# Patient Record
Sex: Male | Born: 1965 | Race: White | Hispanic: No | Marital: Single | Smoking: Former smoker
Health system: Southern US, Community
[De-identification: ages and names within clinical notes are randomized; demographics above are authoritative.]

## PROBLEM LIST (undated history)

## (undated) DIAGNOSIS — B019 Varicella without complication: Secondary | ICD-10-CM

## (undated) DIAGNOSIS — J45909 Unspecified asthma, uncomplicated: Secondary | ICD-10-CM

## (undated) HISTORY — DX: Varicella without complication: B01.9

---

## 2014-02-07 ENCOUNTER — Encounter (HOSPITAL_COMMUNITY): Payer: Self-pay | Admitting: Emergency Medicine

## 2014-02-07 ENCOUNTER — Emergency Department (HOSPITAL_COMMUNITY)
Admission: EM | Admit: 2014-02-07 | Discharge: 2014-02-07 | Disposition: A | Discharge status: Home / Self Care | Payer: BC Managed Care – PPO | Attending: Emergency Medicine | Admitting: Emergency Medicine

## 2014-02-07 DIAGNOSIS — M7989 Other specified soft tissue disorders: Secondary | ICD-10-CM

## 2014-02-07 DIAGNOSIS — L03119 Cellulitis of unspecified part of limb: Principal | ICD-10-CM

## 2014-02-07 DIAGNOSIS — L0390 Cellulitis, unspecified: Secondary | ICD-10-CM

## 2014-02-07 DIAGNOSIS — J45909 Unspecified asthma, uncomplicated: Secondary | ICD-10-CM | POA: Insufficient documentation

## 2014-02-07 DIAGNOSIS — Z87891 Personal history of nicotine dependence: Secondary | ICD-10-CM | POA: Insufficient documentation

## 2014-02-07 DIAGNOSIS — L02419 Cutaneous abscess of limb, unspecified: Secondary | ICD-10-CM | POA: Insufficient documentation

## 2014-02-07 HISTORY — DX: Unspecified asthma, uncomplicated: J45.909

## 2014-02-07 LAB — CBC WITH DIFFERENTIAL/PLATELET
BASOS ABS: 0 10*3/uL (ref 0.0–0.1)
BASOS PCT: 1 % (ref 0–1)
Eosinophils Absolute: 0.1 10*3/uL (ref 0.0–0.7)
Eosinophils Relative: 1 % (ref 0–5)
HEMATOCRIT: 32 % — AB (ref 39.0–52.0)
Hemoglobin: 10.3 g/dL — ABNORMAL LOW (ref 13.0–17.0)
LYMPHS PCT: 21 % (ref 12–46)
Lymphs Abs: 1.7 10*3/uL (ref 0.7–4.0)
MCH: 28.1 pg (ref 26.0–34.0)
MCHC: 32.2 g/dL (ref 30.0–36.0)
MCV: 87.2 fL (ref 78.0–100.0)
MONO ABS: 1 10*3/uL (ref 0.1–1.0)
Monocytes Relative: 12 % (ref 3–12)
Neutro Abs: 5.4 10*3/uL (ref 1.7–7.7)
Neutrophils Relative %: 65 % (ref 43–77)
Platelets: 229 10*3/uL (ref 150–400)
RBC: 3.67 MIL/uL — ABNORMAL LOW (ref 4.22–5.81)
RDW: 13.8 % (ref 11.5–15.5)
WBC: 8.3 10*3/uL (ref 4.0–10.5)

## 2014-02-07 LAB — BASIC METABOLIC PANEL
BUN: 25 mg/dL — ABNORMAL HIGH (ref 6–23)
CALCIUM: 9.2 mg/dL (ref 8.4–10.5)
CO2: 25 meq/L (ref 19–32)
CREATININE: 1.13 mg/dL (ref 0.50–1.35)
Chloride: 100 mEq/L (ref 96–112)
GFR calc Af Amer: 86 mL/min — ABNORMAL LOW (ref >90)
GFR calc non Af Amer: 74 mL/min — ABNORMAL LOW (ref >90)
Glucose, Bld: 97 mg/dL (ref 70–99)
Potassium: 3 mEq/L — ABNORMAL LOW (ref 3.7–5.3)
Sodium: 139 mEq/L (ref 137–147)

## 2014-02-07 LAB — I-STAT CG4 LACTIC ACID, ED: LACTIC ACID, VENOUS: 0.98 mmol/L (ref 0.5–2.2)

## 2014-02-07 MED ORDER — HYDROCODONE-ACETAMINOPHEN 5-325 MG PO TABS: 1.0000 | ORAL_TABLET | ORAL | Status: DC | PRN

## 2014-02-07 MED ORDER — SULFAMETHOXAZOLE-TMP DS 800-160 MG PO TABS: 1.0000 | ORAL_TABLET | Freq: Two times a day (BID) | ORAL | Status: DC

## 2014-02-07 MED ORDER — POTASSIUM CHLORIDE CRYS ER 20 MEQ PO TBCR
40.0000 meq | EXTENDED_RELEASE_TABLET | Freq: Once | ORAL | Status: AC
  Administered 2014-02-07: 40 meq via ORAL
  Filled 2014-02-07: qty 2

## 2014-02-07 MED ORDER — VANCOMYCIN HCL IN DEXTROSE 1-5 GM/200ML-% IV SOLN
1000.0000 mg | Freq: Once | INTRAVENOUS | Status: AC
  Administered 2014-02-07: 1000 mg via INTRAVENOUS
  Filled 2014-02-07: qty 200

## 2014-02-07 MED ORDER — SODIUM CHLORIDE 0.9 % IV BOLUS (SEPSIS)
1000.0000 mL | Freq: Once | INTRAVENOUS | Status: AC
  Administered 2014-02-07: 1000 mL via INTRAVENOUS

## 2014-02-07 MED ORDER — VANCOMYCIN HCL IN DEXTROSE 1-5 GM/200ML-% IV SOLN: 1000.0000 mg | Freq: Two times a day (BID) | INTRAVENOUS | Status: DC

## 2014-02-07 NOTE — ED Provider Notes (Signed)
TIME SEEN: 1:01 PM  CHIEF COMPLAINT: Left lower extremity cellulitis  HPI: Patient is a 48 year old male with history of asthma who presents emergency department with left lower extremity cellulitis. He is is redness and swelling to left leg for the past 2 days. He states that several days ago he had nausea, diarrhea and fever but this resolved. Denies a history of injury to the leg. Denies history of diabetes. No numbness, tingling or weakness. No fevers, chills, nausea, vomiting or diarrhea currently. He does not have a PCP.  ROS: See HPI Constitutional: no fever  Eyes: no drainage  ENT: no runny nose   Cardiovascular:  no chest pain  Resp: no SOB  GI: no vomiting GU: no dysuria Integumentary: no rash  Allergy: no hives  Musculoskeletal: no leg swelling  Neurological: no slurred speech ROS otherwise negative  PAST MEDICAL HISTORY/PAST SURGICAL HISTORY:  Past Medical History  Diagnosis Date  . Asthma     MEDICATIONS:  Prior to Admission medications   Not on File    ALLERGIES:  Allergies  Allergen Reactions  . Cortisone   . Novocain [Procaine]     SOCIAL HISTORY:  History  Substance Use Topics  . Smoking status: Former Smoker    Quit date: 02/08/2007  . Smokeless tobacco: Not on file  . Alcohol Use: No    FAMILY HISTORY: No family history on file.  EXAM: BP 112/90  Pulse 76  Temp(Src) 98 F (36.7 C) (Oral)  Resp 15  Ht 5' 1.5" (1.562 m)  Wt 204 lb (92.534 kg)  BMI 37.93 kg/m2  SpO2 100% CONSTITUTIONAL: Alert and oriented and responds appropriately to questions. Well-appearing; well-nourished HEAD: Normocephalic EYES: Conjunctivae clear, PERRL ENT: normal nose; no rhinorrhea; moist mucous membranes; pharynx without lesions noted NECK: Supple, no meningismus, no LAD  CARD: RRR; S1 and S2 appreciated; no murmurs, no clicks, no rubs, no gallops RESP: Normal chest excursion without splinting or tachypnea; breath sounds clear and equal bilaterally; no  wheezes, no rhonchi, no rales,  ABD/GI: Normal bowel sounds; non-distended; soft, non-tender, no rebound, no guarding BACK:  The back appears normal and is non-tender to palpation, there is no CVA tenderness EXT: Swelling of the left lower extremity with associated cellulitis from the left knee and left ankle with no joint effusion, 2+ DP pulses bilaterally, Normal ROM in all joints; otherwise extremities are non-tender to palpation; no edema; normal capillary refill; no cyanosis    SKIN: Normal color for age and race; warm, circumferential erythema and warmth to the left lower extremity from the knee to the ankle with no joint effusion NEURO: Moves all extremities equally PSYCH: The patient's mood and manner are appropriate. Grooming and personal hygiene are appropriate.  MEDICAL DECISION MAKING: Patient here with left lower extremity cellulitis. He is medically stable, nontoxic appearing. We'll obtain labs, lactate, blood cultures. We'll also obtain venous ultrasound to rule out DVT. Will give IV antibiotics. Patient reports that likely to be discharged home with oral antibiotics rather than be admitted if possible.  ED PROGRESS: Doppler shows no sign of DVT. Patient's labs are unremarkable. No leukocytosis. Lactate normal. Patient is still hemodynamically stable. We'll discharge home on Bactrim with return precautions and pain medication. Have given PCP followup information. Patient verbalizes understanding is comfortable with this plan. Have offered admission given the extent of the cellulitis but patient declines.     Layla MawKristen N Mikolaj Woolstenhulme, DO 02/07/14 1500

## 2014-02-07 NOTE — Progress Notes (Signed)
ANTIBIOTIC CONSULT NOTE - INITIAL  Pharmacy Consult for vancomycin Indication: cellulitis  Allergies  Allergen Reactions  . Novocain [Procaine] Other (See Comments)    Panic attack  . Cortisone Rash    Patient Measurements: Height: 5' 1.5" (156.2 cm) Weight: 204 lb (92.534 kg) IBW/kg (Calculated) : 53.45   Vital Signs: Temp: 98 F (36.7 C) (04/21 1220) Temp src: Oral (04/21 1220) BP: 118/68 mmHg (04/21 1418) Pulse Rate: 83 (04/21 1419) Intake/Output from previous day:   Intake/Output from this shift:    Labs:  Recent Labs  02/07/14 1256  WBC 8.3  HGB 10.3*  PLT 229  CREATININE 1.13   Estimated Creatinine Clearance: 76.4 ml/min (by C-G formula based on Cr of 1.13). No results found for this basename: VANCOTROUGH, VANCOPEAK, VANCORANDOM, GENTTROUGH, GENTPEAK, GENTRANDOM, TOBRATROUGH, TOBRAPEAK, TOBRARND, AMIKACINPEAK, AMIKACINTROU, AMIKACIN,  in the last 72 hours   Microbiology: No results found for this or any previous visit (from the past 720 hour(s)).  Medical History: Past Medical History  Diagnosis Date  . Asthma     Assessment: 48 yo M to start vancomycin per pharmacy for cellulitis of L leg.  Pt reports N/V x 2 days, increased redness/swelling to L leg.  WBC 8.3, creat 1.13, afebrile. Creat cl 76 ml/min.   Goal of Therapy:  Vancomycin trough level 10-15 mcg/ml  Plan:  Vancomycin 1 gm IV q12h Measure antibiotic drug levels at steady state Follow up culture results  Herby AbrahamMichelle T. Gurbani Figge, Pharm.D. [phone] 02/07/2014 2:44 PM

## 2014-02-07 NOTE — ED Notes (Signed)
Pt states he had been sick with N/V for 2 days.  Pt states today he had increased redness/swelling to the L leg.  Pt reports some soreness to the L leg.

## 2014-02-07 NOTE — Discharge Instructions (Signed)
Cellulitis °Cellulitis is an infection of the skin and the tissue beneath it. The infected area is usually red and tender. Cellulitis occurs most often in the arms and lower legs.  °CAUSES  °Cellulitis is caused by bacteria that enter the skin through cracks or cuts in the skin. The most common types of bacteria that cause cellulitis are Staphylococcus and Streptococcus. °SYMPTOMS  °· Redness and warmth. °· Swelling. °· Tenderness or pain. °· Fever. °DIAGNOSIS  °Your caregiver can usually determine what is wrong based on a physical exam. Blood tests may also be done. °TREATMENT  °Treatment usually involves taking an antibiotic medicine. °HOME CARE INSTRUCTIONS  °· Take your antibiotics as directed. Finish them even if you start to feel better. °· Keep the infected arm or leg elevated to reduce swelling. °· Apply a warm cloth to the affected area up to 4 times per day to relieve pain. °· Only take over-the-counter or prescription medicines for pain, discomfort, or fever as directed by your caregiver. °· Keep all follow-up appointments as directed by your caregiver. °SEEK MEDICAL CARE IF:  °· You notice red streaks coming from the infected area. °· Your red area gets larger or turns dark in color. °· Your bone or joint underneath the infected area becomes painful after the skin has healed. °· Your infection returns in the same area or another area. °· You notice a swollen bump in the infected area. °· You develop new symptoms. °SEEK IMMEDIATE MEDICAL CARE IF:  °· You have a fever. °· You feel very sleepy. °· You develop vomiting or diarrhea. °· You have a general ill feeling (malaise) with muscle aches and pains. °MAKE SURE YOU:  °· Understand these instructions. °· Will watch your condition. °· Will get help right away if you are not doing well or get worse. °Document Released: 07/16/2005 Document Revised: 04/06/2012 Document Reviewed: 12/22/2011 °ExitCare® Patient Information ©2014 Exit[employer] LLC. ° °Emergency  Department Resource Guide °1) Find a Doctor and Pay Out of Pocket °Although you won't have to find out who is covered by your insurance plan, it is a good idea to ask around and get recommendations. You will then need to call the office and see if the doctor you have chosen will accept you as a new patient and what types of options they offer for patients who are self-pay. Some doctors offer discounts or will set up payment plans for their patients who do not have insurance, but you will need to ask so you aren't surprised when you get to your appointment. ° °2) Contact Your Local Health Department °Not all health departments have doctors that can see patients for sick visits, but many do, so it is worth a call to see if yours does. If you don't know where your local health department is, you can check in your phone book. The CDC also has a tool to help you locate your state's health department, and many state websites also have listings of all of their local health departments. ° °3) Find a Walk-in Clinic °If your illness is not likely to be very severe or complicated, you may want to try a walk in clinic. These are popping up all over the country in pharmacies, drugstores, and shopping centers. They're usually staffed by nurse practitioners or physician assistants that have been trained to treat common illnesses and complaints. They're usually fairly quick and inexpensive. However, if you have serious medical issues or chronic medical problems, these are probably not your best option. ° °  best option.  No Primary Care Doctor: - Call Health Connect at  [phone] - they can help you locate a primary care doctor that  accepts your insurance, provides certain services, etc. - Physician Referral Service- [phone]  Chronic Pain Problems: Organization         Address  Phone   Notes  [facility]  [phone] Patients need to be referred by their primary care doctor.   Medication  Assistance: Organization         Address  Phone   Notes  [facility] Medication [facility] [address] [place], [place] [address] [phone] --Must be a resident of [facility] -- Must have NO insurance coverage whatsoever (no Medicaid/ Medicare, etc.) -- The pt. MUST have a primary care doctor that directs their care regularly and follows them in the community   MedAssist  [phone]   [employer]  [phone]    Agencies that provide inexpensive medical care: Organization         Address  Phone   Notes  [facility]  [phone]   [facility]    [phone]   [facility] [address] [place], [place] [address] [phone]   [facility] [place] [address], [place] [phone]   [facility]anned Parenthood    [phone]) [phone]   [facility]    [phone]   [facility]  [address], [place] Phone:  [phone], Fax:  [phone]) [phone] Hours of Operation:  9 am - 6 pm, M-F.  Also accepts Medicaid/Medicare and self-pay.  [facility]  [address], [place] Phone: [phone], Fax: [phone]. Hours of Operation:  8:30 am - 5:30 pm, M-F.  Also accepts Medicaid and self-pay.  [facility] [place] [address], [place] Phone: [phone]   [facility] [address], [place], [place] [phone], Ext. [phone] Mondays & Thursdays: 7-9 AM.  First 15 patients are seen on a first come, first serve basis.    Medicaid-accepting [facility] [place] Providers:  Organization         Address  Phone   Notes  [facility] [address], [place] [phone] Also accepts self-pay patients.  [facility] [address], [place]  [phone]   [facility] [address], [place]  [phone]) [phone]   [facility] [address] [place] [address], [place] [phone]   Renaye RakersVeita Bland [address], [place]   [phone] Only accepts [place] Access [place] patients after they have their name applied to their card.   Self-Pay (no insurance) in [facility] [place]:  Organization         Address  Phone   Notes  Sickle Cell Patients, [facility] [address], [place] [phone]) [phone]   [facility] [address], [place] [phone]) [phone]   [facility] [place] [place]  [address], [place] [phone]   [facility]/Dr. Osei-Bonsu  [address] [place] [address], [place] or [id number], [place] [phone] Phone number for both [place] and [place] locations is the same.  [facility] [address],  [place] [phone]) [phone]   [facility] [place] [address] [place] [address], [place] or [address] [phone]   [facility] [address], [place] [phone], phone; [phone]) [phone], fax Sees patients 1st and 3rd Saturday of every month.  Must not qualify for public or private insurance (i.e. Medicaid, Medicare, [place] Health Choice, Veterans' Benefits)  Household income should be no more than 200% of the poverty level The clinic cannot treat you if you are pregnant or think you are pregnant  Sexually transmitted diseases are not treated at the clinic.    Dental Care: Organization         Address  Phone  Notes  [facility] Department of [facility] [address], [place] [phone] Accepts children up to age 621 who are enrolled in [place]icaid or [place] Health Choice; pregnant women with a Medicaid card; and children who have applied for Medicaid or [place] Health Choice, but were declined, whose parents can pay a reduced fee at time of service.  [facility]  Department of [facility] [place]  [address], [place] [phone]) [phone] Accepts children up to age 50 who are enrolled in [place]icaid or [place] Health Choice; pregnant women with a Medicaid card; and children who have applied for Medicaid or [place] Health Choice, but were declined, whose parents can pay a reduced fee at time of service.  [facility]  [address], [place] [phone] Patients are seen by appointment only. Walk-ins are not accepted. [facility] will see patients 50 years of age and older. Monday - Tuesday (8am-5pm) Most Wednesdays (8:30-5pm) $30 per visit, cash only  [facility]  [address], [facility] [place] [phone] Patients are seen by appointment only. Walk-ins are not accepted. [facility] will see patients 50 years of age and older. One Wednesday Evening (Monthly: Volunteer Based).  $30 per visit, cash only  [employer]  [phone] for adults; Children under age 774, call Graduate Pediatric Dentistry at [phone]. Children aged 64-14, please call [phone] to request a pediatric application.  Dental services are provided in all areas of dental care including fillings, crowns and bridges, complete and partial dentures, implants, gum treatment, root canals, and extractions. Preventive care is also provided. Treatment is provided to both adults and children. Patients are selected via a lottery and there is often a waiting list.   [facility] [address], [place]  [phone] [email or web address]   [facility] [address], [place], [place] [phone], Ext. [phone] Second and Fourth Thursday of each month, opens at 6:30 AM; Clinic ends at 9 AM.  Patients are seen on a first-come first-served basis, and a limited number are seen during each clinic.   [facility]  [address], [place], [place] [phone]) [phone]    Eligibility Requirements You must have lived in [place], [place], or [place] counties for at least the last three months.   You cannot be eligible for state or federal sponsored National Cityhealthcare insurance, including CIGNAVeterans Administration, [place]icaid, or [employer] EntertainmentMedicare.   You generally cannot be eligible for healthcare insurance through your employer.    How to apply: Eligibility screenings are held every Tuesday and Wednesday afternoon from 1:00 pm until 4:00 pm. You do not need an appointment for the interview!  [facility] [address],  [place], [place] [phone]   [facility]  [phone]   [facility]  [phone]   [facility]  [phone]    Behavioral Health Resources in the Community: Intensive Outpatient Programs Organization         Address  Phone  Notes  [facility] [address], [place], [place] [phone]   [facility] [address], [place], [place] [phone]   [facility] [address], [place], [place]  [phone]   [facility] [address],  [place], [place] [phone] or [phone]   Substance Abuse Resources Organization         Address  Phone  Notes  [facility] Drug Services  [phone]   [facility]  [phone]   [facility]  [phone]   [facility]  [phone]   Residential & Outpatient Substance Abuse Program  [phone]   Psychological Services Organization         Address  Phone  Notes  [facility]  [phone]   [facility]  [phone]   [facility] [address], [place] [phone] or [phone]    Mobile Crisis Teams Organization         Address  Phone  Notes  [facility], Mobile Crisis Care Unit  [phone]   [facility]  [address]  [place], [place] [phone]   Doristine LocksSharon DeEsch [address] [place] [phone]    Self-Help/Support Groups Organization         Address  Phone             Notes  Mental Health Assoc. of  - variety of support groups  336- [phone] Call for more information  Narcotics Anonymous (NA), Caring [facility] [address], [place]  2 meetings at this location   Statisticianesidential Treatment Programs Organization         Address  Phone  Notes  [facility] [address],    [place]  [phone]   [facility]  [address], [place] [phone], [place], [place] [phone]   [facility] [address], [place] [phone] Admissions: 8am-3pm M-F  [facility] [address]    [place], [place] [phone]   [facility] [address], [place], [place] [phone]   [facility] [address]  [place], [place] [phone]   [facility] - Intensive Outpatient [address], [place], [place] [phone]   [facility] ([facility] [address]  [place], [place] [phone] or [phone]   [facility] (RTS) [address] [place], [place] [phone] Accepts Medicaid  [facility] [address]  [place] [phone] Substance Abuse/Addiction Treatment   [facility] Behavioral Health Resources Organization         Address  Phone  Notes  [facility]  [phone]   Angie FavaJulie Brannon, PhD [address], [address] [place], [place]   [phone](336) [phone] or [phone]   [facility]   [address] [place], [place] [phone](336) [phone]   [facility] [address], [place], [place] [phone] Insurance/Medicaid/sponsorship through [facility]  Faith and Families [address]  [address] [place], [place] [phone] [place], [place] [phone]    Dr. Adele Schilder  [phone]   [facility] Dept. 1) [address] [place] 2) [place] 3)  [place] [address], [place] [phone]  [phone]   [place] [phone] or [phone] (After Hours)

## 2014-02-07 NOTE — Discharge Planning (Signed)
P4CC Felicia E, KeyCorpCommunity Liaison  Spoke to patient about primary care resources and establishing care with a provider. Patient declined resource guide and help with establishing care with a PCP.

## 2014-02-07 NOTE — ED Notes (Signed)
Patient to be discharged after IV antibiotic is finished infusing.

## 2014-02-07 NOTE — Progress Notes (Signed)
*  PRELIMINARY RESULTS* Vascular Ultrasound Left lower extremity venous duplex has been completed.  Preliminary findings: No obvious evidence of DVT or baker's cyst.   Farrel DemarkJill Eunice, RDMS, RVT  02/07/2014, 1:46 PM

## 2014-02-13 LAB — CULTURE, BLOOD (ROUTINE X 2)
Culture: NO GROWTH
Culture: NO GROWTH

## 2015-11-21 ENCOUNTER — Ambulatory Visit (INDEPENDENT_AMBULATORY_CARE_PROVIDER_SITE_OTHER): Payer: 59 | Admitting: Podiatry

## 2015-11-21 ENCOUNTER — Encounter: Payer: Self-pay | Admitting: Podiatry

## 2015-11-21 ENCOUNTER — Ambulatory Visit (INDEPENDENT_AMBULATORY_CARE_PROVIDER_SITE_OTHER): Payer: 59

## 2015-11-21 VITALS — BP 165/90 | HR 74 | Resp 16

## 2015-11-21 DIAGNOSIS — M79673 Pain in unspecified foot: Secondary | ICD-10-CM

## 2015-11-21 DIAGNOSIS — M19071 Primary osteoarthritis, right ankle and foot: Secondary | ICD-10-CM | POA: Diagnosis not present

## 2015-11-21 DIAGNOSIS — M7661 Achilles tendinitis, right leg: Secondary | ICD-10-CM | POA: Diagnosis not present

## 2015-11-21 NOTE — Progress Notes (Signed)
   Subjective:    Patient ID: Johnathan Scott., male    DOB: 11-28-1965, 50 y.o.   MRN: [id number]  HPI: He presents today walking with 2 canes with a chief complaint of a painful right foot. He states this has been painful for the past 15 years and just seems to be getting worse. He denies any trauma that he can recall. He states that it feels like an ache and a burning that radiates up his legs occasionally sharp shooting pain if he moves the wrong way from the lateral aspect of his right foot. He takes 1000 mg of ibuprofen a day he says. He denies fever chills nausea vomiting muscle aches and pains. He states that he has been walking with an abnormal gait for so long. His left hip now hurts.    Review of Systems  Musculoskeletal: Positive for myalgias.  All other systems reviewed and are negative.      Objective:   Physical Exam: 50 year old white male vital signs stable alert and oriented 3 in no apparent distress presents Ambulating with a cane to the right hand. Pulses are strongly palpable neurologic sensorium is intact per Semmes-Weinstein monofilament. Deep tendon reflexes are intact bilateral and muscle strength +5 over 5 dorsiflexion plantar flexors and inverters everters all into the musculature is intact. Orthopedic evaluation was resolved as distal to the ankle range of motion without crepitation. Cutaneous evaluation demonstrates supple well-hydrated cutis no open lesions or wounds. He has good range of motion at the ankle joint. 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 He has pain on inversion against resistance and the subtalar joint is very stiff and compared to the contralateral foot. An os trigonum is also present.        Assessment & Plan:  Assessment: Subtalar joint capsulitis right.  Plan: Discussed etiology pathology conservative versus  surgical therapies. I discussed with him in great detail today subtalar joint fusion however this is not something that he is actively looking forward to particularly stating that he cannot take time off work to allow this to heal. At this point I have requested a CT scan of his right foot to better evaluate the subtalar joint for surgical consideration.

## 2015-11-22 ENCOUNTER — Telehealth: Payer: Self-pay | Admitting: *Deleted

## 2015-11-22 DIAGNOSIS — M19071 Primary osteoarthritis, right ankle and foot: Secondary | ICD-10-CM

## 2015-11-22 NOTE — Telephone Encounter (Addendum)
Dr. Al [place] [address] of right foot without contrast.  [employer] [phone] [facility] STATES PT INSURANCE REQUIRES PRIOR AUTHORIZATION BE PERFORMED BY [facility] [phone].  [facility] STATES NEEDS CLINICALS FAXED TO [phone] FOR CASE #[id number].  FAXED ORDERS, CLINICALS AND PT DEMOGRAPHICS TO [phone].  11/23/2015-[facility]/HEALTHCARE APPROVED [phone], FOR RIGHT FOOT REFERENCE #[id number], VALID 11/23/2015 TO 02/21/2016.  FAXED TO [facility].  11/23/2015-Mary Darcella Gasman states pt would like to change the location of his CT Scan to [facility] [phone], fax [phone].  I called [employer] [phone] and confirmed that Case #[id number] was still valid 11/23/2015 to 02/21/2016, and it is still valid.  Faxed orders for CT right foot with out contrast and letter of confirmation to [facility] [phone].  11/26/2015-[facility], asked if we called and pt states he was to have CT and MRI.  I told Maureen Ralphs that pt was only scheduled for CT with [facility] as requested by Chales Abrahams - [employer].  I gave [facility] contact line to get appt.  11/28/2015-FAXED REQUEST FOR COPY OF MRI DISC TO [facility] [phone].  12/04/2015-COPY OF CT DISC mailed to [facility].

## 2015-11-26 ENCOUNTER — Telehealth: Payer: Self-pay | Admitting: Podiatry

## 2015-11-28 ENCOUNTER — Encounter: Payer: Self-pay | Admitting: Podiatry

## 2015-11-28 DIAGNOSIS — M19071 Primary osteoarthritis, right ankle and foot: Secondary | ICD-10-CM

## 2015-11-28 DIAGNOSIS — M7661 Achilles tendinitis, right leg: Secondary | ICD-10-CM

## 2015-12-05 MED ORDER — TRAMADOL HCL 50 MG PO TABS: 50.0000 mg | ORAL_TABLET | Freq: Four times a day (QID) | ORAL | Status: DC | PRN

## 2015-12-05 NOTE — Addendum Note (Signed)
Addended by: Hadley Pen R on: 12/05/2015 04:43 PM   Modules accepted: Orders

## 2015-12-07 NOTE — Telephone Encounter (Signed)
Patient brought by copy of CT and report-Dr [facility] requested and [facility] [place] at [facility]. Sent disc out today. Will contact patient once we receive report.

## 2015-12-19 ENCOUNTER — Encounter: Payer: Self-pay | Admitting: Podiatry

## 2015-12-19 ENCOUNTER — Ambulatory Visit (INDEPENDENT_AMBULATORY_CARE_PROVIDER_SITE_OTHER): Payer: 59 | Admitting: Podiatry

## 2015-12-19 VITALS — BP 144/83 | HR 75 | Resp 16

## 2015-12-19 DIAGNOSIS — M19071 Primary osteoarthritis, right ankle and foot: Secondary | ICD-10-CM

## 2015-12-19 DIAGNOSIS — M7661 Achilles tendinitis, right leg: Secondary | ICD-10-CM | POA: Diagnosis not present

## 2015-12-19 MED ORDER — TRAMADOL HCL 50 MG PO TABS: 50.0000 mg | ORAL_TABLET | Freq: Four times a day (QID) | ORAL | Status: DC | PRN

## 2015-12-19 NOTE — Patient Instructions (Signed)
Pre-Operative Instructions  Congratulations, you have decided to take an important step to improving your quality of life.  You can be assured that the doctors of [facility] will be with you every step of the way.  1. Plan to be at the surgery center/hospital at least 1 (one) hour prior to your scheduled time unless otherwise directed by the surgical center/hospital staff.  You must have a responsible adult accompany you, remain during the surgery and drive you home.  Make sure you have directions to the surgical center/hospital and know how to get there on time. 2. For hospital based surgery you will need to obtain a history and physical form from your family physician within 1 month prior to the date of surgery- we will give you a form for you primary physician.  3. We make every effort to accommodate the date you request for surgery.  There are however, times where surgery dates or times have to be moved.  We will contact you as soon as possible if a change in schedule is required.   4. No Aspirin/Ibuprofen for one week before surgery.  If you are on aspirin, any non-steroidal anti-inflammatory medications (Mobic, Aleve, Ibuprofen) you should stop taking it 7 days prior to your surgery.  You make take Tylenol  For pain prior to surgery.  5. Medications- If you are taking daily heart and blood pressure medications, seizure, reflux, allergy, asthma, anxiety, pain or diabetes medications, make sure the surgery center/hospital is aware before the day of surgery so they may notify you which medications to take or avoid the day of surgery. 6. No food or drink after midnight the night before surgery unless directed otherwise by surgical center/hospital staff. 7. No alcoholic beverages 24 hours prior to surgery.  No smoking 24 hours prior to or 24 hours after surgery. 8. Wear loose pants or shorts- loose enough to fit over bandages, boots, and casts. 9. No slip on shoes, sneakers are best. 10. Bring  your boot with you to the surgery center/hospital.  Also bring crutches or a walker if your physician has prescribed it for you.  If you do not have this equipment, it will be provided for you after surgery. 11. If you have not been contracted by the surgery center/hospital by the day before your surgery, call to confirm the date and time of your surgery. 12. Leave-time from work may vary depending on the type of surgery you have.  Appropriate arrangements should be made prior to surgery with your employer. 13. Prescriptions will be provided immediately following surgery by your doctor.  Have these filled as soon as possible after surgery and take the medication as directed. 14. Remove nail polish on the operative foot. 15. Wash the night before surgery.  The night before surgery wash the foot and leg well with the antibacterial soap provided and water paying special attention to beneath the toenails and in between the toes.  Rinse thoroughly with water and dry well with a towel.  Perform this wash unless told not to do so by your physician.  Enclosed: 1 Ice pack (please put in freezer the night before surgery)   1 Hibiclens skin cleaner   Pre-op Instructions  If you have any questions regarding the instructions, do not hesitate to call our office.  [place]: [address] [place], [place] [address] [phone]  [place]: [address] [place], [place] [address] [phone]  [place]: [address]  [place], [place] [address] [phone]  Dr. Richard   Tuchman DPM, Dr. Norman Regal DPM Dr. Richard Sikora DPM, Dr. M. Todd Hyatt DPM, Dr. Kathryn Egerton DPM 

## 2015-12-19 NOTE — Progress Notes (Signed)
He presents today for follow-up of his MRI report. He states that my right foot is still hurting and going have to consider some type of procedure. Is starting to make my hips or sabbatical hardly walk.  Objective: Vital signs are stable he is alert and oriented 3. He presents today walking with 2 canes one in each hand. Pulses are palpable and capillary fill time is normal. He has pain on end range of motion of the subtalar joint. MRI does state subtalar joint capsulitis. I had this over read the radiologist and also states, and allergy of subtalar joint capsulitis with osteoarthritic change.  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  Plan: Discussed etiology pathology conservative versus surgical therapies. At this point I recommended highly that he follow-up with orthopedics for his hips. I also recommended that he follow-up with her primary care provider for general health physical. At this point we consented him today for a subtalar joint arthrodesis of his right foot after a lengthy discussion. He understands that he will be in a cast afterwards he will be nonweightbearing status.  We went over the consent form line by line number by number giving him ample time to ask C softer regarding these procedures I answered them to the best of my ability in layman's terms he understands that it was amenable to inside O the consent form. I will follow-up with him in the near future for surgery however I do feel that he needs to follow-up with orthopedics for his hips and primary care for a physical prior to surgery.

## 2015-12-25 ENCOUNTER — Telehealth: Payer: Self-pay | Admitting: *Deleted

## 2015-12-25 NOTE — Telephone Encounter (Signed)
I'm calling on behalf of Dr. Al CorpusHyatt.  He wanted me to let you know he wants you to have a physical prior to having surgery.  He said he also wants you to have your hips checked as well because he wants you to be able to walk.  He doesn't want you to have 2 separate issues going on.  Would you like to schedule a date now or wait until you have this done?  "It's already scheduled.  He said he was going to do it sometime in May."  Okay he didn't give me a date.  When would you like to do it in May?  "Let's do it at the end of May.  I'll have everything done by then."  He can do it May 26th.  "That date will be fine."

## 2016-01-17 ENCOUNTER — Ambulatory Visit (INDEPENDENT_AMBULATORY_CARE_PROVIDER_SITE_OTHER): Payer: Managed Care, Other (non HMO) | Admitting: Family Medicine

## 2016-01-17 ENCOUNTER — Encounter: Payer: Self-pay | Admitting: Family Medicine

## 2016-01-17 VITALS — BP 130/72 | HR 74 | Temp 97.6°F | Ht 62.0 in | Wt 218.4 lb

## 2016-01-17 DIAGNOSIS — R002 Palpitations: Secondary | ICD-10-CM | POA: Diagnosis not present

## 2016-01-17 DIAGNOSIS — R600 Localized edema: Secondary | ICD-10-CM | POA: Diagnosis not present

## 2016-01-17 DIAGNOSIS — F410 Panic disorder [episodic paroxysmal anxiety] without agoraphobia: Secondary | ICD-10-CM | POA: Diagnosis not present

## 2016-01-17 LAB — COMPREHENSIVE METABOLIC PANEL
ALK PHOS: 77 U/L (ref 39–117)
ALT: 13 U/L (ref 0–53)
AST: 19 U/L (ref 0–37)
Albumin: 4.2 g/dL (ref 3.5–5.2)
BUN: 28 mg/dL — ABNORMAL HIGH (ref 6–23)
CALCIUM: 9.5 mg/dL (ref 8.4–10.5)
CO2: 30 meq/L (ref 19–32)
Chloride: 104 mEq/L (ref 96–112)
Creatinine, Ser: 0.93 mg/dL (ref 0.40–1.50)
GFR: 90.68 mL/min (ref >60.00)
GLUCOSE: 94 mg/dL (ref 70–99)
POTASSIUM: 4.2 meq/L (ref 3.5–5.1)
Sodium: 138 mEq/L (ref 135–145)
TOTAL PROTEIN: 7.8 g/dL (ref 6.0–8.3)
Total Bilirubin: 0.2 mg/dL (ref 0.2–1.2)

## 2016-01-17 LAB — CBC
HCT: 33.7 % — ABNORMAL LOW (ref 39.0–52.0)
Hemoglobin: 10.9 g/dL — ABNORMAL LOW (ref 13.0–17.0)
MCHC: 32.4 g/dL (ref 30.0–36.0)
MCV: 83.8 fl (ref 78.0–100.0)
PLATELETS: 255 10*3/uL (ref 150.0–400.0)
RBC: 4.01 Mil/uL — ABNORMAL LOW (ref 4.22–5.81)
RDW: 14.6 % (ref 11.5–15.5)
WBC: 6.8 10*3/uL (ref 4.0–10.5)

## 2016-01-17 LAB — TSH: TSH: 1.5 u[IU]/mL (ref 0.35–4.50)

## 2016-01-17 LAB — BRAIN NATRIURETIC PEPTIDE: PRO B NATRI PEPTIDE: 45 pg/mL (ref 0.0–100.0)

## 2016-01-17 NOTE — Progress Notes (Signed)
Patient ID: Johnathan Garner., male   DOB: February 06, 1966, 50 y.o.   MRN: [id number]  Johnathan Rumps, MD Phone: [phone]  Johnathan Oxley. is a 50 y.o. male who presents today for new patient visit.  Patient reports his podiatrist is considering doing surgery on his feet and the patient needs a physical prior to surgery. The patient is not very verbose when discussing his medical issues. He noted repeatedly during the encounter that he was good and I could just clear him for surgery. He notes he does have heartburn that goes away when he burps. No chest pressure or tightness. No shortness of breath. Occasionally he notes his heart will skip a beat. He notes both feet swell and do go down at night. Denies orthopnea and PND. Has no history of cardiac disease. No history of hyperlipidemia, hypertension, or diabetes. Does note a history of panic attacks with which he would have a sensation of needing to breathe quicker though this would resolve quickly and has not occurred recently. Is unable to do much physical activity given the pain in his feet. Unable to tell me if he could walk a block. Unable to tell me if he could walk up a flight of stairs.  Active Ambulatory Problems    Diagnosis Date Noted  . Bilateral lower extremity edema 01/20/2016  . Palpitations 01/20/2016  . Panic attacks 01/20/2016   Resolved Ambulatory Problems    Diagnosis Date Noted  . No Resolved Ambulatory Problems   Past Medical History  Diagnosis Date  . Asthma   . Chickenpox     Family History  Problem Relation Age of Onset  . Breast cancer Mother     Social History   Social History  . Marital Status: Single    Spouse Name: N/A  . Number of Children: N/A  . Years of Education: N/A   Occupational History  . Not on file.   Social History Main Topics  . Smoking status: Former Smoker    Quit date: 02/08/2007  . Smokeless tobacco: Not on file  . Alcohol Use: No  . Drug Use: No  . Sexual Activity: Not  on file   Other Topics Concern  . Not on file   Social History Narrative    ROS   General:  Negative for nexplained weight loss, fever Skin: Negative for new or changing mole, sore that won't heal HEENT: Negative for trouble hearing, trouble seeing, ringing in ears, mouth sores, hoarseness, change in voice, dysphagia. CV:  Positive for edema, Negative for chest pain, dyspnea, palpitations Resp: Negative for cough, dyspnea, hemoptysis GI: Negative for nausea, vomiting, diarrhea, constipation, abdominal pain, melena, hematochezia. GU: Negative for dysuria, incontinence, urinary hesitance, hematuria, vaginal or penile discharge, polyuria, sexual difficulty, lumps in testicle or breasts MSK: Positive for muscle cramps or aches, joint pain or swelling Neuro: Negative for headaches, weakness, numbness, dizziness, passing out/fainting Psych: Negative for depression, anxiety, memory problems  Objective  Physical Exam Filed Vitals:   01/17/16 1431  BP: 130/72  Pulse: 74  Temp: 97.6 F (36.4 C)    BP Readings from Last 3 Encounters:  01/17/16 130/72  12/19/15 144/83  11/21/15 165/90   Wt Readings from Last 3 Encounters:  01/17/16 218 lb 6.4 oz (99.066 kg)  02/07/14 204 lb (92.534 kg)    Physical Exam  Constitutional: No distress.  HENT:  Head: Normocephalic and atraumatic.  Right Ear: External ear normal.  Left Ear: External ear normal.  Mouth/Throat: Oropharynx is clear and  moist. No oropharyngeal exudate.  Eyes: Conjunctivae are normal. Pupils are equal, round, and reactive to light.  Neck: Neck supple.  Cardiovascular: Normal rate, regular rhythm and normal heart sounds.  Exam reveals no gallop and no friction rub.   No murmur heard. Pulmonary/Chest: Effort normal and breath sounds normal. No respiratory distress. He has no wheezes. He has no rales.  Abdominal: Soft. Bowel sounds are normal. He exhibits no distension. There is no tenderness. There is no rebound and no  guarding.  Musculoskeletal:  1+ pitting edema bilateral lower extremities  Lymphadenopathy:    He has no cervical adenopathy.  Neurological: He is alert.  Walks with a limp and assisted with a cane in each hand  Skin: Skin is warm and dry. He is not diaphoretic.  Psychiatric: Mood and affect normal.   EKG: Normal sinus rhythm, rate 71, no ischemic changes  Assessment/Plan:   Bilateral lower extremity edema Patient 1+ pitting edema on exam. No symptoms of CHF. Could be related to venous stasis given it improves at night. EKG is reassuring. We will order lab work to evaluate for further cause. May need workup for diastolic heart failure prior to having surgery given the swelling.  Palpitations Infrequent. Suspect probable PVCs given brief duration. EKG is reassuring. We'll continue to monitor. May need to consider cardiac evaluation prior to surgery.  Panic attacks None recently. Not a significant issue for the patient at this time. We'll continue to monitor for recurrence.    Orders Placed This Encounter  Procedures  . CBC  . Comp Met (CMET)  . TSH  . B Nat Peptide  . EKG 12-Lead    No orders of the defined types were placed in this encounter.     Johnathan Rumps, MD [place]

## 2016-01-17 NOTE — Progress Notes (Signed)
Pre visit review using our clinic review tool, if applicable. No additional management support is needed unless otherwise documented below in the visit note. 

## 2016-01-17 NOTE — Patient Instructions (Signed)
Nice to meet you. We will need to obtain some lab work to evaluate for chronic medical issues prior to discussing surgery.  If you develop chest pain, shortness of breath, palpitations, or any new or change in symptoms please seek medical attention.

## 2016-01-20 ENCOUNTER — Encounter: Payer: Self-pay | Admitting: Family Medicine

## 2016-01-20 DIAGNOSIS — F410 Panic disorder [episodic paroxysmal anxiety] without agoraphobia: Secondary | ICD-10-CM | POA: Insufficient documentation

## 2016-01-20 DIAGNOSIS — R002 Palpitations: Secondary | ICD-10-CM | POA: Insufficient documentation

## 2016-01-20 DIAGNOSIS — R600 Localized edema: Secondary | ICD-10-CM | POA: Insufficient documentation

## 2016-01-20 NOTE — Assessment & Plan Note (Signed)
Infrequent. Suspect probable PVCs given brief duration. EKG is reassuring. We'll continue to monitor. May need to consider cardiac evaluation prior to surgery.

## 2016-01-20 NOTE — Assessment & Plan Note (Signed)
Patient 1+ pitting edema on exam. No symptoms of CHF. Could be related to venous stasis given it improves at night. EKG is reassuring. We will order lab work to evaluate for further cause. May need workup for diastolic heart failure prior to having surgery given the swelling.

## 2016-01-20 NOTE — Assessment & Plan Note (Signed)
None recently. Not a significant issue for the patient at this time. We'll continue to monitor for recurrence.

## 2016-01-22 ENCOUNTER — Telehealth: Payer: Self-pay | Admitting: *Deleted

## 2016-01-22 MED ORDER — TRAMADOL HCL 50 MG PO TABS: 50.0000 mg | ORAL_TABLET | Freq: Four times a day (QID) | ORAL | Status: DC | PRN

## 2016-01-22 NOTE — Telephone Encounter (Signed)
Fax refill request for Tramadol.  Dr. Al CorpusHyatt states refill as previously.  Done.

## 2016-01-24 ENCOUNTER — Other Ambulatory Visit: Payer: Self-pay | Admitting: Family Medicine

## 2016-01-24 DIAGNOSIS — D649 Anemia, unspecified: Secondary | ICD-10-CM

## 2016-01-25 ENCOUNTER — Other Ambulatory Visit (INDEPENDENT_AMBULATORY_CARE_PROVIDER_SITE_OTHER): Payer: Managed Care, Other (non HMO)

## 2016-01-25 DIAGNOSIS — D649 Anemia, unspecified: Secondary | ICD-10-CM

## 2016-01-25 LAB — CBC WITH DIFFERENTIAL/PLATELET
BASOS ABS: 65 {cells}/uL (ref 0–200)
Basophils Relative: 1 %
EOS PCT: 5 %
Eosinophils Absolute: 325 cells/uL (ref 15–500)
HEMATOCRIT: 33.8 % — AB (ref 38.5–50.0)
HEMOGLOBIN: 10.7 g/dL — AB (ref 13.2–17.1)
LYMPHS ABS: 2210 {cells}/uL (ref 850–3900)
Lymphocytes Relative: 34 %
MCH: 26.6 pg — AB (ref 27.0–33.0)
MCHC: 31.7 g/dL — AB (ref 32.0–36.0)
MCV: 84.1 fL (ref 80.0–100.0)
MPV: 9.7 fL (ref 7.5–12.5)
Monocytes Absolute: 390 cells/uL (ref 200–950)
Monocytes Relative: 6 %
NEUTROS PCT: 54 %
Neutro Abs: 3510 cells/uL (ref 1500–7800)
Platelets: 265 10*3/uL (ref 140–400)
RBC: 4.02 MIL/uL — ABNORMAL LOW (ref 4.20–5.80)
RDW: 14.7 % (ref 11.0–15.0)
WBC: 6.5 10*3/uL (ref 3.8–10.8)

## 2016-01-25 LAB — IRON AND TIBC
%SAT: 10 % — AB (ref 15–60)
IRON: 34 ug/dL — AB (ref 50–180)
TIBC: 329 ug/dL (ref 250–425)
UIBC: 295 ug/dL (ref 125–400)

## 2016-01-25 LAB — VITAMIN B12: VITAMIN B 12: 307 pg/mL (ref 200–1100)

## 2016-01-25 LAB — FOLATE: FOLATE: 15.1 ng/mL (ref >5.4)

## 2016-01-25 LAB — FERRITIN: Ferritin: 30 ng/mL (ref 20–380)

## 2016-01-29 ENCOUNTER — Other Ambulatory Visit: Payer: Self-pay | Admitting: Family Medicine

## 2016-01-29 MED ORDER — FERROUS SULFATE 325 (65 FE) MG PO TABS: 325.0000 mg | ORAL_TABLET | Freq: Three times a day (TID) | ORAL | Status: DC

## 2016-01-31 ENCOUNTER — Telehealth: Payer: Self-pay | Admitting: *Deleted

## 2016-01-31 DIAGNOSIS — D649 Anemia, unspecified: Secondary | ICD-10-CM

## 2016-01-31 NOTE — Telephone Encounter (Signed)
Pt came by office yesterday & dropped off ifob (no name & dob was on the outside). I confirmed that it was him. Please place "future order" for ifob.

## 2016-01-31 NOTE — Telephone Encounter (Signed)
Order placed

## 2016-02-06 ENCOUNTER — Other Ambulatory Visit (INDEPENDENT_AMBULATORY_CARE_PROVIDER_SITE_OTHER): Payer: Managed Care, Other (non HMO)

## 2016-02-06 DIAGNOSIS — D649 Anemia, unspecified: Secondary | ICD-10-CM

## 2016-02-06 LAB — FECAL OCCULT BLOOD, IMMUNOCHEMICAL: FECAL OCCULT BLD: NEGATIVE

## 2016-02-20 ENCOUNTER — Encounter: Payer: Self-pay | Admitting: Family Medicine

## 2016-02-20 ENCOUNTER — Ambulatory Visit (INDEPENDENT_AMBULATORY_CARE_PROVIDER_SITE_OTHER): Payer: Managed Care, Other (non HMO) | Admitting: Family Medicine

## 2016-02-20 VITALS — BP 136/84 | HR 70 | Temp 98.2°F | Ht 62.0 in | Wt 219.8 lb

## 2016-02-20 DIAGNOSIS — R002 Palpitations: Secondary | ICD-10-CM | POA: Diagnosis not present

## 2016-02-20 DIAGNOSIS — R600 Localized edema: Secondary | ICD-10-CM | POA: Diagnosis not present

## 2016-02-20 DIAGNOSIS — D649 Anemia, unspecified: Secondary | ICD-10-CM

## 2016-02-20 NOTE — Progress Notes (Signed)
Pre visit review using our clinic review tool, if applicable. No additional management support is needed unless otherwise documented below in the visit note. 

## 2016-02-20 NOTE — Assessment & Plan Note (Signed)
No recurrence. Had negative lab workup. EKG reassuring previously. Obtaining an echo given lower extremity edema and history of palpitations given upcoming surgery. We'll continue to monitor.

## 2016-02-20 NOTE — Progress Notes (Signed)
Patient ID: Johnathan Glazier., male   DOB: [birth year], 50 y.o.   MRN: [id number]  Johnathan Alar, MD Phone: [phone]  Gaetano Romberger. is a 50 y.o. male who presents today for follow-up.  Anemia: Found on prior lab work. No prior history of anemia. Denies bleeding from anywhere. Negative FOBT. Started taking the iron supplement. Stating 3 times daily. Minimal constipation. No palpitations. No chest pain. No shortness breath. No fatigue.  Palpitations: patient has had these in the past. Last briefly. No recurrence since her last visit.  Lower external swelling: Patient notes swelling is still there. Worse at the end of the day. No orthopnea or PND. He notes some discomfort in his legs and they swell. It is bilateral in nature. He does have anemia. Other lab work for causes unremarkable.  Patient reports he is due to have right ankle surgery and have a pin inserted by podiatry. States he has arthritis in his right ankle. He is taking tramadol as needed for this. Review of CT scan reveals an os trigonum and mild degenerative changes.  PMH: Former smoker   ROS see history of present illness  Objective  Physical Exam Filed Vitals:   02/20/16 1525  BP: 136/84  Pulse: 70  Temp: 98.2 F (36.8 C)    BP Readings from Last 3 Encounters:  02/20/16 136/84  01/17/16 130/72  12/19/15 144/83   Wt Readings from Last 3 Encounters:  02/20/16 219 lb 12.8 oz (99.701 kg)  01/17/16 218 lb 6.4 oz (99.066 kg)  02/07/14 204 lb (92.534 kg)    Physical Exam  Constitutional: He is well-developed, well-nourished, and in no distress.  HENT:  Head: Normocephalic and atraumatic.  Cardiovascular: Normal rate, regular rhythm and normal heart sounds.   1+ pitting edema  Pulmonary/Chest: Effort normal and breath sounds normal.  Neurological: He is alert.  Skin: Skin is warm and dry. He is not diaphoretic.     Assessment/Plan: Please see individual problem list.  Palpitations No  recurrence. Had negative lab workup. EKG reassuring previously. Obtaining an echo given lower extremity edema and history of palpitations given upcoming surgery. We'll continue to monitor.  Bilateral lower extremity edema Remains present. No symptoms of CHF other than swelling. EKG was reassuring. BNP was in the normal range. It is anemic and this could be contributing. Other lab work unremarkable. Could be venous insufficiency. Given that he is going to end up having surgery will obtain an echo to rule out heart failure as a cause of his swelling.  Anemia Asymptomatic iron deficiency anemia. Negative stool studies. Tolerating iron supplements. We will recheck CBC.  Right ankle osteoarthritis: I discussed CT findings with the patient. Patient would like to move forward with surgery. I discussed needing to obtain an echo to ensure that there are no cardiac abnormalities given his lower extremity edema. I also discussed that it may be reasonable to obtain a second opinion given his CT findings with orthopedics though he declined this. He'll continue to follow with podiatry.  Orders Placed This Encounter  Procedures  . CBC  . ECHOCARDIOGRAM COMPLETE    Standing Status: Future     Number of Occurrences:      Standing Expiration Date: 05/22/2017    Order Specific Question:  Where should this test be performed    Answer:  [facility]    Order Specific Question:  Complete or Limited study?    Answer:  Complete    Order Specific Question:  With Image Enhancing Agent  or without Image Enhancing Agent?    Answer:  With Image Enhancing Agent    Order Specific Question:  Expected Date:    Answer:  1 week    Order Specific Question:  Reason for exam-Echo    Answer:  Preoperative evaluation    Johnathan AlarEric Sherri Mcarthy, MD [facility]

## 2016-02-20 NOTE — Patient Instructions (Addendum)
Nice to see you. We are going to recheck your blood counts to see if your anemia has improved. We are going to obtain an echo to evaluate your heart given your swelling. If you develop chest pain, shortness of breath, palpitations, worsening swelling, or any new or changing symptoms please seek medical attention.

## 2016-02-20 NOTE — Assessment & Plan Note (Signed)
Asymptomatic iron deficiency anemia. Negative stool studies. Tolerating iron supplements. We will recheck CBC.

## 2016-02-20 NOTE — Assessment & Plan Note (Signed)
Remains present. No symptoms of CHF other than swelling. EKG was reassuring. BNP was in the normal range. It is anemic and this could be contributing. Other lab work unremarkable. Could be venous insufficiency. Given that he is going to end up having surgery will obtain an echo to rule out heart failure as a cause of his swelling.

## 2016-02-21 LAB — CBC
HEMATOCRIT: 34 % — AB (ref 39.0–52.0)
HEMOGLOBIN: 11.1 g/dL — AB (ref 13.0–17.0)
MCHC: 32.7 g/dL (ref 30.0–36.0)
MCV: 84.3 fl (ref 78.0–100.0)
Platelets: 249 10*3/uL (ref 150.0–400.0)
RBC: 4.04 Mil/uL — ABNORMAL LOW (ref 4.22–5.81)
RDW: 14.5 % (ref 11.5–15.5)
WBC: 7.4 10*3/uL (ref 4.0–10.5)

## 2016-02-26 ENCOUNTER — Other Ambulatory Visit: Payer: Self-pay | Admitting: Family Medicine

## 2016-02-26 DIAGNOSIS — Z87898 Personal history of other specified conditions: Secondary | ICD-10-CM

## 2016-02-26 DIAGNOSIS — R600 Localized edema: Secondary | ICD-10-CM

## 2016-02-28 ENCOUNTER — Telehealth: Payer: Self-pay | Admitting: *Deleted

## 2016-02-28 NOTE — Telephone Encounter (Signed)
I'm calling to see if you got your physical and had your hips checked.  "I had a physical.  I have to have a test to check my heart.  It's scheduled for the 18th.  I am anemic too so he's monitoring that."  You're scheduled for surgery on May 26.  Would you like to reschedule?  No, just leave it where it is and we'll see how my test goes."  Did you have your hips checked?  "No, my problem is my feet not my hips.  What's that got to do with my surgery?"  Well Dr. Al CorpusHyatt wants to make sure you will be able to walk and get around.   "I don't think it's necessary but I'll mention it to my doctor when I see him."

## 2016-03-06 ENCOUNTER — Telehealth: Payer: Self-pay | Admitting: Family Medicine

## 2016-03-06 ENCOUNTER — Other Ambulatory Visit: Payer: Self-pay

## 2016-03-06 ENCOUNTER — Ambulatory Visit (INDEPENDENT_AMBULATORY_CARE_PROVIDER_SITE_OTHER): Payer: Managed Care, Other (non HMO)

## 2016-03-06 ENCOUNTER — Other Ambulatory Visit: Payer: Managed Care, Other (non HMO)

## 2016-03-06 DIAGNOSIS — Z8679 Personal history of other diseases of the circulatory system: Secondary | ICD-10-CM | POA: Diagnosis not present

## 2016-03-06 DIAGNOSIS — R600 Localized edema: Secondary | ICD-10-CM | POA: Diagnosis not present

## 2016-03-06 DIAGNOSIS — Z87898 Personal history of other specified conditions: Secondary | ICD-10-CM

## 2016-03-06 NOTE — Telephone Encounter (Signed)
Pt called about his lab results. Please call his cell phone.

## 2016-03-06 NOTE — Telephone Encounter (Signed)
Spoke with patient., see result note. Thanks! 

## 2016-03-10 ENCOUNTER — Telehealth: Payer: Self-pay | Admitting: *Deleted

## 2016-03-10 NOTE — Telephone Encounter (Signed)
I'm calling to check on the status of medical clearance.  "I had a test done on my heart on Thursday.  I'm waiting on the results of that."  Okay, when you get your results get them to let us know if you are cleared.  If we don't receive it by Thursday, we'll have to reschedule your surgery.  "I understand."  I'll let Dr. Al CorpusHyatt know.  "Okay, thank you."

## 2016-03-11 ENCOUNTER — Telehealth: Payer: Self-pay | Admitting: *Deleted

## 2016-03-11 ENCOUNTER — Encounter: Payer: Self-pay | Admitting: Family Medicine

## 2016-03-12 ENCOUNTER — Other Ambulatory Visit: Payer: Self-pay | Admitting: Podiatry

## 2016-03-12 MED ORDER — HYDROMORPHONE HCL 4 MG PO TABS: 4.0000 mg | ORAL_TABLET | Freq: Four times a day (QID) | ORAL | Status: DC | PRN

## 2016-03-12 MED ORDER — TRAMADOL HCL 50 MG PO TABS: 50.0000 mg | ORAL_TABLET | Freq: Four times a day (QID) | ORAL | Status: DC | PRN

## 2016-03-12 MED ORDER — ONDANSETRON HCL 4 MG PO TABS: 4.0000 mg | ORAL_TABLET | Freq: Three times a day (TID) | ORAL | Status: DC | PRN

## 2016-03-12 MED ORDER — CEPHALEXIN 500 MG PO CAPS: 500.0000 mg | ORAL_CAPSULE | Freq: Three times a day (TID) | ORAL | Status: DC

## 2016-03-12 NOTE — Telephone Encounter (Signed)
Received fax request for Tramadol 50mg .  Dr. Al CorpusHyatt states refill as previously.  Refill form completed and faxed.

## 2016-03-13 ENCOUNTER — Telehealth: Payer: Self-pay | Admitting: Family Medicine

## 2016-03-13 ENCOUNTER — Telehealth: Payer: Self-pay | Admitting: Surgical

## 2016-03-13 NOTE — Telephone Encounter (Signed)
I called and informed patient that he has been cleared for surgery on tomorrow.  "That's what I been calling to try and find out.  Thank you."

## 2016-03-13 NOTE — Telephone Encounter (Signed)
Spoke with Delydia from [facility]. They are needing a note sent to Dr Al CorpusHyatt stating that patient is cleared for surgery. They need to know if any medication needs to be stopped? He is going to be under local IV anesthesia and procedure will take 2.5 hours. Please send the message to Dr. Al CorpusHyatt high priority.

## 2016-03-13 NOTE — Telephone Encounter (Signed)
I was supposed to call Dr. Geryl RankinsHyatt's office to  send a fax [phone] for [facility] clearance.    I'm returning your call.  Did you speak to Dr. Birdie SonsSonnenberg?  "I called but I don't know if they understood what I was wanting.  Can you call them?"  Yes sir, I will call him.  "I thought someone from Dr. Geryl RankinsHyatt's office called this morning and said everything was cleared."  No one from Dr. Geryl RankinsHyatt's office called.  It may have been someone from the surgical center that called.  They were probably calling to give you the arrival time.    [facility] called and [facility] at Dr. Purvis SheffieldSonnenberg's office.  I informed her that Dr. Al CorpusHyatt wanted medical clearance to perform an outpatient procedure on patient.  It will be under a local / IV sedation.  It's a 2.5 hours procedure.  There's not a form that needs to be filled out.  We just need a letter stating that he is cleared.  "I'll let Dr. Birdie SonsSonnenberg know and I'll let him know surgery is scheduled for tomorrow.  I'll get him to send Dr. Al CorpusHyatt a message via EPIC and I'll call and let you know."  That will be fine.

## 2016-03-13 NOTE — Telephone Encounter (Signed)
Pt can be contacted on [phone]. Regarding surgery clearance. Thank you!

## 2016-03-13 NOTE — Telephone Encounter (Signed)
Message has been sent to Dr. Al CorpusHyatt.

## 2016-03-13 NOTE — Telephone Encounter (Signed)
I'm calling you for Dr. Al CorpusHyatt.  He wants to know if you got clearance for your surgery scheduled for tomorrow.  "He called me this morning and said I was cleared."  No, Dr. Al CorpusHyatt did not call you stating that you were cleared.  He said he will have to cancel your surgery if he doesn't receive a written statement that you are cleared.  "Let me call Dr. De NurseSonneberg and I'll call you back."

## 2016-03-14 ENCOUNTER — Encounter: Payer: Self-pay | Admitting: Podiatry

## 2016-03-14 DIAGNOSIS — M7660 Achilles tendinitis, unspecified leg: Secondary | ICD-10-CM | POA: Diagnosis not present

## 2016-03-14 DIAGNOSIS — M19079 Primary osteoarthritis, unspecified ankle and foot: Secondary | ICD-10-CM | POA: Diagnosis not present

## 2016-03-19 ENCOUNTER — Encounter: Payer: Self-pay | Admitting: Podiatry

## 2016-03-19 ENCOUNTER — Ambulatory Visit (INDEPENDENT_AMBULATORY_CARE_PROVIDER_SITE_OTHER): Payer: 59 | Admitting: Podiatry

## 2016-03-19 ENCOUNTER — Ambulatory Visit (INDEPENDENT_AMBULATORY_CARE_PROVIDER_SITE_OTHER): Payer: 59

## 2016-03-19 VITALS — BP 154/86 | HR 78 | Resp 18

## 2016-03-19 DIAGNOSIS — Z9889 Other specified postprocedural states: Secondary | ICD-10-CM | POA: Diagnosis not present

## 2016-03-19 DIAGNOSIS — M19071 Primary osteoarthritis, right ankle and foot: Secondary | ICD-10-CM | POA: Diagnosis not present

## 2016-03-19 NOTE — Progress Notes (Signed)
He presents today 6 days status post subtalar joint fusion right foot. He denies fever chills nausea vomiting muscle aches and pains. He denies shortness of breath. He is able to ambulate with a knee scooter and crutches and walker.  Objective: He presents today vital signs stable alert and oriented 3 cast to the right lower extremity the bottom appears to be essentially clean and no signs of breakdown. His toes are flexible warm to the touch abnormal color with immediate capillary fill time. Radiographs taken today demonstrate good approximation of the subtalar joint with 2 large 7.5 screws from the posterior inferior calcaneus into the talus.  Assessment: Subtalar joint fusion status post 5-6 days.  Plan: Continue nonweightbearing status follow up with him in 1 week for a cast removal and a cast. He is to notify us with any questions or concerns.

## 2016-03-20 ENCOUNTER — Telehealth: Payer: Self-pay | Admitting: *Deleted

## 2016-03-20 NOTE — Telephone Encounter (Signed)
-----   Message from Elinor ParkinsonMax T Hyatt, [place] sent at 03/19/2016  4:01 PM EDT ----- Bonita QuinYou may call the patient and inquire as to whether or not he needs home health aid.  Document that he declines and states that he can take care of himself if this is the case.   Ask specifically if the brother or any family members have power of attorney over him.  If not, and they are not on the HIPPA form you cannot give them any information. ----- Message -----    From: Marissa NestleValery D O'Connell, RN    Sent: 03/19/2016   3:14 PM      To: Kristian CoveyAshley E Prevette, PMAC, Max Maud Deed Hyatt, DPM  Dr. Al CorpusHyatt, and Morrie SheldonAshley, this is what Dr. Ardelle AntonWagoner sent me.  Please advise and if you remember the lady from there that was morbidly obese and broke something in both extremities that may help me find a place for him if necessary.  Joya SanValery  ----- Message -----    From: Vivi BarrackMatthew R Wagoner, DPM    Sent: 03/18/2016   8:18 AM      To: Marissa NestleValery D O'Connell, RN  Val-  This patients brother Gala Romney(Doug) called me over the weekend with multiple concerns after his brother had surgery with Dr. Al CorpusHyatt on Friday. He states he cannot care for himself, lives alone, and that the brother cannot care for him long term. He would like for him to be in a rehab for safety. I discussed that vs. Having a nurse come to the house (maybe that company that came in to do lunch last week??). Anyway, could you call them and see what is going on and they wanted to get in to see Dr. Al CorpusHyatt to discuss possible rehab. The family wants rehab but apparently the patient is against it. Sorry. There was not much I could do on Saturday for this.

## 2016-03-20 NOTE — Telephone Encounter (Signed)
Dr. Al CorpusHyatt request a post op check call to see if pt needs in home assistance.  I spoke with pt, he states he is doing fine, and does not require assistance in the home.  I told pt that if at anytime he changed his mind to call our office and I would order for him with Dr. Geryl RankinsHyatt's instructions.  I told pt to call with concerns.  Pt states understanding.

## 2016-03-26 ENCOUNTER — Encounter: Payer: Self-pay | Admitting: Podiatry

## 2016-03-26 ENCOUNTER — Ambulatory Visit (INDEPENDENT_AMBULATORY_CARE_PROVIDER_SITE_OTHER): Payer: 59 | Admitting: Podiatry

## 2016-03-26 DIAGNOSIS — Z9889 Other specified postprocedural states: Secondary | ICD-10-CM

## 2016-03-26 DIAGNOSIS — M19071 Primary osteoarthritis, right ankle and foot: Secondary | ICD-10-CM | POA: Diagnosis not present

## 2016-03-26 NOTE — Progress Notes (Signed)
He presents today 2 weeks status post subtalar joint fusion right. He denies fever chills nausea vomiting muscle aches and pains states that he's been doing pretty well.  Objective: Vital signs are stable he is alert and oriented 3. Pulses are palpable. Cast is intact once removed demonstrates dry sterile dressing intact was removed demonstrates minimal edema no erythema saline strange odor sutures are intact to the posterior inferior heel which are removed today margins remaining well coapted staples are well coapted and are intact though there is some maceration of the skin similarly the staples intact.  Assessment: Well healing subtalar joint fusion. Right.  Plan: Redressed today dry sterile compressive dressing was sutures were removed and placed another below-knee cast he will follow up with me in 2 weeks for staple removal cast removal and new application of cast.

## 2016-03-31 DIAGNOSIS — Z9889 Other specified postprocedural states: Secondary | ICD-10-CM

## 2016-04-09 ENCOUNTER — Encounter: Payer: Self-pay | Admitting: Podiatry

## 2016-04-09 ENCOUNTER — Ambulatory Visit: Payer: Self-pay

## 2016-04-09 ENCOUNTER — Ambulatory Visit (INDEPENDENT_AMBULATORY_CARE_PROVIDER_SITE_OTHER): Payer: 59 | Admitting: Podiatry

## 2016-04-09 VITALS — BP 132/77 | HR 71 | Temp 98.8°F | Resp 18

## 2016-04-09 DIAGNOSIS — Z9889 Other specified postprocedural states: Secondary | ICD-10-CM

## 2016-04-09 DIAGNOSIS — M19071 Primary osteoarthritis, right ankle and foot: Secondary | ICD-10-CM

## 2016-04-09 NOTE — Addendum Note (Signed)
Addended by: Kristian CoveyPREVETTE, Danelia Snodgrass E on: 04/09/2016 04:34 PM   Modules accepted: Orders

## 2016-04-09 NOTE — Progress Notes (Signed)
He presents today for follow-up of his subtalar joint arthrodesis. Date of surgery 03/13/2016. He states that he seems to be doing well denies fever chills nausea vomiting muscle aches and pains shortness of breath or chest pain. He continues to take his aspirin. He states that he has to walk on the little little bit but it really doesn't hurt.  Objective: Vital signs are stable he is alert and oriented 3 presents with a cast intact today there is some dirt to the plantar aspect of the cast but not a lot. Once the cast was removed margins remain well coapted at the surgical sites no signs of infection. Minimal edema looks much better than it did prior to surgery.  Assessment: Well-healing subtalar joint fusion right.  Plan: I placed him in another cast today with typical fashion. At this point I will follow-up with him in 2 weeks for another set of x-rays and a possible cast change.

## 2016-04-23 ENCOUNTER — Ambulatory Visit (INDEPENDENT_AMBULATORY_CARE_PROVIDER_SITE_OTHER): Payer: 59

## 2016-04-23 ENCOUNTER — Ambulatory Visit (INDEPENDENT_AMBULATORY_CARE_PROVIDER_SITE_OTHER): Payer: 59 | Admitting: Podiatry

## 2016-04-23 ENCOUNTER — Encounter: Payer: Self-pay | Admitting: Podiatry

## 2016-04-23 DIAGNOSIS — Z9889 Other specified postprocedural states: Secondary | ICD-10-CM

## 2016-04-23 DIAGNOSIS — M19071 Primary osteoarthritis, right ankle and foot: Secondary | ICD-10-CM | POA: Diagnosis not present

## 2016-04-23 NOTE — Progress Notes (Signed)
He presents today for postop visit subtalar joint fusion right foot dated surgery 03/14/2016. He states that I haven't even taken any pain medicine lately.  Objective: Vital signs are stable he is alert and oriented 3 he is been partial weightbearing with the cast on. He states that he seems to be doing okay. Cast was removed demonstrate pulses are strongly palpable no lesions or irritations from the cast. Wound sizes on to heal uneventfully.  Assessment: Well-healing surgical foot right.  Plan: I'm requesting that he utilize another cast. We applied another below-knee cast to the right side today and I will follow-up with him in 2 weeks for removal and application of a below knee walking boot.

## 2016-05-07 ENCOUNTER — Encounter: Payer: Self-pay | Admitting: Podiatry

## 2016-05-07 ENCOUNTER — Ambulatory Visit (INDEPENDENT_AMBULATORY_CARE_PROVIDER_SITE_OTHER): Payer: 59

## 2016-05-07 ENCOUNTER — Ambulatory Visit (INDEPENDENT_AMBULATORY_CARE_PROVIDER_SITE_OTHER): Payer: 59 | Admitting: Podiatry

## 2016-05-07 DIAGNOSIS — M19071 Primary osteoarthritis, right ankle and foot: Secondary | ICD-10-CM

## 2016-05-07 DIAGNOSIS — Z9889 Other specified postprocedural states: Secondary | ICD-10-CM

## 2016-05-07 NOTE — Progress Notes (Signed)
He presents today nearly 7 weeks status post subtalar joint arthrodesis right foot. He presents ambulating partial weightbearing in a cast to the right lower extremity. He denies calf pain shortness of breath or chest pain.  Objective: Cast was intact. Once removed demonstrates vital signs are stable he is alert and oriented 3. Pulses are intact right lower extremity. Dry xerotic skin but no pain on palpation of the surgical site or on attempted range of motion of the subtalar joint. Radiographs taken today demonstrates 2 large screws through the subtalar joint which appears to be healing normally as an arthrodesis site. No other osseous abnormalities are visualized on the radiographs.  Assessment: Well-healing arthrodesis subtalar joint right foot.  Plan: I placed him in a Cam Walker today and I will allow partial weightbearing utilizing his walker. Should he continue to progress normally and we will remove the boot next week and place him in a tennis shoe. He will continue the use of the walker at this point. I would like to try to get him back to work as soon as possible.

## 2016-05-22 ENCOUNTER — Ambulatory Visit: Payer: Managed Care, Other (non HMO) | Admitting: Family Medicine

## 2016-05-22 DIAGNOSIS — Z0289 Encounter for other administrative examinations: Secondary | ICD-10-CM

## 2016-05-26 ENCOUNTER — Ambulatory Visit: Payer: 59

## 2016-05-26 ENCOUNTER — Encounter: Payer: Self-pay | Admitting: Podiatry

## 2016-05-26 ENCOUNTER — Ambulatory Visit (INDEPENDENT_AMBULATORY_CARE_PROVIDER_SITE_OTHER): Payer: 59 | Admitting: Podiatry

## 2016-05-26 DIAGNOSIS — M19071 Primary osteoarthritis, right ankle and foot: Secondary | ICD-10-CM | POA: Diagnosis not present

## 2016-05-26 DIAGNOSIS — Z9889 Other specified postprocedural states: Secondary | ICD-10-CM

## 2016-05-26 NOTE — Progress Notes (Signed)
He presents today for follow-up of his subtalar joint fusion right foot. Surgery 05/14/2016. He states that he is doing very well he states that he walked around his house and [place] yesterday most of the day without his cam walker on.  Objective: Vital signs are stable he is alert and oriented 3. Pulses are palpable. Right ankle slightly sore subtalar joint is rigid. There is no pain on attempted motion subtalar joint. Radius taken today demonstrate 2 screws across the osteotomy site posterior facet. There is really healing normally.  Assessment: Well-healing surgical arthrodesis subtalar joint right.  Plan: I encouraged him to continue to wear his Cam Walker for the next 2 weeks no set of x-rays revealed performed at that time to see if we can get back to work.

## 2016-06-09 ENCOUNTER — Ambulatory Visit (INDEPENDENT_AMBULATORY_CARE_PROVIDER_SITE_OTHER): Payer: 59

## 2016-06-09 ENCOUNTER — Encounter: Payer: Self-pay | Admitting: Podiatry

## 2016-06-09 ENCOUNTER — Ambulatory Visit: Payer: 59 | Admitting: Podiatry

## 2016-06-09 ENCOUNTER — Ambulatory Visit (INDEPENDENT_AMBULATORY_CARE_PROVIDER_SITE_OTHER): Payer: 59 | Admitting: Podiatry

## 2016-06-09 DIAGNOSIS — Z9889 Other specified postprocedural states: Secondary | ICD-10-CM

## 2016-06-09 DIAGNOSIS — M7661 Achilles tendinitis, right leg: Secondary | ICD-10-CM | POA: Diagnosis not present

## 2016-06-09 DIAGNOSIS — M19071 Primary osteoarthritis, right ankle and foot: Secondary | ICD-10-CM

## 2016-06-09 NOTE — Progress Notes (Signed)
He presents today status post subtalar joint fusion right foot date of surgery 03/14/2016. He states that he is ready to go back to work in the foot feels much better than it did fine after surgery. He denies fever chills nausea and vomiting muscle aches and pains. He also relates that he walks without his boot at home at all times. He states that he only wears the boot when he comes out.  Objective: Vital signs are stable alert and oriented 3. Pulses are palpable. He has no pain on palpation of the incision site or of the subtalar joint. His mild tenderness on attempted inversion and eversion of the subtalar joint but much more minimal so than prior to surgery. Radiographs taken today of the right foot demonstrate 2 large 75 screws to the calcaneus and talus for fusion of the subtalar joint which appears to be nearly completely. No open lesions or wounds on physical exam. Much decrease in edema to the right foot.  Assessment: Well-healing surgical subtalar joint fusion right.  Plan: I would allow him to go back to work and I will follow-up with him in 6 weeks. I recommended that he continue to use his Cam Dan HumphreysWalker anytime that he is at home. I also recommended good shoes and/or boots for his right foot when he goes back to work.

## 2016-06-19 NOTE — Progress Notes (Signed)
DOS 03/14/2016 Subtalar joint fusion right foot, cast application

## 2016-07-23 ENCOUNTER — Ambulatory Visit (INDEPENDENT_AMBULATORY_CARE_PROVIDER_SITE_OTHER): Payer: 59 | Admitting: Podiatry

## 2016-07-23 ENCOUNTER — Ambulatory Visit (INDEPENDENT_AMBULATORY_CARE_PROVIDER_SITE_OTHER): Payer: 59

## 2016-07-23 DIAGNOSIS — Z9889 Other specified postprocedural states: Secondary | ICD-10-CM

## 2016-07-23 DIAGNOSIS — M19071 Primary osteoarthritis, right ankle and foot: Secondary | ICD-10-CM

## 2016-07-23 DIAGNOSIS — M7661 Achilles tendinitis, right leg: Secondary | ICD-10-CM

## 2016-07-23 NOTE — Progress Notes (Signed)
He presents today for his last postop visit regarding his subtalar joint fusion of his right foot. He states that he is loving his foot. He states he has no pain whatsoever in his foot however his hips and knees are still painful. He has been back to work for the past 6 weeks with no pain.  Objective: Vital signs are stable he is alert and oriented 3 pulses are palpable. Neurologic sensorium is intact incision site has gone on to heal normally with a fine thin scar. No pain on palpation or pain on range of motion. Radiographic evaluation demonstrates too large 7.5 mm screws through the subtalar joint are intact with fusion of the posterior facet of the subtalar joint. This appears to be healing normally.  Assessment: Well-healing surgical foot. He will follow-up with me on an as-needed basis. I encouraged him to have his hips evaluated by orthopedics.

## 2018-07-23 ENCOUNTER — Emergency Department
Admission: EM | Admit: 2018-07-23 | Discharge: 2018-07-23 | Disposition: A | Discharge status: Home / Self Care | Payer: 59 | Attending: Emergency Medicine | Admitting: Emergency Medicine

## 2018-07-23 ENCOUNTER — Encounter: Payer: Self-pay | Admitting: Emergency Medicine

## 2018-07-23 ENCOUNTER — Other Ambulatory Visit: Payer: Self-pay

## 2018-07-23 DIAGNOSIS — J45909 Unspecified asthma, uncomplicated: Secondary | ICD-10-CM | POA: Insufficient documentation

## 2018-07-23 DIAGNOSIS — I10 Essential (primary) hypertension: Secondary | ICD-10-CM

## 2018-07-23 DIAGNOSIS — T148XXA Other injury of unspecified body region, initial encounter: Secondary | ICD-10-CM

## 2018-07-23 DIAGNOSIS — Z87891 Personal history of nicotine dependence: Secondary | ICD-10-CM | POA: Diagnosis not present

## 2018-07-23 DIAGNOSIS — L97829 Non-pressure chronic ulcer of other part of left lower leg with unspecified severity: Secondary | ICD-10-CM | POA: Diagnosis not present

## 2018-07-23 DIAGNOSIS — T8130XA Disruption of wound, unspecified, initial encounter: Secondary | ICD-10-CM | POA: Diagnosis not present

## 2018-07-23 LAB — CBC WITH DIFFERENTIAL/PLATELET
Basophils Absolute: 0.3 10*3/uL — ABNORMAL HIGH (ref 0–0.1)
Basophils Relative: 4 %
EOS ABS: 0.2 10*3/uL (ref 0–0.7)
Eosinophils Relative: 3 %
HCT: 34.6 % — ABNORMAL LOW (ref 40.0–52.0)
HEMOGLOBIN: 11.1 g/dL — AB (ref 13.0–18.0)
LYMPHS ABS: 1.8 10*3/uL (ref 1.0–3.6)
Lymphocytes Relative: 24 %
MCH: 28.6 pg (ref 26.0–34.0)
MCHC: 32.2 g/dL (ref 32.0–36.0)
MCV: 88.8 fL (ref 80.0–100.0)
MONOS PCT: 9 %
Monocytes Absolute: 0.7 10*3/uL (ref 0.2–1.0)
Neutro Abs: 4.5 10*3/uL (ref 1.4–6.5)
Neutrophils Relative %: 60 %
Platelets: 294 10*3/uL (ref 150–440)
RBC: 3.89 MIL/uL — ABNORMAL LOW (ref 4.40–5.90)
RDW: 13.5 % (ref 11.5–14.5)
WBC: 7.4 10*3/uL (ref 3.8–10.6)

## 2018-07-23 LAB — COMPREHENSIVE METABOLIC PANEL
ALK PHOS: 68 U/L (ref 38–126)
ALT: 20 U/L (ref 0–44)
AST: 22 U/L (ref 15–41)
Albumin: 4.2 g/dL (ref 3.5–5.0)
Anion gap: 9 (ref 5–15)
BUN: 24 mg/dL — ABNORMAL HIGH (ref 6–20)
CHLORIDE: 102 mmol/L (ref 98–111)
CO2: 26 mmol/L (ref 22–32)
Calcium: 9 mg/dL (ref 8.9–10.3)
Creatinine, Ser: 1.14 mg/dL (ref 0.61–1.24)
GFR calc Af Amer: >60 mL/min (ref >60)
Glucose, Bld: 95 mg/dL (ref 70–99)
Potassium: 3.9 mmol/L (ref 3.5–5.1)
Sodium: 137 mmol/L (ref 135–145)
Total Bilirubin: 0.6 mg/dL (ref 0.3–1.2)
Total Protein: 8 g/dL (ref 6.5–8.1)

## 2018-07-23 LAB — LACTIC ACID, PLASMA: LACTIC ACID, VENOUS: 0.6 mmol/L (ref 0.5–1.9)

## 2018-07-23 LAB — GLUCOSE, CAPILLARY: Glucose-Capillary: 77 mg/dL (ref 70–99)

## 2018-07-23 MED ORDER — SULFAMETHOXAZOLE-TRIMETHOPRIM 800-160 MG PO TABS: 1.0000 | ORAL_TABLET | Freq: Two times a day (BID) | ORAL | 0 refills | Status: DC

## 2018-07-23 NOTE — ED Triage Notes (Signed)
Pt arrived via wheelchair with reports of unhealed wound to left lower leg.  Pt has + purulent drainage and swelling present. States started from a possible bug bite several years ago.  Pt here with family. Drainage stained on jeans. Unknown if diabetic or not.  Pt states he stands on his feet for multiple hours at a time so he normally has swelling present.

## 2018-07-23 NOTE — ED Notes (Signed)
Pt discharged home after verbalizing understanding of discharge instructions; nad noted. 

## 2018-07-23 NOTE — ED Notes (Signed)
First Nurse Note: Pt brought over from [facility] for possible infected bug bite on left leg.

## 2018-07-23 NOTE — ED Provider Notes (Signed)
[facility] Emergency Department Provider Note   ____________________________________________    I have reviewed the triage vital signs and the nursing notes.   HISTORY  Chief Complaint Cellulitis     HPI Johnathan Scott. is a 52 y.o. male who presents with complaints of left leg wound that is possibly infected.  Patient reports that he is on his feet all day which causes his legs to swell, family became concerned because there was drainage from his wound which has been there for several months.  He states this wound is originally related to an insect bite.  No fevers or chills.    Past Medical History:  Diagnosis Date  . Asthma   . Chickenpox     Patient Active Problem List   Diagnosis Date Noted  . Anemia 02/20/2016  . Bilateral lower extremity edema 01/20/2016  . Palpitations 01/20/2016  . Panic attacks 01/20/2016    No past surgical history on file.  Prior to Admission medications   Medication Sig Start Date End Date Taking? Authorizing Provider  ferrous sulfate 325 (65 FE) MG tablet Take 1 tablet (325 mg total) by mouth 3 (three) times daily with meals. 01/29/16   Glori Luis, MD  HYDROmorphone (DILAUDID) 4 MG tablet Take 1 tablet (4 mg total) by mouth every 6 (six) hours as needed for severe pain. 03/12/16   Hyatt, Max T, DPM  ibuprofen (ADVIL,MOTRIN) 200 MG tablet Take 400 mg by mouth every 6 (six) hours as needed for mild pain.    [provider]  ondansetron (ZOFRAN) 4 MG tablet Take 1 tablet (4 mg total) by mouth every 8 (eight) hours as needed for nausea or vomiting. 03/12/16   Hyatt, Max T, DPM  sulfamethoxazole-trimethoprim (BACTRIM DS,SEPTRA DS) 800-160 MG tablet Take 1 tablet by mouth 2 (two) times daily. 07/23/18   Jene Every, MD  traMADol (ULTRAM) 50 MG tablet Take 1 tablet (50 mg total) by mouth every 6 (six) hours as needed. Take one or two tablets every 6-8 hour prn 03/12/16   Hyatt, Max T, DPM      Allergies Novocain [procaine] and Cortisone  Family History  Problem Relation Age of Onset  . Breast cancer Mother     Social History Social History   Tobacco Use  . Smoking status: Former Smoker    Last attempt to quit: 02/08/2007    Years since quitting: 11.4  Substance Use Topics  . Alcohol use: No    Alcohol/week: 0.0 standard drinks  . Drug use: No    Review of Systems  Constitutional: No fever Eyes: No visual changes.  ENT: No throat swelling Cardiovascular: Denies chest pain. Respiratory: Denies shortness of breath. Gastrointestinal: No abdominal pain.     Genitourinary: Negative for dysuria. Musculoskeletal: Negative for back pain. Skin: Wound to the left shin, chronic Neurological: Negative for headaches    ____________________________________________   PHYSICAL EXAM:  VITAL SIGNS: ED Triage Vitals [07/23/18 1752]  Enc Vitals Group     BP (!) 182/82     Pulse Rate 66     Resp 18     Temp 98 F (36.7 C)     Temp Source Oral     SpO2 100 %     Weight 97.5 kg (215 lb)     Height 1.562 m (5' 1.5")     Head Circumference      Peak Flow      Pain Score 0     Pain  Loc      Pain Edu?      Excl. in GC?     Constitutional: Alert and oriented. Eyes: Conjunctivae are normal.   Nose: No congestion/rhinnorhea. Mouth/Throat: Mucous membranes are moist.    Cardiovascular: Normal rate, regular rhythm. Grossly normal heart sounds.  Good peripheral circulation. Respiratory: Normal respiratory effort.  No retractions. Lungs CTAB. Gastrointestinal: Soft and nontender. No distention.  No CVA tenderness.  Musculoskeletal: Left lower shin: Approximately 3 x 3 cm ulceration with granulation tissue, mild surrounding erythema unlikely to represent cellulitis.  Some clearish discharge.  Warm and well perfused distally Neurologic:  Normal speech and language. No gross focal neurologic deficits are appreciated.  Skin:  Skin is warm, dry, as above Psychiatric:  Mood and affect are normal. Speech and behavior are normal.  ____________________________________________   LABS (all labs ordered are listed, but only abnormal results are displayed)  Labs Reviewed  COMPREHENSIVE METABOLIC PANEL - Abnormal; Notable for the following components:      Result Value   BUN 24 (*)    All other components within normal limits  CBC WITH DIFFERENTIAL/PLATELET - Abnormal; Notable for the following components:   RBC 3.89 (*)    Hemoglobin 11.1 (*)    HCT 34.6 (*)    Basophils Absolute 0.3 (*)    All other components within normal limits  LACTIC ACID, PLASMA  GLUCOSE, CAPILLARY  LACTIC ACID, PLASMA  CBG MONITORING, ED   ____________________________________________  EKG  None ____________________________________________  RADIOLOGY   ____________________________________________   PROCEDURES  Procedure(s) performed: No  Procedures   Critical Care performed: No ____________________________________________   INITIAL IMPRESSION / ASSESSMENT AND PLAN / ED COURSE  Pertinent labs & imaging results that were available during my care of the patient were reviewed by me and considered in my medical decision making (see chart for details).  Patient presents for evaluation of lower leg wound, lab work is quite reassuring, no white blood cell count, normal lactic, afebrile, no streaking erythema.  We will start the patient on Bactrim, outpatient follow-up with wound clinic.    ____________________________________________   FINAL CLINICAL IMPRESSION(S) / ED DIAGNOSES  Final diagnoses:  Essential hypertension  Wound discharge        Note:  This document was prepared using Dragon voice recognition software and may include unintentional dictation errors.    Jene Every, MD 07/23/18 [phone]

## 2018-07-23 NOTE — ED Notes (Signed)
See triage note; pt presents with wound to left lower extremity with purulent drainage. Pt states it has been getting worse after bug bite some time ago. Pt alert & oriented with NAD noted.

## 2018-07-23 NOTE — ED Notes (Signed)
ED Provider at bedside. 

## 2018-07-23 NOTE — ED Notes (Signed)
Signature obtained on paper copy due to computer malfunction

## 2018-08-09 ENCOUNTER — Ambulatory Visit (INDEPENDENT_AMBULATORY_CARE_PROVIDER_SITE_OTHER): Payer: 59 | Admitting: Family Medicine

## 2018-08-09 ENCOUNTER — Encounter: Payer: Self-pay | Admitting: Family Medicine

## 2018-08-09 ENCOUNTER — Encounter: Payer: 59 | Attending: Physician Assistant | Admitting: Physician Assistant

## 2018-08-09 DIAGNOSIS — I10 Essential (primary) hypertension: Secondary | ICD-10-CM

## 2018-08-09 DIAGNOSIS — I872 Venous insufficiency (chronic) (peripheral): Secondary | ICD-10-CM | POA: Insufficient documentation

## 2018-08-09 DIAGNOSIS — J45909 Unspecified asthma, uncomplicated: Secondary | ICD-10-CM | POA: Diagnosis not present

## 2018-08-09 DIAGNOSIS — G40909 Epilepsy, unspecified, not intractable, without status epilepticus: Secondary | ICD-10-CM | POA: Insufficient documentation

## 2018-08-09 DIAGNOSIS — Z8249 Family history of ischemic heart disease and other diseases of the circulatory system: Secondary | ICD-10-CM | POA: Diagnosis not present

## 2018-08-09 DIAGNOSIS — M1990 Unspecified osteoarthritis, unspecified site: Secondary | ICD-10-CM | POA: Insufficient documentation

## 2018-08-09 DIAGNOSIS — D509 Iron deficiency anemia, unspecified: Secondary | ICD-10-CM | POA: Diagnosis not present

## 2018-08-09 DIAGNOSIS — L97822 Non-pressure chronic ulcer of other part of left lower leg with fat layer exposed: Secondary | ICD-10-CM | POA: Insufficient documentation

## 2018-08-09 DIAGNOSIS — D649 Anemia, unspecified: Secondary | ICD-10-CM

## 2018-08-09 DIAGNOSIS — S81802A Unspecified open wound, left lower leg, initial encounter: Secondary | ICD-10-CM | POA: Insufficient documentation

## 2018-08-09 DIAGNOSIS — Z809 Family history of malignant neoplasm, unspecified: Secondary | ICD-10-CM | POA: Diagnosis not present

## 2018-08-09 MED ORDER — HYDROCHLOROTHIAZIDE 12.5 MG PO TABS: 12.5000 mg | ORAL_TABLET | Freq: Every day | ORAL | 1 refills | Status: DC

## 2018-08-09 NOTE — Patient Instructions (Signed)
Nice to see you. We will start you on hydrochlorothiazide for your blood pressure.  If you develop lightheadedness or any new symptoms while taking this please contact us.  You will return in 1 month for blood pressure check and lab work. Please see wound care as scheduled. If you change your mind regarding colon cancer screening please let us know.

## 2018-08-09 NOTE — Assessment & Plan Note (Signed)
Blood pressure has been elevated recently.  Will trial hydrochlorothiazide.  He will return in 1 month for BP check and labs.  Discussed if he develops any side effects letting us know.

## 2018-08-09 NOTE — Progress Notes (Signed)
  Marikay Alar, MD Phone: [phone]  Johnathan Scott. is a 52 y.o. male who presents today for f/u.  CC: htn, leg wound, anemia  HYPERTENSION  Disease Monitoring  Home BP Monitoring not checking though has been up to 190 systolically multiple times recently Chest pain- no    Dyspnea- no Medications  Compliance-  No medications. Lightheadedness-  Once with elevated BP  Edema- chronic, unchanged, no orthopnea  Left leg wound: This has been present since May.  Started with a bug bite.  He was recently evaluated in the emergency department and placed on Bactrim which he notes did help it improved some.  No fevers.  He knocked part of the eschar off last week on accident.  Anemia: Patient has had chronic anemia.  He reports he has had it since he was young.  States they advised him previously that he had iron poor blood.  Prior iron studies appear to be consistent with anemia of chronic disease.  He declines seeing a hematologist.  He declined seeing GI for colonoscopy and endoscopy.  He declines colon cancer screening with Cologuard.    Social History   Tobacco Use  Smoking Status Former Smoker  . Last attempt to quit: 02/08/2007  . Years since quitting: 11.5  Smokeless Tobacco Never Used     ROS see history of present illness  Objective  Physical Exam Vitals:   08/09/18 1139  BP: (!) 142/84  Pulse: 68  Temp: 97.8 F (36.6 C)  SpO2: 98%    BP Readings from Last 3 Encounters:  08/09/18 (!) 142/84  07/23/18 (!) 184/84  04/09/16 132/77   Wt Readings from Last 3 Encounters:  08/09/18 213 lb 3.2 oz (96.7 kg)  07/23/18 215 lb (97.5 kg)  02/20/16 219 lb 12.8 oz (99.7 kg)    Physical Exam  Constitutional: No distress.  Cardiovascular: Normal rate, regular rhythm and normal heart sounds.  Pulmonary/Chest: Effort normal and breath sounds normal.  Neurological: He is alert.  Skin: Skin is warm and dry. He is not diaphoretic.    Left leg wound, no warmth, no  tenderness   Assessment/Plan: Please see individual problem list.  HTN (hypertension) Blood pressure has been elevated recently.  Will trial hydrochlorothiazide.  He will return in 1 month for BP check and labs.  Discussed if he develops any side effects letting us know.  Anemia Appears to been anemia of chronic disease based on review of prior iron studies.  I discussed referral to hematology for further evaluation though he declined.  I also discussed referral to GI though he declined that as well.  We will periodically monitor this.  It does appear to have been stable for some time.  Leg wound, left, initial encounter See picture above.  Patient has an appointment with wound care later today.  He will keep that appointment.   Health Maintenance: Patient declined colon cancer screening.  Orders Placed This Encounter  Procedures  . Basic Metabolic Panel (BMET)    Standing Status:   Future    Standing Expiration Date:   08/10/2019    Meds ordered this encounter  Medications  . hydrochlorothiazide (HYDRODIURIL) 12.5 MG tablet    Sig: Take 1 tablet (12.5 mg total) by mouth daily.    Dispense:  30 tablet    Refill:  1     Marikay Alar, MD [facility]

## 2018-08-09 NOTE — Assessment & Plan Note (Signed)
Appears to been anemia of chronic disease based on review of prior iron studies.  I discussed referral to hematology for further evaluation though he declined.  I also discussed referral to GI though he declined that as well.  We will periodically monitor this.  It does appear to have been stable for some time.

## 2018-08-09 NOTE — Assessment & Plan Note (Signed)
See picture above.  Patient has an appointment with wound care later today.  He will keep that appointment.

## 2018-08-10 NOTE — Progress Notes (Signed)
ALEXIZ, SUSTAITA ([id number]) Visit Report for 08/09/2018 Abuse/Suicide Risk Screen Details Patient Name: Johnathan Scott, Johnathan Scott Date of Service: 08/09/2018 1:00 PM Medical Record Number: [id number] Patient Account Number: [id number] Date of Birth/Sex: 08-16-1966 (52 y.o. M) Treating RN: Curtis Sites Primary Care Ferlando Lia: Birdie Sons, ERIC Other Clinician: Referring Levelle Edelen: Jene Every Treating Hendy Brindle/Extender: STONE III, HOYT Weeks in Treatment: 0 Abuse/Suicide Risk Screen Items Answer ABUSE/SUICIDE RISK SCREEN: Has anyone close to you tried to hurt or harm you recentlyo No Do you feel uncomfortable with anyone in your familyo No Has anyone forced you do things that you didnot want to doo No Do you have any thoughts of harming yourselfo No Patient displays signs or symptoms of abuse and/or neglect. No Electronic Signature(s) Signed: 08/09/2018 5:36:32 PM By: Curtis Sites Entered By: Curtis Sites on 08/09/2018 13:06:00 Johnathan Scott ([id number]) -------------------------------------------------------------------------------- Activities of Daily Living Details Patient Name: Johnathan Scott Date of Service: 08/09/2018 1:00 PM Medical Record Number: [id number] Patient Account Number: [id number] Date of Birth/Sex: 04/27/1966 (52 y.o. M) Treating RN: Curtis Sites Primary Care Kallyn Demarcus: Birdie Sons, ERIC Other Clinician: Referring Katlyn Muldrew: Jene Every Treating Asriel Westrup/Extender: STONE III, HOYT Weeks in Treatment: 0 Activities of Daily Living Items Answer Activities of Daily Living (Please select one for each item) Drive Automobile Completely Able Take Medications Completely Able Use Telephone Completely Able Care for Appearance Completely Able Use Toilet Completely Able Bath / Shower Completely Able Dress Self Completely Able Feed Self Completely Able Walk Completely Able Get In / Out Bed Completely Able Housework Completely Able Prepare Meals Completely  Able Handle Money Completely Able Shop for Self Completely Able Electronic Signature(s) Signed: 08/09/2018 5:36:32 PM By: Curtis Sites Entered By: Curtis Sites on 08/09/2018 13:06:20 Johnathan Scott ([id number]) -------------------------------------------------------------------------------- Education Assessment Details Patient Name: Johnathan Scott Date of Service: 08/09/2018 1:00 PM Medical Record Number: [id number] Patient Account Number: [id number] Date of Birth/Sex: 07/28/1966 (52 y.o. M) Treating RN: Curtis Sites Primary Care Chamar Broughton: Birdie Sons, ERIC Other Clinician: Referring Charday Capetillo: Jene Every Treating Codee Tutson/Extender: Linwood Dibbles, HOYT Weeks in Treatment: 0 Primary Learner Assessed: Patient Learning Preferences/Education Level/Primary Language Learning Preference: Explanation, Demonstration Highest Education Level: High School Preferred Language: English Cognitive Barrier Assessment/Beliefs Language Barrier: No Translator Needed: No Memory Deficit: No Emotional Barrier: No Cultural/Religious Beliefs Affecting Medical Care: No Physical Barrier Assessment Impaired Vision: No Impaired Hearing: No Decreased Hand dexterity: No Knowledge/Comprehension Assessment Knowledge Level: Medium Comprehension Level: Medium Ability to understand written Medium instructions: Ability to understand verbal Medium instructions: Motivation Assessment Anxiety Level: Calm Cooperation: Cooperative Education Importance: Acknowledges Need Interest in Health Problems: Asks Questions Perception: Coherent Willingness to Engage in Self- Medium Management Activities: Readiness to Engage in Self- Medium Management Activities: Electronic Signature(s) Signed: 08/09/2018 5:36:32 PM By: Curtis Sites Entered By: Curtis Sites on 08/09/2018 13:06:40 Johnathan Scott ([id number]) -------------------------------------------------------------------------------- Fall Risk  Assessment Details Patient Name: Johnathan Scott Date of Service: 08/09/2018 1:00 PM Medical Record Number: [id number] Patient Account Number: [id number] Date of Birth/Sex: 02/02/1966 (52 y.o. M) Treating RN: Curtis Sites Primary Care Jaira Canady: Birdie Sons, ERIC Other Clinician: Referring Addam Goeller: Jene Every Treating Clementina Mareno/Extender: Linwood Dibbles, HOYT Weeks in Treatment: 0 Fall Risk Assessment Items Have you had 2 or more falls in the last 12 monthso 0 No Have you had any fall that resulted in injury in the last 12 monthso 0 No FALL RISK ASSESSMENT: History of falling - immediate or within 3 months 0 No Secondary diagnosis 0 No Ambulatory aid None/bed rest/wheelchair/nurse 0 No Crutches/cane/walker 15 Yes Furniture 0 No IV Access/Saline Lock 0 No Gait/Training Normal/bed  rest/immobile 0 No Weak 10 Yes Impaired 20 Yes Mental Status Oriented to own ability 0 Yes Electronic Signature(s) Signed: 08/09/2018 5:36:32 PM By: Curtis Sites Entered By: Curtis Sites on 08/09/2018 13:06:52 Johnathan Scott ([id number]) -------------------------------------------------------------------------------- Foot Assessment Details Patient Name: Johnathan Scott Date of Service: 08/09/2018 1:00 PM Medical Record Number: [id number] Patient Account Number: [id number] Date of Birth/Sex: [birth year] (52 y.o. M) Treating RN: Curtis Sites Primary Care Kember Boch: Birdie Sons, ERIC Other Clinician: Referring Avory Rahimi: Jene Every Treating Melbert Botelho/Extender: STONE III, HOYT Weeks in Treatment: 0 Foot Assessment Items Site Locations + = Sensation present, - = Sensation absent, C = Callus, U = Ulcer R = Redness, W = Warmth, M = Maceration, PU = Pre-ulcerative lesion F = Fissure, S = Swelling, D = Dryness Assessment Right: Left: Other Deformity: No No Prior Foot Ulcer: No No Prior Amputation: No No Charcot Joint: No No Ambulatory Status: Ambulatory With Help Assistance Device: Cane Gait:  Academic librarian Signature(s) Signed: 08/09/2018 5:36:32 PM By: Curtis Sites Entered By: Curtis Sites on 08/09/2018 13:07:20 Johnathan Scott ([id number]) -------------------------------------------------------------------------------- Nutrition Risk Assessment Details Patient Name: Johnathan Scott Date of Service: 08/09/2018 1:00 PM Medical Record Number: [id number] Patient Account Number: [id number] Date of Birth/Sex: Jan 02, 1966 (52 y.o. M) Treating RN: Curtis Sites Primary Care Dollie Mayse: Birdie Sons, ERIC Other Clinician: Referring Argentina Kosch: Jene Every Treating Fleming Prill/Extender: STONE III, HOYT Weeks in Treatment: 0 Height (in): Weight (lbs): Body Mass Index (BMI): Nutrition Risk Assessment Items NUTRITION RISK SCREEN: I have an illness or condition that made me change the kind and/or amount of 0 No food I eat I eat fewer than two meals per day 0 No I eat few fruits and vegetables, or milk products 0 No I have three or more drinks of beer, liquor or wine almost every day 0 No I have tooth or mouth problems that make it hard for me to eat 0 No I don't always have enough money to buy the food I need 0 No I eat alone most of the time 0 No I take three or more different prescribed or over-the-counter drugs a day 1 Yes Without wanting to, I have lost or gained 10 pounds in the last six months 0 No I am not always physically able to shop, cook and/or feed myself 0 No Nutrition Protocols Good Risk Protocol 0 No interventions needed Moderate Risk Protocol Electronic Signature(s) Signed: 08/09/2018 5:36:32 PM By: Curtis Sites Entered By: Curtis Sites on 08/09/2018 13:06:59

## 2018-08-14 NOTE — Progress Notes (Signed)
WILBURT, MESSINA ([id number]) Visit Report for 08/09/2018 Allergy List Details Patient Name: Johnathan Scott Date of Service: 08/09/2018 1:00 PM Medical Record Number: [id number] Patient Account Number: [id number] Date of Birth/Sex: 28-Aug-1966 (52 y.o. M) Treating RN: Curtis Sites Primary Care Jamell Laymon: Birdie Sons, ERIC Other Clinician: Referring Ethie Curless: Jene Every Treating Daelen Belvedere/Extender: STONE III, HOYT Weeks in Treatment: 0 Allergies Active Allergies Novocain Allergy Notes Electronic Signature(s) Signed: 08/09/2018 5:36:32 PM By: Curtis Sites Entered By: Curtis Sites on 08/09/2018 13:05:46 Johnathan Scott ([id number]) -------------------------------------------------------------------------------- Arrival Information Details Patient Name: Johnathan Scott Date of Service: 08/09/2018 1:00 PM Medical Record Number: [id number] Patient Account Number: [id number] Date of Birth/Sex: [birth year] (52 y.o. M) Treating RN: Curtis Sites Primary Care Geraldina Parrott: Birdie Sons, ERIC Other Clinician: Referring Thaddeaus Monica: Jene Every Treating Arlyn Rhude/Extender: Linwood Dibbles, HOYT Weeks in Treatment: 0 Visit Information Patient Arrived: Cane Arrival Time: 13:04 Accompanied By: self Transfer Assistance: None Patient Identification Verified: Yes Secondary Verification Process Completed: Yes Electronic Signature(s) Signed: 08/09/2018 5:36:32 PM By: Curtis Sites Entered By: Curtis Sites on 08/09/2018 13:05:30 Johnathan Scott ([id number]) -------------------------------------------------------------------------------- Clinic Level of Care Assessment Details Patient Name: Johnathan Scott Date of Service: 08/09/2018 1:00 PM Medical Record Number: [id number] Patient Account Number: [id number] Date of Birth/Sex: 07/11/1966 (52 y.o. M) Treating RN: Huel Coventry Primary Care Reginald Mangels: Birdie Sons, ERIC Other Clinician: Referring Jakeisha Stricker: Jene Every Treating Orchid Glassberg/Extender:  Linwood Dibbles, HOYT Weeks in Treatment: 0 Clinic Level of Care Assessment Items TOOL 1 Quantity Score []  - Use when EandM and Procedure is performed on INITIAL visit 0 ASSESSMENTS - Nursing Assessment / Reassessment X - General Physical Exam (combine w/ comprehensive assessment (listed just below) when 1 20 performed on new pt. evals) X- 1 25 Comprehensive Assessment (HX, ROS, Risk Assessments, Wounds Hx, etc.) ASSESSMENTS - Wound and Skin Assessment / Reassessment []  - Dermatologic / Skin Assessment (not related to wound area) 0 ASSESSMENTS - Ostomy and/or Continence Assessment and Care []  - Incontinence Assessment and Management 0 []  - 0 Ostomy Care Assessment and Management (repouching, etc.) PROCESS - Coordination of Care X - Simple Patient / Family Education for ongoing care 1 15 []  - 0 Complex (extensive) Patient / Family Education for ongoing care X- 1 10 Staff obtains Chiropractor, Records, Test Results / Process Orders []  - 0 Staff telephones HHA, Nursing Homes / Clarify orders / etc []  - 0 Routine Transfer to another Facility (non-emergent condition) []  - 0 Routine Hospital Admission (non-emergent condition) X- 1 15 New Admissions / Manufacturing engineer / Ordering NPWT, Apligraf, etc. []  - 0 Emergency Hospital Admission (emergent condition) PROCESS - Special Needs []  - Pediatric / Minor Patient Management 0 []  - 0 Isolation Patient Management []  - 0 Hearing / Language / Visual special needs []  - 0 Assessment of Community assistance (transportation, D/C planning, etc.) []  - 0 Additional assistance / Altered mentation []  - 0 Support Surface(s) Assessment (bed, cushion, seat, etc.) Meske, Cam ([id number]) INTERVENTIONS - Miscellaneous []  - External ear exam 0 []  - 0 Patient Transfer (multiple staff / Nurse, adult / Similar devices) []  - 0 Simple Staple / Suture removal (25 or less) []  - 0 Complex Staple / Suture removal (26 or more) []  -  0 Hypo/Hyperglycemic Management (do not check if billed separately) X- 1 15 Ankle / Brachial Index (ABI) - do not check if billed separately Has the patient been seen at the hospital within the last three years: Yes Total Score: 100 Level Of Care: New/Established - Level 3 Electronic Signature(s) Signed: 08/09/2018 6:05:50 PM By: Elliot Gurney, BSN, RN,  CWS, Kim RN, BSN Entered By: Elliot Gurney, BSN, RN, CWS, Kim on 08/09/2018 13:45:43 Johnathan Scott ([id number]) -------------------------------------------------------------------------------- Encounter Discharge Information Details Patient Name: Johnathan Scott Date of Service: 08/09/2018 1:00 PM Medical Record Number: [id number] Patient Account Number: [id number] Date of Birth/Sex: May 26, 1966 (52 y.o. M) Treating RN: Curtis Sites Primary Care Preslynn Bier: Birdie Sons, ERIC Other Clinician: Referring Tria Noguera: Jene Every Treating Kyrillos Adams/Extender: Linwood Dibbles, HOYT Weeks in Treatment: 0 Encounter Discharge Information Items Discharge Condition: Stable Ambulatory Status: Cane Discharge Destination: Home Transportation: Private Auto Accompanied By: brother Schedule Follow-up Appointment: Yes Clinical Summary of Care: Post Procedure Vitals: Temperature (F): 98.0 Pulse (bpm): 64 Respiratory Rate (breaths/min): 18 Blood Pressure (mmHg): 165/82 Electronic Signature(s) Signed: 08/09/2018 5:36:32 PM By: Curtis Sites Entered By: Curtis Sites on 08/09/2018 13:59:17 Johnathan Scott ([id number]) -------------------------------------------------------------------------------- Lower Extremity Assessment Details Patient Name: Johnathan Scott Date of Service: 08/09/2018 1:00 PM Medical Record Number: [id number] Patient Account Number: [id number] Date of Birth/Sex: 1965/11/07 (52 y.o. M) Treating RN: Curtis Sites Primary Care Curtis Uriarte: Birdie Sons, ERIC Other Clinician: Referring Shamere Dilworth: Jene Every Treating Rook Maue/Extender: STONE III,  HOYT Weeks in Treatment: 0 Edema Assessment Assessed: [Left: No] [Right: No] [Left: Edema] [Right: :] Calf Left: Right: Point of Measurement: 34 cm From Medial Instep 53 cm 53 cm Ankle Left: Right: Point of Measurement: 10 cm From Medial Instep 27 cm 24.5 cm Vascular Assessment Pulses: Dorsalis Pedis Palpable: [Left:Yes] [Right:Yes] Doppler Audible: [Left:Yes] [Right:Yes] Posterior Tibial Palpable: [Left:Yes] [Right:Yes] Doppler Audible: [Left:Yes] [Right:Yes] Extremity colors, hair growth, and conditions: Extremity Color: [Left:Hyperpigmented] [Right:Hyperpigmented] Hair Growth on Extremity: [Left:No] [Right:No] Temperature of Extremity: [Left:Warm] [Right:Warm] Capillary Refill: [Left:< 3 seconds] [Right:< 3 seconds] Blood Pressure: Brachial: [Right:170] Dorsalis Pedis: 190 [Left:Dorsalis Pedis: 170] Ankle: Posterior Tibial: 200 [Left:Posterior Tibial: 180 1.18] [Right:1.06] Toe Nail Assessment Left: Right: Thick: Yes Yes Discolored: Yes Yes Deformed: Yes Yes Improper Length and Hygiene: Yes Yes Electronic Signature(s) Signed: 08/09/2018 5:36:32 PM By: Curtis Sites Entered By: Curtis Sites on 08/09/2018 13:22:16 Johnathan Scott ([id number]) Marion, Maisie Fus ([id number]) -------------------------------------------------------------------------------- Multi Wound Chart Details Patient Name: Johnathan Scott Date of Service: 08/09/2018 1:00 PM Medical Record Number: [id number] Patient Account Number: [id number] Date of Birth/Sex: [birth year] (52 y.o. M) Treating RN: Huel Coventry Primary Care Soloman Mckeithan: Birdie Sons, ERIC Other Clinician: Referring Krystol Rocco: Jene Every Treating Jamia Hoban/Extender: STONE III, HOYT Weeks in Treatment: 0 Vital Signs Height(in): 61 Pulse(bpm): 64 Weight(lbs): 232 Blood Pressure(mmHg): 165/80 Body Mass Index(BMI): 44 Temperature(F): 98.0 Respiratory Rate 16 (breaths/min): Photos: [N/A:N/A] Wound Location: Left Lower Leg -  Anterior N/A N/A Wounding Event: Gradually Appeared N/A N/A Primary Etiology: Venous Leg Ulcer N/A N/A Comorbid History: Asthma, Osteoarthritis, N/A N/A Seizure Disorder Date Acquired: 03/08/2018 N/A N/A Weeks of Treatment: 0 N/A N/A Wound Status: Open N/A N/A Measurements L x W x D 4.5x2.8x0.2 N/A N/A (cm) Area (cm) : 9.896 N/A N/A Volume (cm) : 1.979 N/A N/A Classification: Full Thickness Without N/A N/A Exposed Support Structures Exudate Amount: Large N/A N/A Exudate Type: Serous N/A N/A Exudate Color: amber N/A N/A Wound Margin: Flat and Intact N/A N/A Granulation Amount: Medium (34-66%) N/A N/A Granulation Quality: Red N/A N/A Necrotic Amount: Medium (34-66%) N/A N/A Exposed Structures: Fat Layer (Subcutaneous N/A N/A Tissue) Exposed: Yes Fascia: No Tendon: No Muscle: No Joint: No Bone: No Epithelialization: None N/A N/A Armendarez, Avedis ([id number]) Periwound Skin Texture: Excoriation: No N/A N/A Induration: No Callus: No Crepitus: No Rash: No Scarring: No Periwound Skin Moisture: Maceration: No N/A N/A Dry/Scaly: No Periwound Skin Color: Erythema: Yes N/A N/A Hemosiderin Staining: Yes Atrophie Blanche:  No Cyanosis: No Ecchymosis: No Mottled: No Pallor: No Rubor: No Erythema Location: Circumferential N/A N/A Temperature: No Abnormality N/A N/A Tenderness on Palpation: Yes N/A N/A Wound Preparation: Ulcer Cleansing: N/A N/A Rinsed/Irrigated with Saline Topical Anesthetic Applied: Other: lidocaine 4% Treatment Notes Electronic Signature(s) Signed: 08/09/2018 6:05:50 PM By: Elliot Gurney, BSN, RN, CWS, Kim RN, BSN Entered By: Elliot Gurney, BSN, RN, CWS, Kim on 08/09/2018 13:36:16 Johnathan Scott ([id number]) -------------------------------------------------------------------------------- Multi-Disciplinary Care Plan Details Patient Name: Johnathan Scott Date of Service: 08/09/2018 1:00 PM Medical Record Number: [id number] Patient Account Number:  [id number] Date of Birth/Sex: Jun 09, 1966 (52 y.o. M) Treating RN: Huel Coventry Primary Care Sole Lengacher: Birdie Sons, ERIC Other Clinician: Referring Javon Snee: Jene Every Treating Zael Shuman/Extender: Linwood Dibbles, HOYT Weeks in Treatment: 0 Active Inactive ` Necrotic Tissue Nursing Diagnoses: Knowledge deficit related to management of necrotic/devitalized tissue Goals: Necrotic/devitalized tissue will be minimized in the wound bed Date Initiated: 08/09/2018 Target Resolution Date: 09/09/2018 Goal Status: Active Interventions: Assess patient pain level pre-, during and post procedure and prior to discharge Treatment Activities: Apply topical anesthetic as ordered : 08/09/2018 Excisional debridement : 08/09/2018 Notes: ` Orientation to the Wound Care Program Nursing Diagnoses: Knowledge deficit related to the wound healing center program Goals: Patient/caregiver will verbalize understanding of the Wound Healing Center Program Date Initiated: 08/09/2018 Target Resolution Date: 09/09/2018 Goal Status: Active Interventions: Provide education on orientation to the wound center Notes: ` Wound/Skin Impairment Nursing Diagnoses: Impaired tissue integrity Knowledge deficit related to smoking impact on wound healing Goals: Johnathan Scott ([id number]) Ulcer/skin breakdown will have a volume reduction of 30% by week 4 Date Initiated: 08/09/2018 Target Resolution Date: 09/09/2018 Goal Status: Active Interventions: Assess patient/caregiver ability to obtain necessary supplies Assess ulceration(s) every visit Treatment Activities: Topical wound management initiated : 08/09/2018 Notes: Electronic Signature(s) Signed: 08/09/2018 6:05:50 PM By: Elliot Gurney, BSN, RN, CWS, Kim RN, BSN Entered By: Elliot Gurney, BSN, RN, CWS, Kim on 08/09/2018 13:35:58 Johnathan Scott ([id number]) -------------------------------------------------------------------------------- Pain Assessment Details Patient Name:  Johnathan Scott Date of Service: 08/09/2018 1:00 PM Medical Record Number: [id number] Patient Account Number: [id number] Date of Birth/Sex: 05-01-1966 (52 y.o. M) Treating RN: Curtis Sites Primary Care Ethen Bannan: Birdie Sons, ERIC Other Clinician: Referring Manraj Yeo: Jene Every Treating Naavya Postma/Extender: STONE III, HOYT Weeks in Treatment: 0 Active Problems Location of Pain Severity and Description of Pain Patient Has Paino Yes Site Locations Pain Location: Pain in Ulcers With Dressing Change: Yes Duration of the Pain. Constant / Intermittento Constant Pain Management and Medication Current Pain Management: Electronic Signature(s) Signed: 08/09/2018 5:36:32 PM By: Curtis Sites Entered By: Curtis Sites on 08/09/2018 13:14:28 Johnathan Scott ([id number]) -------------------------------------------------------------------------------- Patient/Caregiver Education Details Patient Name: Johnathan Scott Date of Service: 08/09/2018 1:00 PM Medical Record Number: [id number] Patient Account Number: [id number] Date of Birth/Gender: 02-11-1966 (52 y.o. M) Treating RN: Huel Coventry Primary Care Physician: Birdie Sons, ERIC Other Clinician: Referring Physician: Jene Every Treating Physician/Extender: Linwood Dibbles, HOYT Weeks in Treatment: 0 Education Assessment Education Provided To: Patient Education Topics Provided Welcome To The Wound Care Center: Handouts: Welcome To The Wound Care Center Methods: Demonstration, Explain/Verbal Responses: State content correctly Wound/Skin Impairment: Handouts: Caring for Your Ulcer Methods: Demonstration, Explain/Verbal Responses: State content correctly Electronic Signature(s) Signed: 08/09/2018 6:05:50 PM By: Elliot Gurney, BSN, RN, CWS, Kim RN, BSN Entered By: Elliot Gurney, BSN, RN, CWS, Kim on 08/09/2018 13:46:17 Johnathan Scott ([id number]) -------------------------------------------------------------------------------- Wound Assessment  Details Patient Name: Johnathan Scott Date of Service: 08/09/2018 1:00 PM Medical Record Number: [id number] Patient Account Number: [id number] Date of Birth/Sex: April 29, 1966 (52 y.o. M) Treating RN: Curtis Sites Primary  Care Cloe Sockwell: Birdie Sons, ERIC Other Clinician: Referring Charee Tumblin: Jene Every Treating Eltha Tingley/Extender: STONE III, HOYT Weeks in Treatment: 0 Wound Status Wound Number: 1 Primary Etiology: Venous Leg Ulcer Wound Location: Left Lower Leg - Anterior Wound Status: Open Wounding Event: Gradually Appeared Comorbid History: Asthma, Osteoarthritis, Seizure Disorder Date Acquired: 03/08/2018 Weeks Of Treatment: 0 Clustered Wound: No Photos Photo Uploaded By: Rema Jasmine on 08/09/2018 13:25:59 Wound Measurements Length: (cm) 4.5 Width: (cm) 2.8 Depth: (cm) 0.2 Area: (cm) 9.896 Volume: (cm) 1.979 % Reduction in Area: % Reduction in Volume: Epithelialization: None Tunneling: No Undermining: No Wound Description Full Thickness Without Exposed Support Foul Odo Classification: Structures Slough/F Wound Margin: Flat and Intact Exudate Large Amount: Exudate Type: Serous Exudate Color: amber r After Cleansing: No ibrino Yes Wound Bed Granulation Amount: Medium (34-66%) Exposed Structure Granulation Quality: Red Fascia Exposed: No Necrotic Amount: Medium (34-66%) Fat Layer (Subcutaneous Tissue) Exposed: Yes Necrotic Quality: Adherent Slough Tendon Exposed: No Muscle Exposed: No Joint Exposed: No Bone Exposed: No Fetsch, Wynter ([id number]) Periwound Skin Texture Texture Color No Abnormalities Noted: No No Abnormalities Noted: No Callus: No Atrophie Blanche: No Crepitus: No Cyanosis: No Excoriation: No Ecchymosis: No Induration: No Erythema: Yes Rash: No Erythema Location: Circumferential Scarring: No Hemosiderin Staining: Yes Mottled: No Moisture Pallor: No No Abnormalities Noted: No Rubor: No Dry / Scaly: No Maceration: No  Temperature / Pain Temperature: No Abnormality Tenderness on Palpation: Yes Wound Preparation Ulcer Cleansing: Rinsed/Irrigated with Saline Topical Anesthetic Applied: Other: lidocaine 4%, Treatment Notes Wound #1 (Left, Anterior Lower Leg) 1. Cleansed with: Clean wound with Normal Saline 2. Anesthetic Topical Lidocaine 4% cream to wound bed prior to debridement 4. Dressing Applied: Iodoflex 5. Secondary Dressing Applied ABD Pad Kerlix/Conform 7. Secured with Secretary/administrator) Signed: 08/09/2018 5:36:32 PM By: Curtis Sites Entered By: Curtis Sites on 08/09/2018 13:16:03 Johnathan Scott ([id number]) -------------------------------------------------------------------------------- Vitals Details Patient Name: Johnathan Scott Date of Service: 08/09/2018 1:00 PM Medical Record Number: [id number] Patient Account Number: [id number] Date of Birth/Sex: [birth year] (52 y.o. M) Treating RN: Curtis Sites Primary Care Junior Kenedy: Birdie Sons, ERIC Other Clinician: Referring Dodger Sinning: Jene Every Treating Donneisha Beane/Extender: STONE III, HOYT Weeks in Treatment: 0 Vital Signs Time Taken: 13:13 Temperature (F): 98.0 Height (in): 61 Pulse (bpm): 64 Source: Measured Respiratory Rate (breaths/min): 16 Weight (lbs): 232 Blood Pressure (mmHg): 165/80 Source: Measured Reference Range: 80 - 120 mg / dl Body Mass Index (BMI): 43.8 Electronic Signature(s) Signed: 08/09/2018 5:36:32 PM By: Curtis Sites Entered By: Curtis Sites on 08/09/2018 13:14:01

## 2018-08-14 NOTE — Progress Notes (Signed)
Johnathan Scott, Johnathan Scott ([id number]) Visit Report for 08/09/2018 Chief Complaint Document Details Patient Name: Johnathan Scott Date of Service: 08/09/2018 1:00 PM Medical Record Number: [id number] Patient Account Number: [id number] Date of Birth/Sex: [birth year] (52 y.o. M) Treating RN: Huel Coventry Primary Care Provider: Birdie Sons, ERIC Other Clinician: Referring Provider: Jene Every Treating Provider/Extender: Linwood Dibbles, HOYT Weeks in Treatment: 0 Information Obtained from: Patient Chief Complaint Left Anterior LE Ulcer Electronic Signature(s) Signed: 08/13/2018 8:35:57 AM By: Lenda Kelp PA-C Entered By: Lenda Kelp on 08/09/2018 13:52:17 Johnathan Scott ([id number]) -------------------------------------------------------------------------------- Debridement Details Patient Name: Johnathan Scott Date of Service: 08/09/2018 1:00 PM Medical Record Number: [id number] Patient Account Number: [id number] Date of Birth/Sex: 07/15/1966 (52 y.o. M) Treating RN: Huel Coventry Primary Care Provider: Birdie Sons, ERIC Other Clinician: Referring Provider: Jene Every Treating Provider/Extender: Linwood Dibbles, HOYT Weeks in Treatment: 0 Debridement Performed for Wound #1 Left,Anterior Lower Leg Assessment: Performed By: Physician STONE III, HOYT E., PA-C Debridement Type: Debridement Severity of Tissue Pre Fat layer exposed Debridement: Level of Consciousness (Pre- Awake and Alert procedure): Pre-procedure Verification/Time Yes - 13:39 Out Taken: Start Time: 13:39 Pain Control: Lidocaine Total Area Debrided (L x W): 4.5 (cm) x 2.8 (cm) = 12.6 (cm) Tissue and other material Viable, Non-Viable, Slough, Subcutaneous, Skin: Epidermis, Slough debrided: Level: Skin/Subcutaneous Tissue Debridement Description: Excisional Instrument: Curette Bleeding: Minimum Hemostasis Achieved: Pressure End Time: 13:40 Procedural Pain: 2 Post Procedural Pain: 0 Response to Treatment: Procedure  was tolerated well Level of Consciousness Awake and Alert (Post-procedure): Post Debridement Measurements of Total Wound Length: (cm) 4.5 Width: (cm) 2.8 Depth: (cm) 0.4 Volume: (cm) 3.958 Character of Wound/Ulcer Post Debridement: Stable Severity of Tissue Post Debridement: Fat layer exposed Post Procedure Diagnosis Same as Pre-procedure Electronic Signature(s) Signed: 08/09/2018 6:05:50 PM By: Elliot Gurney, BSN, RN, CWS, Kim RN, BSN Signed: 08/13/2018 8:35:57 AM By: Lenda Kelp PA-C Entered By: Elliot Gurney, BSN, RN, CWS, Kim on 08/09/2018 13:40:53 Johnathan Scott ([id number]) -------------------------------------------------------------------------------- HPI Details Patient Name: Johnathan Scott Date of Service: 08/09/2018 1:00 PM Medical Record Number: [id number] Patient Account Number: [id number] Date of Birth/Sex: 01/03/1966 (52 y.o. M) Treating RN: Huel Coventry Primary Care Provider: Birdie Sons, ERIC Other Clinician: Referring Provider: Jene Every Treating Provider/Extender: Linwood Dibbles, HOYT Weeks in Treatment: 0 History of Present Illness HPI Description: 08/09/18 on evaluation today patient presents for initial evaluation and clinic as referral concerning an ulcer that he has on the left anterior lower [place] which has been present for five months. He states that he actually noticed this after having an "insect bite" while driving down the road. He states that he did not see what kind of insect it was but he felt it wrong up his leg and then have the bite. Since that time is been having issues with the fact that this area does not want to heal. He does have a history of asthma as well as anemia is most recent CBC revealed a hemoglobin of 11.1 with a hematocrit of 34.6. He does have a history of panic attacks as well intermittently. No fevers, chills, nausea, or vomiting noted at this time. Specifically the patient does not appear to have any infection in regard to the left  lower extremity. He's not on the current antibiotics. Electronic Signature(s) Signed: 08/13/2018 8:35:57 AM By: Lenda Kelp PA-C Entered By: Lenda Kelp on 08/09/2018 13:54:21 Johnathan Scott ([id number]) -------------------------------------------------------------------------------- Physical Exam Details Patient Name: Johnathan Scott Date of Service: 08/09/2018 1:00 PM Medical Record Number: [id number] Patient Account Number: [id number] Date of Birth/Sex: Feb 21, 1966 (  52 y.o. M) Treating RN: Huel Coventry Primary Care Provider: Birdie Sons, ERIC Other Clinician: Referring Provider: Jene Every Treating Provider/Extender: STONE III, HOYT Weeks in Treatment: 0 Constitutional patient is hypertensive.. pulse regular and within target range for patient.Marland Kitchen respirations regular, non-labored and within target range for patient.Marland Kitchen temperature within target range for patient.. Well-nourished and well-hydrated in no acute distress. Eyes conjunctiva clear no eyelid edema noted. pupils equal round and reactive to light and accommodation. Ears, Nose, Mouth, and Throat no gross abnormality of ear auricles or external auditory canals. normal hearing noted during conversation. mucus membranes moist. Respiratory normal breathing without difficulty. clear to auscultation bilaterally. Cardiovascular regular rate and rhythm with normal S1, S2. 2+ dorsalis pedis/posterior tibialis pulses. no clubbing, cyanosis, significant edema, <3 sec cap refill. Gastrointestinal (GI) soft, non-tender, non-distended, +BS. no ventral hernia noted. Musculoskeletal Patient unable to walk without assistance using a cane. no significant deformity or arthritic changes, no loss or range of motion, no clubbing. Psychiatric this patient is able to make decisions and demonstrates good insight into disease process. Alert and Oriented x 3. pleasant and cooperative. Notes My suggestion currently is going to be in regard  to the patient's wound that we go ahead and see about debridement in order to clear away the necrotic tissue. He is in agreement with the plan. With that being said this was performed today after discussion with the patient. Unfortunately he did have discomfort he was able to proceed with the debridement without complication. He seems to have excellent blood flow which is also good news. With that being said he did have discomfort and really did not seem to appreciate the debridement. I had alerted him that this was something I'll be see that we do need to do in order to clean up the wound to allow it to heal. Also suggested that we really need to see about compression therapy he states he cannot where compression stockings because he cannot bend over to put them on. Electronic Signature(s) Signed: 08/13/2018 8:35:57 AM By: Lenda Kelp PA-C Entered By: Lenda Kelp on 08/09/2018 13:55:46 Johnathan Scott ([id number]) -------------------------------------------------------------------------------- Physician Orders Details Patient Name: Johnathan Scott Date of Service: 08/09/2018 1:00 PM Medical Record Number: [id number] Patient Account Number: [id number] Date of Birth/Sex: 1966/07/10 (52 y.o. M) Treating RN: Huel Coventry Primary Care Provider: Birdie Sons, ERIC Other Clinician: Referring Provider: Jene Every Treating Provider/Extender: Linwood Dibbles, HOYT Weeks in Treatment: 0 Verbal / Phone Orders: No Diagnosis Coding ICD-10 Coding Code Description I87.2 Venous insufficiency (chronic) (peripheral) L97.822 Non-pressure chronic ulcer of other part of left lower leg with fat layer exposed D50.9 Iron deficiency anemia, unspecified J45.998 Other asthma Wound Cleansing Wound #1 Left,Anterior Lower Leg o Clean wound with Normal Saline. Anesthetic (add to Medication List) Wound #1 Left,Anterior Lower Leg o Topical Lidocaine 4% cream applied to wound bed prior to debridement (In  Clinic Only). Primary Wound Dressing Wound #1 Left,Anterior Lower Leg o Iodoflex Secondary Dressing o ABD and Kerlix/Conform Dressing Change Frequency Wound #1 Left,Anterior Lower Leg o Change Dressing Monday, Wednesday, Friday Follow-up Appointments Wound #1 Left,Anterior Lower Leg o Return Appointment in 1 week. Edema Control Wound #1 Left,Anterior Lower Leg o Elevate legs to the level of the heart and pump ankles as often as possible Electronic Signature(s) Signed: 08/09/2018 6:05:50 PM By: Elliot Gurney, BSN, RN, CWS, Kim RN, BSN Signed: 08/13/2018 8:35:57 AM By: Lenda Kelp PA-C Entered By: Elliot Gurney, BSN, RN, CWS, Kim on 08/09/2018 13:44:59 Johnathan Scott ([id number]) -------------------------------------------------------------------------------- Problem List Details Patient  Name: DORSEY, AUTHEMENT Date of Service: 08/09/2018 1:00 PM Medical Record Number: [id number] Patient Account Number: [id number] Date of Birth/Sex: [birth year] (52 y.o. M) Treating RN: Huel Coventry Primary Care Provider: Birdie Sons, ERIC Other Clinician: Referring Provider: Jene Every Treating Provider/Extender: Linwood Dibbles, HOYT Weeks in Treatment: 0 Active Problems ICD-10 Evaluated Encounter Code Description Active Date Today Diagnosis I87.2 Venous insufficiency (chronic) (peripheral) 08/09/2018 No Yes L97.822 Non-pressure chronic ulcer of other part of left lower leg with 08/09/2018 No Yes fat layer exposed D50.9 Iron deficiency anemia, unspecified 08/09/2018 No Yes J45.998 Other asthma 08/09/2018 No Yes Inactive Problems Resolved Problems Electronic Signature(s) Signed: 08/13/2018 8:35:57 AM By: Lenda Kelp PA-C Entered By: Lenda Kelp on 08/09/2018 13:32:44 Johnathan Scott ([id number]) -------------------------------------------------------------------------------- Progress Note Details Patient Name: Johnathan Scott Date of Service: 08/09/2018 1:00 PM Medical Record Number:  [id number] Patient Account Number: [id number] Date of Birth/Sex: 1966/08/18 (52 y.o. M) Treating RN: Huel Coventry Primary Care Provider: Birdie Sons, ERIC Other Clinician: Referring Provider: Jene Every Treating Provider/Extender: Linwood Dibbles, HOYT Weeks in Treatment: 0 Subjective Chief Complaint Information obtained from Patient Left Anterior LE Ulcer History of Present Illness (HPI) 08/09/18 on evaluation today patient presents for initial evaluation and clinic as referral concerning an ulcer that he has on the left anterior lower [place] which has been present for five months. He states that he actually noticed this after having an "insect bite" while driving down the road. He states that he did not see what kind of insect it was but he felt it wrong up his leg and then have the bite. Since that time is been having issues with the fact that this area does not want to heal. He does have a history of asthma as well as anemia is most recent CBC revealed a hemoglobin of 11.1 with a hematocrit of 34.6. He does have a history of panic attacks as well intermittently. No fevers, chills, nausea, or vomiting noted at this time. Specifically the patient does not appear to have any infection in regard to the left lower extremity. He's not on the current antibiotics. Wound History Patient presents with 1 open wound that has been present for approximately 5 months. Patient has been treating wound in the following manner: open to air. Laboratory tests have not been performed in the last month. Patient reportedly has not tested positive for an antibiotic resistant organism. Patient reportedly has not tested positive for osteomyelitis. Patient reportedly has not had testing performed to evaluate circulation in the legs. Patient experiences the following problems associated with their wounds: swelling. Patient History Information obtained from Patient. Allergies Novocain Family History Cancer -  Mother,Father,Siblings, Diabetes - Siblings, Heart Disease - Siblings, Hypertension - Siblings, No family history of Hereditary Spherocytosis, Kidney Disease, Lung Disease, Seizures, Stroke, Thyroid Problems, Tuberculosis. Social History Former smoker - 7 years, Marital Status - Single, Alcohol Use - Rarely, Drug Use - No History, Caffeine Use - Never. Medical History Eyes Denies history of Cataracts, Glaucoma, Optic Neuritis Ear/Nose/Mouth/Throat Denies history of Chronic sinus problems/congestion, Middle ear problems Hematologic/Lymphatic Denies history of Anemia, Hemophilia, Human Immunodeficiency Virus, Lymphedema, Sickle Cell Disease Respiratory Patient has history of Asthma - history as a child Denies history of Aspiration, Chronic Obstructive Pulmonary Disease (COPD), Pneumothorax, Sleep Apnea, Tuberculosis Wence, Maisie Fus ([id number]) Cardiovascular Denies history of Angina, Arrhythmia, Congestive Heart Failure, Coronary Artery Disease, Deep Vein Thrombosis, Hypertension, Hypotension, Myocardial Infarction, Peripheral Arterial Disease, Peripheral Venous Disease, Phlebitis, Vasculitis Gastrointestinal Denies history of Cirrhosis , Colitis, Crohn s, Hepatitis A, Hepatitis B,  Hepatitis C Endocrine Denies history of Type I Diabetes, Type II Diabetes Genitourinary Denies history of End Stage Renal Disease Immunological Denies history of Lupus Erythematosus, Raynaud s, Scleroderma Integumentary (Skin) Denies history of History of Burn, History of pressure wounds Musculoskeletal Patient has history of Osteoarthritis - both hips Denies history of Gout, Rheumatoid Arthritis, Osteomyelitis Neurologic Patient has history of Seizure Disorder - as a child Denies history of Dementia, Neuropathy, Quadriplegia, Paraplegia Oncologic Denies history of Received Chemotherapy, Received Radiation Psychiatric Denies history of Anorexia/bulimia, Confinement Anxiety Review of Systems  (ROS) Constitutional Symptoms (General Health) Denies complaints or symptoms of Fatigue, Fever, Chills, Marked Weight Change. Eyes Denies complaints or symptoms of Dry Eyes, Vision Changes, Glasses / Contacts. Ear/Nose/Mouth/Throat Denies complaints or symptoms of Difficult clearing ears, Sinusitis. Hematologic/Lymphatic Denies complaints or symptoms of Bleeding / Clotting Disorders, Human Immunodeficiency Virus. Respiratory Denies complaints or symptoms of Chronic or frequent coughs, Shortness of Breath. Cardiovascular Complains or has symptoms of LE edema. Denies complaints or symptoms of Chest pain. Gastrointestinal Denies complaints or symptoms of Frequent diarrhea, Nausea, Vomiting. Endocrine Denies complaints or symptoms of Hepatitis, Thyroid disease, Polydypsia (Excessive Thirst). Genitourinary Denies complaints or symptoms of Kidney failure/ Dialysis, Incontinence/dribbling. Immunological Denies complaints or symptoms of Hives, Itching. Integumentary (Skin) Complains or has symptoms of Wounds, Swelling. Denies complaints or symptoms of Bleeding or bruising tendency, Breakdown. Musculoskeletal Denies complaints or symptoms of Muscle Pain, Muscle Weakness. Neurologic Denies complaints or symptoms of Numbness/parasthesias, Focal/Weakness. Psychiatric Denies complaints or symptoms of Anxiety, Claustrophobia. TERREN, JANDREAU ([id number]) Objective Constitutional patient is hypertensive.. pulse regular and within target range for patient.Marland Kitchen respirations regular, non-labored and within target range for patient.Marland Kitchen temperature within target range for patient.. Well-nourished and well-hydrated in no acute distress. Vitals Time Taken: 1:13 PM, Height: 61 in, Source: Measured, Weight: 232 lbs, Source: Measured, BMI: 43.8, Temperature: 98.0 F, Pulse: 64 bpm, Respiratory Rate: 16 breaths/min, Blood Pressure: 165/80 mmHg. Eyes conjunctiva clear no eyelid edema noted. pupils equal  round and reactive to light and accommodation. Ears, Nose, Mouth, and Throat no gross abnormality of ear auricles or external auditory canals. normal hearing noted during conversation. mucus membranes moist. Respiratory normal breathing without difficulty. clear to auscultation bilaterally. Cardiovascular regular rate and rhythm with normal S1, S2. 2+ dorsalis pedis/posterior tibialis pulses. no clubbing, cyanosis, significant edema, Gastrointestinal (GI) soft, non-tender, non-distended, +BS. no ventral hernia noted. Musculoskeletal Patient unable to walk without assistance using a cane. no significant deformity or arthritic changes, no loss or range of motion, no clubbing. Psychiatric this patient is able to make decisions and demonstrates good insight into disease process. Alert and Oriented x 3. pleasant and cooperative. General Notes: My suggestion currently is going to be in regard to the patient's wound that we go ahead and see about debridement in order to clear away the necrotic tissue. He is in agreement with the plan. With that being said this was performed today after discussion with the patient. Unfortunately he did have discomfort he was able to proceed with the debridement without complication. He seems to have excellent blood flow which is also good news. With that being said he did have discomfort and really did not seem to appreciate the debridement. I had alerted him that this was something I'll be see that we do need to do in order to clean up the wound to allow it to heal. Also suggested that we really need to see about compression therapy he states he cannot where compression stockings because he cannot bend over  to put them on. Integumentary (Hair, Skin) Wound #1 status is Open. Original cause of wound was Gradually Appeared. The wound is located on the Left,Anterior Lower Leg. The wound measures 4.5cm length x 2.8cm width x 0.2cm depth; 9.896cm^2 area and 1.979cm^3  volume. There is Fat Layer (Subcutaneous Tissue) Exposed exposed. There is no tunneling or undermining noted. There is a large amount of serous drainage noted. The wound margin is flat and intact. There is medium (34-66%) red granulation within the wound bed. There is a medium (34-66%) amount of necrotic tissue within the wound bed including Adherent Slough. The periwound skin appearance exhibited: Hemosiderin Staining, Erythema. The periwound skin appearance did not exhibit: Callus, Crepitus, Excoriation, Induration, Rash, Scarring, Dry/Scaly, Maceration, Atrophie Blanche, Cyanosis, Ecchymosis, Mottled, Pallor, Rubor. The surrounding wound skin color is noted with erythema which is circumferential. Periwound temperature was noted Tumminello, Moss ([id number]) as No Abnormality. The periwound has tenderness on palpation. Assessment Active Problems ICD-10 Venous insufficiency (chronic) (peripheral) Non-pressure chronic ulcer of other part of left lower leg with fat layer exposed Iron deficiency anemia, unspecified Other asthma Procedures Wound #1 Pre-procedure diagnosis of Wound #1 is a Venous Leg Ulcer located on the Left,Anterior Lower Leg .Severity of Tissue Pre Debridement is: Fat layer exposed. There was a Excisional Skin/Subcutaneous Tissue Debridement with a total area of 12.6 sq cm performed by STONE III, HOYT E., PA-C. With the following instrument(s): Curette to remove Viable and Non-Viable tissue/material. Material removed includes Subcutaneous Tissue, Slough, and Skin: Epidermis after achieving pain control using Lidocaine. No specimens were taken. A time out was conducted at 13:39, prior to the start of the procedure. A Minimum amount of bleeding was controlled with Pressure. The procedure was tolerated well with a pain level of 2 throughout and a pain level of 0 following the procedure. Post Debridement Measurements: 4.5cm length x 2.8cm width x 0.4cm depth; 3.958cm^3  volume. Character of Wound/Ulcer Post Debridement is stable. Severity of Tissue Post Debridement is: Fat layer exposed. Post procedure Diagnosis Wound #1: Same as Pre-Procedure Plan Wound Cleansing: Wound #1 Left,Anterior Lower Leg: Clean wound with Normal Saline. Anesthetic (add to Medication List): Wound #1 Left,Anterior Lower Leg: Topical Lidocaine 4% cream applied to wound bed prior to debridement (In Clinic Only). Primary Wound Dressing: Wound #1 Left,Anterior Lower Leg: Iodoflex Secondary Dressing: ABD and Kerlix/Conform Dressing Change Frequency: Wound #1 Left,Anterior Lower Leg: Change Dressing Monday, Wednesday, Friday Follow-up Appointments: Wound #1 Left,Anterior Lower Leg: Return Appointment in 1 week. BRONSON, BRESSMAN ([id number]) Edema Control: Wound #1 Left,Anterior Lower Leg: Elevate legs to the level of the heart and pump ankles as often as possible At this time I did not compression wrap this patient has he's not even sure based on what is telling me today that he will be coming back. He states that the debridement hurt and he was better off and fine with that "scabbing over". I explained that this is not really a normal scab that is able to heal underneath it which is collecting a lot of fluid and obviously that's why it has been five months that this is not healing. I think that the appropriate treatment measures would be to initiate the wound care measures above although I do think the compression wrap would also be of benefit for him. We will hold off on that this week however because his jeans are somewhat tight and he states that's pretty much what he wears each day. I'm not sure that he's gonna be able to compression  wrap in general to be honest. I'm hopeful that the Iodoflex will be beneficial for him. I will see him back for a fault visit in weeks time. Please see above for specific wound care orders. We will see patient for re-evaluation in 1 week(s) here  in the clinic. If anything worsens or changes patient will contact our office for additional recommendations. Electronic Signature(s) Signed: 08/13/2018 8:35:57 AM By: Lenda Kelp PA-C Entered By: Lenda Kelp on 08/09/2018 13:59:09 Johnathan Scott ([id number]) -------------------------------------------------------------------------------- ROS/PFSH Details Patient Name: Johnathan Scott Date of Service: 08/09/2018 1:00 PM Medical Record Number: [id number] Patient Account Number: [id number] Date of Birth/Sex: 07/15/1966 (52 y.o. M) Treating RN: Curtis Sites Primary Care Provider: Birdie Sons, ERIC Other Clinician: Referring Provider: Jene Every Treating Provider/Extender: STONE III, HOYT Weeks in Treatment: 0 Information Obtained From Patient Wound History Do you currently have one or more open woundso Yes How many open wounds do you currently haveo 1 Approximately how long have you had your woundso 5 months How have you been treating your wound(s) until nowo open to air Has your wound(s) ever healed and then re-openedo No Have you had any lab work done in the past montho No Have you tested positive for an antibiotic resistant organism (MRSA, VRE)o No Have you tested positive for osteomyelitis (bone infection)o No Have you had any tests for circulation on your legso No Have you had other problems associated with your woundso Swelling Constitutional Symptoms (General Health) Complaints and Symptoms: Negative for: Fatigue; Fever; Chills; Marked Weight Change Eyes Complaints and Symptoms: Negative for: Dry Eyes; Vision Changes; Glasses / Contacts Medical History: Negative for: Cataracts; Glaucoma; Optic Neuritis Ear/Nose/Mouth/Throat Complaints and Symptoms: Negative for: Difficult clearing ears; Sinusitis Medical History: Negative for: Chronic sinus problems/congestion; Middle ear problems Hematologic/Lymphatic Complaints and Symptoms: Negative for: Bleeding /  Clotting Disorders; Human Immunodeficiency Virus Medical History: Negative for: Anemia; Hemophilia; Human Immunodeficiency Virus; Lymphedema; Sickle Cell Disease Respiratory Complaints and Symptoms: Negative for: Chronic or frequent coughs; Shortness of Breath Medical History: Positive for: Asthma - history as a child JERIEL, VIVANCO ([id number]) Negative for: Aspiration; Chronic Obstructive Pulmonary Disease (COPD); Pneumothorax; Sleep Apnea; Tuberculosis Cardiovascular Complaints and Symptoms: Positive for: LE edema Negative for: Chest pain Medical History: Negative for: Angina; Arrhythmia; Congestive Heart Failure; Coronary Artery Disease; Deep Vein Thrombosis; Hypertension; Hypotension; Myocardial Infarction; Peripheral Arterial Disease; Peripheral Venous Disease; Phlebitis; Vasculitis Gastrointestinal Complaints and Symptoms: Negative for: Frequent diarrhea; Nausea; Vomiting Medical History: Negative for: Cirrhosis ; Colitis; Crohnos; Hepatitis A; Hepatitis B; Hepatitis C Endocrine Complaints and Symptoms: Negative for: Hepatitis; Thyroid disease; Polydypsia (Excessive Thirst) Medical History: Negative for: Type I Diabetes; Type II Diabetes Genitourinary Complaints and Symptoms: Negative for: Kidney failure/ Dialysis; Incontinence/dribbling Medical History: Negative for: End Stage Renal Disease Immunological Complaints and Symptoms: Negative for: Hives; Itching Medical History: Negative for: Lupus Erythematosus; Raynaudos; Scleroderma Integumentary (Skin) Complaints and Symptoms: Positive for: Wounds; Swelling Negative for: Bleeding or bruising tendency; Breakdown Medical History: Negative for: History of Burn; History of pressure wounds Musculoskeletal Complaints and Symptoms: Negative for: Muscle Pain; Muscle Weakness Medical HistoryJARIS, KOHLES ([id number]) Positive for: Osteoarthritis - both hips Negative for: Gout; Rheumatoid Arthritis;  Osteomyelitis Neurologic Complaints and Symptoms: Negative for: Numbness/parasthesias; Focal/Weakness Medical History: Positive for: Seizure Disorder - as a child Negative for: Dementia; Neuropathy; Quadriplegia; Paraplegia Psychiatric Complaints and Symptoms: Negative for: Anxiety; Claustrophobia Medical History: Negative for: Anorexia/bulimia; Confinement Anxiety Oncologic Medical History: Negative for: Received Chemotherapy; Received Radiation Immunizations Pneumococcal Vaccine: Received Pneumococcal Vaccination: No Implantable Devices Family  and Social History Cancer: Yes - Mother,Father,Siblings; Diabetes: Yes - Siblings; Heart Disease: Yes - Siblings; Hereditary Spherocytosis: No; Hypertension: Yes - Siblings; Kidney Disease: No; Lung Disease: No; Seizures: No; Stroke: No; Thyroid Problems: No; Tuberculosis: No; Former smoker - 7 years; Marital Status - Single; Alcohol Use: Rarely; Drug Use: No History; Caffeine Use: Never; Financial Concerns: No; Food, Clothing or Shelter Needs: No; Support System Lacking: No; Transportation Concerns: No; Advanced Directives: No; Patient does not want information on Advanced Directives Electronic Signature(s) Signed: 08/09/2018 5:36:32 PM By: Curtis Sites Signed: 08/13/2018 8:35:57 AM By: Lenda Kelp PA-C Entered By: Curtis Sites on 08/09/2018 13:13:05 Johnathan Scott ([id number]) -------------------------------------------------------------------------------- SuperBill Details Patient Name: Johnathan Scott Date of Service: 08/09/2018 Medical Record Number: [id number] Patient Account Number: [id number] Date of Birth/Sex: 16-Jan-1966 (52 y.o. M) Treating RN: Huel Coventry Primary Care Provider: Birdie Sons, ERIC Other Clinician: Referring Provider: Jene Every Treating Provider/Extender: Linwood Dibbles, HOYT Weeks in Treatment: 0 Diagnosis Coding ICD-10 Codes Code Description I87.2 Venous insufficiency (chronic) (peripheral) L97.822  Non-pressure chronic ulcer of other part of left lower leg with fat layer exposed D50.9 Iron deficiency anemia, unspecified J45.998 Other asthma Facility Procedures CPT4 Code Description: 25366440 99213 - WOUND CARE VISIT-LEV 3 EST PT Modifier: Quantity: 1 CPT4 Code Description: [id number] 11042 - DEB SUBQ TISSUE 20 SQ CM/< ICD-10 Diagnosis Description L97.822 Non-pressure chronic ulcer of other part of left lower leg with Modifier: fat layer expos Quantity: 1 ed Physician Procedures CPT4 Code Description: [id number] WC PHYS LEVEL 3 o NEW PT ICD-10 Diagnosis Description I87.2 Venous insufficiency (chronic) (peripheral) L97.822 Non-pressure chronic ulcer of other part of left lower leg with D50.9 Iron deficiency anemia, unspecified  J45.998 Other asthma Modifier: 25 fat layer expos Quantity: 1 ed CPT4 Code Description: 3329518 11042 - WC PHYS SUBQ TISS 20 SQ CM ICD-10 Diagnosis Description L97.822 Non-pressure chronic ulcer of other part of left lower leg with Modifier: fat layer expos Quantity: 1 ed Electronic Signature(s) Signed: 08/13/2018 8:35:57 AM By: Lenda Kelp PA-C Entered By: Lenda Kelp on 08/09/2018 13:59:27

## 2018-08-16 ENCOUNTER — Encounter: Payer: 59 | Admitting: Physician Assistant

## 2018-08-16 DIAGNOSIS — L97822 Non-pressure chronic ulcer of other part of left lower leg with fat layer exposed: Secondary | ICD-10-CM | POA: Diagnosis not present

## 2018-08-16 DIAGNOSIS — I872 Venous insufficiency (chronic) (peripheral): Secondary | ICD-10-CM | POA: Diagnosis not present

## 2018-08-21 NOTE — Progress Notes (Signed)
LINO, WICKLIFF ([id number]) Visit Report for 08/19/2018 Arrival Information Details Patient Name: Johnathan Scott, Johnathan Scott Date of Service: 08/19/2018 2:15 PM Medical Record Number: [id number] Patient Account Number: [id number] Date of Birth/Sex: 02/09/1966 (52 y.o. M) Treating RN: Huel Coventry Primary Care Amaris Garrette: Birdie Sons, ERIC Other Clinician: Referring Mariska Daffin: Birdie Sons, ERIC Treating Jayshon Dommer/Extender: Linwood Dibbles, HOYT Weeks in Treatment: 1 Visit Information History Since Last Visit Added or deleted any medications: No Patient Arrived: Ambulatory Any new allergies or adverse reactions: No Arrival Time: 14:17 Had a fall or experienced change in No Accompanied By: self activities of daily living that may affect Transfer Assistance: Manual risk of falls: Patient Identification Verified: Yes Signs or symptoms of abuse/neglect since last visito No Secondary Verification Process Completed: Yes Hospitalized since last visit: No Implantable device outside of the clinic excluding No cellular tissue based products placed in the center since last visit: Has Dressing in Place as Prescribed: Yes Has Compression in Place as Prescribed: Yes Pain Present Now: No Electronic Signature(s) Signed: 08/20/2018 3:46:30 PM By: Elliot Gurney, BSN, RN, CWS, Kim RN, BSN Entered By: Elliot Gurney, BSN, RN, CWS, Kim on 08/19/2018 14:18:23 Johnathan Scott ([id number]) -------------------------------------------------------------------------------- Wound Assessment Details Patient Name: Johnathan Scott Date of Service: 08/19/2018 2:15 PM Medical Record Number: [id number] Patient Account Number: [id number] Date of Birth/Sex: 10-19-1966 (52 y.o. M) Treating RN: Huel Coventry Primary Care Laurier Jasperson: Birdie Sons, ERIC Other Clinician: Referring Kinzy Weyers: Birdie Sons, ERIC Treating Lorenzo Arscott/Extender: Linwood Dibbles, HOYT Weeks in Treatment: 1 Wound Status Wound Number: 1 Primary Etiology: Venous Leg Ulcer Wound Location: Left,  Anterior Lower Leg Wound Status: Open Wounding Event: Gradually Appeared Date Acquired: 03/08/2018 Weeks Of Treatment: 1 Clustered Wound: No Wound Measurements Length: (cm) 4.8 Width: (cm) 2.8 Depth: (cm) 0.1 Area: (cm) 10.556 Volume: (cm) 1.056 % Reduction in Area: -6.7% % Reduction in Volume: 46.6% Wound Description Full Thickness Without Exposed Support Classification: Structures Periwound Skin Texture Texture Color No Abnormalities Noted: No No Abnormalities Noted: No Moisture No Abnormalities Noted: No Electronic Signature(s) Signed: 08/20/2018 3:46:30 PM By: Elliot Gurney, BSN, RN, CWS, Kim RN, BSN Entered By: Elliot Gurney, BSN, RN, CWS, Kim on 08/19/2018 14:18:59

## 2018-08-21 NOTE — Progress Notes (Signed)
Johnathan Scott, Johnathan Scott ([id number]) Visit Report for 08/16/2018 Chief Complaint Document Details Patient Name: Johnathan Scott, Johnathan Scott Date of Service: 08/16/2018 2:00 PM Medical Record Number: [id number] Patient Account Number: [id number] Date of Birth/Sex: 01-Dec-1965 (51 y.o. M) Treating RN: Huel Coventry Primary Care Provider: Birdie Sons, ERIC Other Clinician: Referring Provider: Birdie Sons, ERIC Treating Provider/Extender: Linwood Dibbles, Kaiyu Mirabal Weeks in Treatment: 1 Information Obtained from: Patient Chief Complaint Left Anterior LE Ulcer Electronic Signature(s) Signed: 08/18/2018 7:47:50 PM By: Lenda Kelp PA-C Entered By: Lenda Kelp on 08/16/2018 13:56:34 Johnathan Scott ([id number]) -------------------------------------------------------------------------------- Debridement Details Patient Name: Johnathan Scott Date of Service: 08/16/2018 2:00 PM Medical Record Number: [id number] Patient Account Number: [id number] Date of Birth/Sex: Jan 23, 1966 (52 y.o. M) Treating RN: Huel Coventry Primary Care Provider: Birdie Sons, ERIC Other Clinician: Referring Provider: Birdie Sons, ERIC Treating Provider/Extender: Linwood Dibbles, Parish Dubose Weeks in Treatment: 1 Debridement Performed for Wound #1 Left,Anterior Lower Leg Assessment: Performed By: Physician STONE III, Leeah Politano E., PA-C Debridement Type: Debridement Severity of Tissue Pre Fat layer exposed Debridement: Level of Consciousness (Pre- Awake and Alert procedure): Pre-procedure Verification/Time Yes - 14:47 Out Taken: Start Time: 14:47 Pain Control: Lidocaine Total Area Debrided (L x W): 4.8 (cm) x 3 (cm) = 14.4 (cm) Tissue and other material Viable, Non-Viable, Slough, Subcutaneous, Slough debrided: Level: Skin/Subcutaneous Tissue Debridement Description: Excisional Instrument: Curette Bleeding: Minimum Hemostasis Achieved: Pressure End Time: 14:49 Response to Treatment: Procedure was tolerated well Level of Consciousness Awake and  Alert (Post-procedure): Post Debridement Measurements of Total Wound Length: (cm) 4.8 Width: (cm) 3 Depth: (cm) 0.2 Volume: (cm) 2.262 Character of Wound/Ulcer Post Debridement: Stable Severity of Tissue Post Debridement: Fat layer exposed Post Procedure Diagnosis Same as Pre-procedure Electronic Signature(s) Signed: 08/16/2018 3:24:13 PM By: Elliot Gurney, BSN, RN, CWS, Kim RN, BSN Signed: 08/18/2018 7:47:50 PM By: Lenda Kelp PA-C Entered By: Elliot Gurney, BSN, RN, CWS, Kim on 08/16/2018 14:48:13 Johnathan Scott ([id number]) -------------------------------------------------------------------------------- HPI Details Patient Name: Johnathan Scott Date of Service: 08/16/2018 2:00 PM Medical Record Number: [id number] Patient Account Number: [id number] Date of Birth/Sex: 10-03-1966 (52 y.o. M) Treating RN: Huel Coventry Primary Care Provider: Birdie Sons, ERIC Other Clinician: Referring Provider: Birdie Sons, ERIC Treating Provider/Extender: Linwood Dibbles, Miachel Nardelli Weeks in Treatment: 1 History of Present Illness HPI Description: 08/09/18 on evaluation today patient presents for initial evaluation and clinic as referral concerning an ulcer that he has on the left anterior lower [place] which has been present for five months. He states that he actually noticed this after having an "insect bite" while driving down the road. He states that he did not see what kind of insect it was but he felt it wrong up his leg and then have the bite. Since that time is been having issues with the fact that this area does not want to heal. He does have a history of asthma as well as anemia is most recent CBC revealed a hemoglobin of 11.1 with a hematocrit of 34.6. He does have a history of panic attacks as well intermittently. No fevers, chills, nausea, or vomiting noted at this time. Specifically the patient does not appear to have any infection in regard to the left lower extremity. He's not on the current  antibiotics. 08/16/18 upon evaluation today patient actually appears to be doing significantly better in regard to his wound. He has been tolerating the dressing changes that he was performing on his own over the last week. Subsequently we did not wrap his leg at that time although we discussed it. We weren't sure that he was gonna be  able to get his pants on top of the dressing. Nonetheless the patient fortunately came today with shorts on he states that he is willing to be wrapped if it will help the wound to heal faster. He is pleased with the fact that the wound already appears to be doing better it's also not hurting as badly. The last debridement was a little bit more uncomfortable for him unfortunately. Nonetheless he states he's willing to allow me to debride it again today if I do so carefully. Electronic Signature(s) Signed: 08/18/2018 7:47:50 PM By: Lenda Kelp PA-C Entered By: Lenda Kelp on 08/18/2018 19:46:33 Johnathan Scott ([id number]) -------------------------------------------------------------------------------- Physical Exam Details Patient Name: Johnathan Scott Date of Service: 08/16/2018 2:00 PM Medical Record Number: [id number] Patient Account Number: [id number] Date of Birth/Sex: 05-04-1966 (52 y.o. M) Treating RN: Huel Coventry Primary Care Provider: Birdie Sons, ERIC Other Clinician: Referring Provider: Birdie Sons, ERIC Treating Provider/Extender: STONE III, Cammi Consalvo Weeks in Treatment: 1 Constitutional Well-nourished and well-hydrated in no acute distress. Respiratory normal breathing without difficulty. Psychiatric this patient is able to make decisions and demonstrates good insight into disease process. Alert and Oriented x 3. pleasant and cooperative. Notes Patient's wound bed currently shows evidence of actually good granulation with just some Slough on the surface of the wound and places. At least some places are thicker than others. I did perform sharp  debridement to remove the slough and biofilm from the surface the wound. The patient tolerated this well today without complication and post debridement the wound bed appears to be doing significantly better which is great news. Electronic Signature(s) Signed: 08/18/2018 7:47:50 PM By: Lenda Kelp PA-C Entered By: Lenda Kelp on 08/18/2018 19:46:59 Johnathan Scott ([id number]) -------------------------------------------------------------------------------- Physician Orders Details Patient Name: Johnathan Scott Date of Service: 08/16/2018 2:00 PM Medical Record Number: [id number] Patient Account Number: [id number] Date of Birth/Sex: 12-Jun-1966 (52 y.o. M) Treating RN: Huel Coventry Primary Care Provider: Birdie Sons, ERIC Other Clinician: Referring Provider: Birdie Sons, ERIC Treating Provider/Extender: Linwood Dibbles, Emett Stapel Weeks in Treatment: 1 Verbal / Phone Orders: No Diagnosis Coding ICD-10 Coding Code Description I87.2 Venous insufficiency (chronic) (peripheral) L97.822 Non-pressure chronic ulcer of other part of left lower leg with fat layer exposed D50.9 Iron deficiency anemia, unspecified J45.998 Other asthma Wound Cleansing Wound #1 Left,Anterior Lower Leg o Clean wound with Normal Saline. Anesthetic (add to Medication List) Wound #1 Left,Anterior Lower Leg o Topical Lidocaine 4% cream applied to wound bed prior to debridement (In Clinic Only). Primary Wound Dressing Wound #1 Left,Anterior Lower Leg o Iodoflex Secondary Dressing Wound #1 Left,Anterior Lower Leg o ABD and Kerlix/Conform Dressing Change Frequency Wound #1 Left,Anterior Lower Leg o Change dressing every week o Other: - Thursday for wrap change Follow-up Appointments Wound #1 Left,Anterior Lower Leg o Return Appointment in 1 week. o Nurse Visit as needed - Thursday for re-wrap Edema Control Wound #1 Left,Anterior Lower Leg o 3 Layer Compression System - Left Lower Extremity o  Elevate legs to the level of the heart and pump ankles as often as possible Electronic Signature(s) [name], SCHNITKER ([id number]) Signed: 08/16/2018 3:24:13 PM By: Elliot Gurney, BSN, RN, CWS, Kim RN, BSN Signed: 08/18/2018 7:47:50 PM By: Lenda Kelp PA-C Entered By: Elliot Gurney, BSN, RN, CWS, Kim on 08/16/2018 14:53:18 Johnathan Scott ([id number]) -------------------------------------------------------------------------------- Problem List Details Patient Name: Johnathan Scott Date of Service: 08/16/2018 2:00 PM Medical Record Number: [id number] Patient Account Number: [id number] Date of Birth/Sex: 09/15/1966 (52 y.o. M) Treating RN: Huel Coventry Primary Care Provider: Birdie Sons, ERIC Other Clinician: Referring Provider:  SONNENBERG, ERIC Treating Provider/Extender: STONE III, Wandalene Abrams Weeks in Treatment: 1 Active Problems ICD-10 Evaluated Encounter Code Description Active Date Today Diagnosis I87.2 Venous insufficiency (chronic) (peripheral) 08/09/2018 No Yes L97.822 Non-pressure chronic ulcer of other part of left lower leg with 08/09/2018 No Yes fat layer exposed D50.9 Iron deficiency anemia, unspecified 08/09/2018 No Yes J45.998 Other asthma 08/09/2018 No Yes Inactive Problems Resolved Problems Electronic Signature(s) Signed: 08/18/2018 7:47:50 PM By: Lenda Kelp PA-C Entered By: Lenda Kelp on 08/16/2018 13:56:31 Johnathan Scott ([id number]) -------------------------------------------------------------------------------- Progress Note Details Patient Name: Johnathan Scott Date of Service: 08/16/2018 2:00 PM Medical Record Number: [id number] Patient Account Number: [id number] Date of Birth/Sex: [birth year] (52 y.o. M) Treating RN: Huel Coventry Primary Care Provider: Birdie Sons, ERIC Other Clinician: Referring Provider: Birdie Sons, ERIC Treating Provider/Extender: Linwood Dibbles, Ladarrion Telfair Weeks in Treatment: 1 Subjective Chief Complaint Information obtained from Patient Left Anterior LE  Ulcer History of Present Illness (HPI) 08/09/18 on evaluation today patient presents for initial evaluation and clinic as referral concerning an ulcer that he has on the left anterior lower [place] which has been present for five months. He states that he actually noticed this after having an "insect bite" while driving down the road. He states that he did not see what kind of insect it was but he felt it wrong up his leg and then have the bite. Since that time is been having issues with the fact that this area does not want to heal. He does have a history of asthma as well as anemia is most recent CBC revealed a hemoglobin of 11.1 with a hematocrit of 34.6. He does have a history of panic attacks as well intermittently. No fevers, chills, nausea, or vomiting noted at this time. Specifically the patient does not appear to have any infection in regard to the left lower extremity. He's not on the current antibiotics. 08/16/18 upon evaluation today patient actually appears to be doing significantly better in regard to his wound. He has been tolerating the dressing changes that he was performing on his own over the last week. Subsequently we did not wrap his leg at that time although we discussed it. We weren't sure that he was gonna be able to get his pants on top of the dressing. Nonetheless the patient fortunately came today with shorts on he states that he is willing to be wrapped if it will help the wound to heal faster. He is pleased with the fact that the wound already appears to be doing better it's also not hurting as badly. The last debridement was a little bit more uncomfortable for him unfortunately. Nonetheless he states he's willing to allow me to debride it again today if I do so carefully. Patient History Information obtained from Patient. Family History Cancer - Mother,Father,Siblings, Diabetes - Siblings, Heart Disease - Siblings, Hypertension - Siblings, No family history of  Hereditary Spherocytosis, Kidney Disease, Lung Disease, Seizures, Stroke, Thyroid Problems, Tuberculosis. Social History Former smoker - 7 years, Marital Status - Single, Alcohol Use - Rarely, Drug Use - No History, Caffeine Use - Never. Review of Systems (ROS) Constitutional Symptoms (General Health) Denies complaints or symptoms of Fever, Chills. Respiratory The patient has no complaints or symptoms. Cardiovascular The patient has no complaints or symptoms. Psychiatric The patient has no complaints or symptoms. KAELEN, CAUGHLIN ([id number]) Objective Constitutional Well-nourished and well-hydrated in no acute distress. Vitals Time Taken: 2:27 PM, Height: 61 in, Weight: 232 lbs, BMI: 43.8, Temperature: 98.4 F, Pulse: 60 bpm, Respiratory Rate: 16 breaths/min,  Blood Pressure: 173/80 mmHg. Respiratory normal breathing without difficulty. Psychiatric this patient is able to make decisions and demonstrates good insight into disease process. Alert and Oriented x 3. pleasant and cooperative. General Notes: Patient's wound bed currently shows evidence of actually good granulation with just some Slough on the surface of the wound and places. At least some places are thicker than others. I did perform sharp debridement to remove the slough and biofilm from the surface the wound. The patient tolerated this well today without complication and post debridement the wound bed appears to be doing significantly better which is great news. Integumentary (Hair, Skin) Wound #1 status is Open. Original cause of wound was Gradually Appeared. The wound is located on the Left,Anterior Lower Leg. The wound measures 4.8cm length x 3cm width x 0.1cm depth; 11.31cm^2 area and 1.131cm^3 volume. There is Fat Layer (Subcutaneous Tissue) Exposed exposed. There is no tunneling or undermining noted. There is a medium amount of serosanguineous drainage noted. The wound margin is flat and intact. There is small  (1-33%) red granulation within the wound bed. There is a large (67-100%) amount of necrotic tissue within the wound bed including Adherent Slough. The periwound skin appearance exhibited: Scarring, Hemosiderin Staining, Erythema. The periwound skin appearance did not exhibit: Callus, Crepitus, Excoriation, Induration, Rash, Dry/Scaly, Maceration, Atrophie Blanche, Cyanosis, Ecchymosis, Mottled, Pallor, Rubor. The surrounding wound skin color is noted with erythema which is circumferential. Periwound temperature was noted as No Abnormality. The periwound has tenderness on palpation. Assessment Active Problems ICD-10 Venous insufficiency (chronic) (peripheral) Non-pressure chronic ulcer of other part of left lower leg with fat layer exposed Iron deficiency anemia, unspecified Other asthma Procedures Sann, Maisie Fus ([id number]) Wound #1 Pre-procedure diagnosis of Wound #1 is a Venous Leg Ulcer located on the Left,Anterior Lower Leg .Severity of Tissue Pre Debridement is: Fat layer exposed. There was a Excisional Skin/Subcutaneous Tissue Debridement with a total area of 14.4 sq cm performed by STONE III, Ridgely Anastacio E., PA-C. With the following instrument(s): Curette to remove Viable and Non-Viable tissue/material. Material removed includes Subcutaneous Tissue and Slough and after achieving pain control using Lidocaine. No specimens were taken. A time out was conducted at 14:47, prior to the start of the procedure. A Minimum amount of bleeding was controlled with Pressure. The procedure was tolerated well. Post Debridement Measurements: 4.8cm length x 3cm width x 0.2cm depth; 2.262cm^3 volume. Character of Wound/Ulcer Post Debridement is stable. Severity of Tissue Post Debridement is: Fat layer exposed. Post procedure Diagnosis Wound #1: Same as Pre-Procedure Plan Wound Cleansing: Wound #1 Left,Anterior Lower Leg: Clean wound with Normal Saline. Anesthetic (add to Medication List): Wound #1  Left,Anterior Lower Leg: Topical Lidocaine 4% cream applied to wound bed prior to debridement (In Clinic Only). Primary Wound Dressing: Wound #1 Left,Anterior Lower Leg: Iodoflex Secondary Dressing: Wound #1 Left,Anterior Lower Leg: ABD and Kerlix/Conform Dressing Change Frequency: Wound #1 Left,Anterior Lower Leg: Change dressing every week Other: - Thursday for wrap change Follow-up Appointments: Wound #1 Left,Anterior Lower Leg: Return Appointment in 1 week. Nurse Visit as needed - Thursday for re-wrap Edema Control: Wound #1 Left,Anterior Lower Leg: 3 Layer Compression System - Left Lower Extremity Elevate legs to the level of the heart and pump ankles as often as possible At this point I did suggest that we switch to the above wound care orders for the next week. This will mean that will likely need to bring the patient back for a nurse visit rapid change towards the end of this week  simply due to the fact that the wrap is going to start sliding down. He understands. Otherwise we would normally see him on a one week follow-up visit rotation. If anything changes in the meantime he will contact the office and let us know otherwise we gonna see him back for reevaluation in one weeks time. Please see above for specific wound care orders. We will see patient for re-evaluation in 1 week(s) here in the clinic. If anything worsens or changes patient will contact our office for additional recommendations. KHALIF, STENDER ([id number]) Electronic Signature(s) Signed: 08/18/2018 7:47:50 PM By: Lenda Kelp PA-C Entered By: Lenda Kelp on 08/18/2018 19:47:09 Johnathan Scott ([id number]) -------------------------------------------------------------------------------- ROS/PFSH Details Patient Name: Johnathan Scott Date of Service: 08/16/2018 2:00 PM Medical Record Number: [id number] Patient Account Number: [id number] Date of Birth/Sex: [birth year] (52 y.o. M) Treating RN: Huel Coventry Primary Care Provider: Birdie Sons, ERIC Other Clinician: Referring Provider: Birdie Sons, ERIC Treating Provider/Extender: Linwood Dibbles, Zamira Hickam Weeks in Treatment: 1 Information Obtained From Patient Wound History Do you currently have one or more open woundso Yes How many open wounds do you currently haveo 1 Approximately how long have you had your woundso 5 months How have you been treating your wound(s) until nowo open to air Has your wound(s) ever healed and then re-openedo No Have you had any lab work done in the past montho No Have you tested positive for an antibiotic resistant organism (MRSA, VRE)o No Have you tested positive for osteomyelitis (bone infection)o No Have you had any tests for circulation on your legso No Have you had other problems associated with your woundso Swelling Constitutional Symptoms (General Health) Complaints and Symptoms: Negative for: Fever; Chills Eyes Medical History: Negative for: Cataracts; Glaucoma; Optic Neuritis Ear/Nose/Mouth/Throat Medical History: Negative for: Chronic sinus problems/congestion; Middle ear problems Hematologic/Lymphatic Medical History: Negative for: Anemia; Hemophilia; Human Immunodeficiency Virus; Lymphedema; Sickle Cell Disease Respiratory Complaints and Symptoms: No Complaints or Symptoms Medical History: Positive for: Asthma - history as a child Negative for: Aspiration; Chronic Obstructive Pulmonary Disease (COPD); Pneumothorax; Sleep Apnea; Tuberculosis Cardiovascular Complaints and Symptoms: No Complaints or Symptoms Medical History: Negative for: Angina; Arrhythmia; Congestive Heart Failure; Coronary Artery Disease; Deep Vein Thrombosis; Alderfer, Tashi ([id number]) Hypertension; Hypotension; Myocardial Infarction; Peripheral Arterial Disease; Peripheral Venous Disease; Phlebitis; Vasculitis Gastrointestinal Medical History: Negative for: Cirrhosis ; Colitis; Crohnos; Hepatitis A; Hepatitis B;  Hepatitis C Endocrine Medical History: Negative for: Type I Diabetes; Type II Diabetes Genitourinary Medical History: Negative for: End Stage Renal Disease Immunological Medical History: Negative for: Lupus Erythematosus; Raynaudos; Scleroderma Integumentary (Skin) Medical History: Negative for: History of Burn; History of pressure wounds Musculoskeletal Medical History: Positive for: Osteoarthritis - both hips Negative for: Gout; Rheumatoid Arthritis; Osteomyelitis Neurologic Medical History: Positive for: Seizure Disorder - as a child Negative for: Dementia; Neuropathy; Quadriplegia; Paraplegia Oncologic Medical History: Negative for: Received Chemotherapy; Received Radiation Psychiatric Complaints and Symptoms: No Complaints or Symptoms Medical History: Negative for: Anorexia/bulimia; Confinement Anxiety Immunizations Pneumococcal Vaccine: Received Pneumococcal Vaccination: No Implantable Devices JABRE, HEO ([id number]) Family and Social History Cancer: Yes - Mother,Father,Siblings; Diabetes: Yes - Siblings; Heart Disease: Yes - Siblings; Hereditary Spherocytosis: No; Hypertension: Yes - Siblings; Kidney Disease: No; Lung Disease: No; Seizures: No; Stroke: No; Thyroid Problems: No; Tuberculosis: No; Former smoker - 7 years; Marital Status - Single; Alcohol Use: Rarely; Drug Use: No History; Caffeine Use: Never; Financial Concerns: No; Food, Clothing or Shelter Needs: No; Support System Lacking: No; Transportation Concerns: No; Advanced Directives: No; Patient does not want information on  Advanced Directives Physician Affirmation I have reviewed and agree with the above information. Electronic Signature(s) Signed: 08/18/2018 7:47:50 PM By: Lenda Kelp PA-C Signed: 08/19/2018 7:26:08 AM By: Elliot Gurney BSN, RN, CWS, Kim RN, BSN Entered By: Lenda Kelp on 08/18/2018 19:46:49 ARIAS, WEINERT  ([id number]) -------------------------------------------------------------------------------- SuperBill Details Patient Name: Johnathan Scott Date of Service: 08/16/2018 Medical Record Number: [id number] Patient Account Number: [id number] Date of Birth/Sex: 10/23/1965 (52 y.o. M) Treating RN: Huel Coventry Primary Care Provider: Birdie Sons, ERIC Other Clinician: Referring Provider: Birdie Sons, ERIC Treating Provider/Extender: Linwood Dibbles, Fama Muenchow Weeks in Treatment: 1 Diagnosis Coding ICD-10 Codes Code Description I87.2 Venous insufficiency (chronic) (peripheral) L97.822 Non-pressure chronic ulcer of other part of left lower leg with fat layer exposed D50.9 Iron deficiency anemia, unspecified J45.998 Other asthma Facility Procedures CPT4 Code Description: [id number] 11042 - DEB SUBQ TISSUE 20 SQ CM/< ICD-10 Diagnosis Description L97.822 Non-pressure chronic ulcer of other part of left lower leg with Modifier: fat layer expos Quantity: 1 ed Physician Procedures CPT4 Code Description: [id number] 11042 - WC PHYS SUBQ TISS 20 SQ CM ICD-10 Diagnosis Description L97.822 Non-pressure chronic ulcer of other part of left lower leg with Modifier: fat layer expos Quantity: 1 ed Electronic Signature(s) Signed: 08/18/2018 7:47:50 PM By: Lenda Kelp PA-C Entered By: Lenda Kelp on 08/17/2018 13:11:07

## 2018-08-21 NOTE — Progress Notes (Signed)
DHEERAJ, HAIL ([id number]) Visit Report for 08/16/2018 Arrival Information Details Patient Name: Johnathan Scott, Johnathan Scott Date of Service: 08/16/2018 2:00 PM Medical Record Number: [id number] Patient Account Number: [id number] Date of Birth/Sex: [birth year] (52 y.o. M) Treating RN: Rema Jasmine Primary Care Areanna Gengler: Birdie Sons, ERIC Other Clinician: Referring Li Fragoso: Birdie Sons, ERIC Treating Jamont Mellin/Extender: Linwood Dibbles, HOYT Weeks in Treatment: 1 Visit Information History Since Last Visit Added or deleted any medications: No Patient Arrived: Cane Any new allergies or adverse reactions: No Arrival Time: 14:20 Had a fall or experienced change in No Accompanied By: self activities of daily living that may affect Transfer Assistance: None risk of falls: Patient Identification Verified: Yes Signs or symptoms of abuse/neglect since last visito No Secondary Verification Process Completed: Yes Hospitalized since last visit: No Implantable device outside of the clinic excluding No cellular tissue based products placed in the center since last visit: Has Dressing in Place as Prescribed: Yes Pain Present Now: No Electronic Signature(s) Signed: 08/16/2018 3:39:15 PM By: Rema Jasmine Entered By: Rema Jasmine on 08/16/2018 14:24:17 Johnathan Scott ([id number]) -------------------------------------------------------------------------------- Lower Extremity Assessment Details Patient Name: Johnathan Scott Date of Service: 08/16/2018 2:00 PM Medical Record Number: [id number] Patient Account Number: [id number] Date of Birth/Sex: Jan 25, 1966 (52 y.o. M) Treating RN: Rema Jasmine Primary Care Irean Kendricks: Birdie Sons, ERIC Other Clinician: Referring Dyana Magner: Birdie Sons, ERIC Treating Brenden Rudman/Extender: Linwood Dibbles, HOYT Weeks in Treatment: 1 Edema Assessment Assessed: [Left: No] [Right: No] [Left: Edema] [Right: :] Calf Left: Right: Point of Measurement: 34 cm From Medial Instep cm cm Ankle Left:  Right: Point of Measurement: 10 cm From Medial Instep cm cm Vascular Assessment Claudication: Claudication Assessment [Left:None] Pulses: Dorsalis Pedis Palpable: [Left:Yes] Posterior Tibial Extremity colors, hair growth, and conditions: Extremity Color: [Left:Hyperpigmented] Hair Growth on Extremity: [Left:No] Temperature of Extremity: [Left:Warm] Capillary Refill: [Left:< 3 seconds] Toe Nail Assessment Left: Right: Thick: Yes Discolored: Yes Deformed: Yes Improper Length and Hygiene: Yes Electronic Signature(s) Signed: 08/16/2018 3:39:15 PM By: Rema Jasmine Entered By: Rema Jasmine on 08/16/2018 14:35:59 Johnathan Scott ([id number]) -------------------------------------------------------------------------------- Multi Wound Chart Details Patient Name: Johnathan Scott Date of Service: 08/16/2018 2:00 PM Medical Record Number: [id number] Patient Account Number: [id number] Date of Birth/Sex: 04-30-1966 (52 y.o. M) Treating RN: Huel Coventry Primary Care Pasqualina Colasurdo: Birdie Sons, ERIC Other Clinician: Referring Shatima Zalar: Birdie Sons, ERIC Treating Elyon Zoll/Extender: Linwood Dibbles, HOYT Weeks in Treatment: 1 Vital Signs Height(in): 61 Pulse(bpm): 60 Weight(lbs): 232 Blood Pressure(mmHg): 173/80 Body Mass Index(BMI): 44 Temperature(F): 98.4 Respiratory Rate 16 (breaths/min): Photos: [1:No Photos] [N/A:N/A] Wound Location: [1:Left Lower Leg - Anterior] [N/A:N/A] Wounding Event: [1:Gradually Appeared] [N/A:N/A] Primary Etiology: [1:Venous Leg Ulcer] [N/A:N/A] Comorbid History: [1:Asthma, Osteoarthritis, Seizure Disorder] [N/A:N/A] Date Acquired: [1:03/08/2018] [N/A:N/A] Weeks of Treatment: [1:1] [N/A:N/A] Wound Status: [1:Open] [N/A:N/A] Measurements L x W x D [1:4.8x3x0.1] [N/A:N/A] (cm) Area (cm) : [1:11.31] [N/A:N/A] Volume (cm) : [1:1.131] [N/A:N/A] % Reduction in Area: [1:-14.30%] [N/A:N/A] % Reduction in Volume: [1:42.80%] [N/A:N/A] Classification: [1:Full Thickness  Without Exposed Support Structures] [N/A:N/A] Exudate Amount: [1:Medium] [N/A:N/A] Exudate Type: [1:Serosanguineous] [N/A:N/A] Exudate Color: [1:red, brown] [N/A:N/A] Wound Margin: [1:Flat and Intact] [N/A:N/A] Granulation Amount: [1:Small (1-33%)] [N/A:N/A] Granulation Quality: [1:Red] [N/A:N/A] Necrotic Amount: [1:Large (67-100%)] [N/A:N/A] Exposed Structures: [1:Fat Layer (Subcutaneous Tissue) Exposed: Yes Fascia: No Tendon: No Muscle: No Joint: No Bone: No] [N/A:N/A] Epithelialization: [1:None] [N/A:N/A] Periwound Skin Texture: [1:Scarring: Yes Excoriation: No Induration: No Callus: No] [N/A:N/A] Crepitus: No Rash: No Periwound Skin Moisture: Maceration: No N/A N/A Dry/Scaly: No Periwound Skin Color: Erythema: Yes N/A N/A Hemosiderin Staining: Yes Atrophie Blanche: No Cyanosis: No Ecchymosis: No Mottled: No  Pallor: No Rubor: No Erythema Location: Circumferential N/A N/A Temperature: No Abnormality N/A N/A Tenderness on Palpation: Yes N/A N/A Wound Preparation: Ulcer Cleansing: N/A N/A Rinsed/Irrigated with Saline Topical Anesthetic Applied: Other: lidocaine 4% Treatment Notes Electronic Signature(s) Signed: 08/16/2018 3:24:13 PM By: Elliot Gurney, BSN, RN, CWS, Kim RN, BSN Entered By: Elliot Gurney, BSN, RN, CWS, Kim on 08/16/2018 14:44:09 Johnathan Scott ([id number]) -------------------------------------------------------------------------------- Multi-Disciplinary Care Plan Details Patient Name: Johnathan Scott Date of Service: 08/16/2018 2:00 PM Medical Record Number: [id number] Patient Account Number: [id number] Date of Birth/Sex: 06-08-1966 (52 y.o. M) Treating RN: Huel Coventry Primary Care Jasen Hartstein: Birdie Sons, ERIC Other Clinician: Referring Carliss Quast: Birdie Sons, ERIC Treating Endy Easterly/Extender: Linwood Dibbles, HOYT Weeks in Treatment: 1 Active Inactive ` Necrotic Tissue Nursing Diagnoses: Knowledge deficit related to management of necrotic/devitalized  tissue Goals: Necrotic/devitalized tissue will be minimized in the wound bed Date Initiated: 08/09/2018 Target Resolution Date: 09/09/2018 Goal Status: Active Interventions: Assess patient pain level pre-, during and post procedure and prior to discharge Treatment Activities: Apply topical anesthetic as ordered : 08/09/2018 Excisional debridement : 08/09/2018 Notes: ` Orientation to the Wound Care Program Nursing Diagnoses: Knowledge deficit related to the wound healing center program Goals: Patient/caregiver will verbalize understanding of the Wound Healing Center Program Date Initiated: 08/09/2018 Target Resolution Date: 09/09/2018 Goal Status: Active Interventions: Provide education on orientation to the wound center Notes: ` Wound/Skin Impairment Nursing Diagnoses: Impaired tissue integrity Knowledge deficit related to smoking impact on wound healing Goals: Johnathan Scott ([id number]) Ulcer/skin breakdown will have a volume reduction of 30% by week 4 Date Initiated: 08/09/2018 Target Resolution Date: 09/09/2018 Goal Status: Active Interventions: Assess patient/caregiver ability to obtain necessary supplies Assess ulceration(s) every visit Treatment Activities: Topical wound management initiated : 08/09/2018 Notes: Electronic Signature(s) Signed: 08/16/2018 3:24:13 PM By: Elliot Gurney, BSN, RN, CWS, Kim RN, BSN Entered By: Elliot Gurney, BSN, RN, CWS, Kim on 08/16/2018 14:43:55 Johnathan Scott ([id number]) -------------------------------------------------------------------------------- Pain Assessment Details Patient Name: Johnathan Scott Date of Service: 08/16/2018 2:00 PM Medical Record Number: [id number] Patient Account Number: [id number] Date of Birth/Sex: 17-Mar-1966 (52 y.o. M) Treating RN: Rema Jasmine Primary Care Nupur Hohman: Birdie Sons ERIC Other Clinician: Referring Cane Dubray: Birdie Sons, ERIC Treating Nyliah Nierenberg/Extender: Linwood Dibbles, HOYT Weeks in Treatment: 1 Active  Problems Location of Pain Severity and Description of Pain Patient Has Paino No Site Locations Pain Management and Medication Current Pain Management: Goals for Pain Management pt c/o burning to wound enc to see primary as needed for pain. Electronic Signature(s) Signed: 08/16/2018 3:39:15 PM By: Rema Jasmine Entered By: Rema Jasmine on 08/16/2018 14:25:31 Johnathan Scott ([id number]) -------------------------------------------------------------------------------- Patient/Caregiver Education Details Patient Name: Johnathan Scott Date of Service: 08/16/2018 2:00 PM Medical Record Number: [id number] Patient Account Number: [id number] Date of Birth/Gender: Oct 02, 1966 (52 y.o. M) Treating RN: Huel Coventry Primary Care Physician: Birdie Sons, ERIC Other Clinician: Referring Physician: Birdie Sons, ERIC Treating Physician/Extender: Skeet Simmer in Treatment: 1 Education Assessment Education Provided To: Patient Education Topics Provided Venous: Handouts: Controlling Swelling with Multilayered Compression Wraps Methods: Demonstration, Explain/Verbal Responses: State content correctly Wound/Skin Impairment: Handouts: Caring for Your Ulcer Methods: Demonstration, Explain/Verbal Responses: State content correctly Electronic Signature(s) Signed: 08/16/2018 3:24:13 PM By: Elliot Gurney, BSN, RN, CWS, Kim RN, BSN Entered By: Elliot Gurney, BSN, RN, CWS, Kim on 08/16/2018 14:48:45 Johnathan Scott ([id number]) -------------------------------------------------------------------------------- Wound Assessment Details Patient Name: Johnathan Scott Date of Service: 08/16/2018 2:00 PM Medical Record Number: [id number] Patient Account Number: [id number] Date of Birth/Sex: 09-Jun-1966 (52 y.o. M) Treating RN: Rema Jasmine Primary Care Tremel Setters: Birdie Sons ERIC Other Clinician: Referring Idil Maslanka: Birdie Sons, ERIC Treating Venkat Ankney/Extender: Larina Bras  III, HOYT Weeks in Treatment: 1 Wound Status Wound Number:  1 Primary Etiology: Venous Leg Ulcer Wound Location: Left Lower Leg - Anterior Wound Status: Open Wounding Event: Gradually Appeared Comorbid History: Asthma, Osteoarthritis, Seizure Disorder Date Acquired: 03/08/2018 Weeks Of Treatment: 1 Clustered Wound: No Wound Measurements Length: (cm) 4.8 Width: (cm) 3 Depth: (cm) 0.1 Area: (cm) 11.31 Volume: (cm) 1.131 % Reduction in Area: -14.3% % Reduction in Volume: 42.8% Epithelialization: None Tunneling: No Undermining: No Wound Description Full Thickness Without Exposed Support Foul O Classification: Structures Slough Wound Margin: Flat and Intact Exudate Medium Amount: Exudate Type: Serosanguineous Exudate Color: red, brown dor After Cleansing: No /Fibrino Yes Wound Bed Granulation Amount: Small (1-33%) Exposed Structure Granulation Quality: Red Fascia Exposed: No Necrotic Amount: Large (67-100%) Fat Layer (Subcutaneous Tissue) Exposed: Yes Necrotic Quality: Adherent Slough Tendon Exposed: No Muscle Exposed: No Joint Exposed: No Bone Exposed: No Periwound Skin Texture Texture Color No Abnormalities Noted: No No Abnormalities Noted: No Callus: No Atrophie Blanche: No Crepitus: No Cyanosis: No Excoriation: No Ecchymosis: No Induration: No Erythema: Yes Rash: No Erythema Location: Circumferential Scarring: Yes Hemosiderin Staining: Yes Mottled: No Moisture Pallor: No No Abnormalities Noted: No Rubor: No Dry / Scaly: No Paprocki, Johnathan ([id number]) Maceration: No Temperature / Pain Temperature: No Abnormality Tenderness on Palpation: Yes Wound Preparation Ulcer Cleansing: Rinsed/Irrigated with Saline Topical Anesthetic Applied: Other: lidocaine 4%, Electronic Signature(s) Signed: 08/16/2018 3:39:15 PM By: Rema Jasmine Entered By: Rema Jasmine on 08/16/2018 14:34:13 Johnathan Scott ([id number]) -------------------------------------------------------------------------------- Vitals Details Patient  Name: Johnathan Scott Date of Service: 08/16/2018 2:00 PM Medical Record Number: [id number] Patient Account Number: [id number] Date of Birth/Sex: 05-03-1966 (53 y.o. M) Treating RN: Rema Jasmine Primary Care Darean Rote: Birdie Sons, ERIC Other Clinician: Referring Wynetta Seith: Birdie Sons, ERIC Treating Kashish Yglesias/Extender: Linwood Dibbles, HOYT Weeks in Treatment: 1 Vital Signs Time Taken: 14:27 Temperature (F): 98.4 Height (in): 61 Pulse (bpm): 60 Weight (lbs): 232 Respiratory Rate (breaths/min): 16 Body Mass Index (BMI): 43.8 Blood Pressure (mmHg): 173/80 Reference Range: 80 - 120 mg / dl Electronic Signature(s) Signed: 08/16/2018 3:39:15 PM By: Rema Jasmine Entered By: Rema Jasmine on 08/16/2018 14:27:56

## 2018-08-24 ENCOUNTER — Encounter: Payer: 59 | Attending: Physician Assistant | Admitting: Physician Assistant

## 2018-08-24 DIAGNOSIS — Z8249 Family history of ischemic heart disease and other diseases of the circulatory system: Secondary | ICD-10-CM | POA: Diagnosis not present

## 2018-08-24 DIAGNOSIS — I872 Venous insufficiency (chronic) (peripheral): Secondary | ICD-10-CM | POA: Diagnosis not present

## 2018-08-24 DIAGNOSIS — L97922 Non-pressure chronic ulcer of unspecified part of left lower leg with fat layer exposed: Secondary | ICD-10-CM | POA: Insufficient documentation

## 2018-08-24 DIAGNOSIS — J45909 Unspecified asthma, uncomplicated: Secondary | ICD-10-CM | POA: Insufficient documentation

## 2018-08-24 DIAGNOSIS — Z87891 Personal history of nicotine dependence: Secondary | ICD-10-CM | POA: Diagnosis not present

## 2018-08-24 DIAGNOSIS — L97822 Non-pressure chronic ulcer of other part of left lower leg with fat layer exposed: Secondary | ICD-10-CM | POA: Diagnosis not present

## 2018-08-25 DIAGNOSIS — I87312 Chronic venous hypertension (idiopathic) with ulcer of left lower extremity: Secondary | ICD-10-CM | POA: Diagnosis not present

## 2018-08-29 NOTE — Progress Notes (Signed)
RAJESH, WYSS ([id number]) Visit Report for 08/24/2018 Arrival Information Details Patient Name: Johnathan Scott, Johnathan Scott Date of Service: 08/24/2018 3:45 PM Medical Record Number: [id number] Patient Account Number: [id number] Date of Birth/Sex: 06/16/1966 (52 y.o. M) Treating RN: Curtis Sites Primary Care Seba Madole: Birdie Sons, ERIC Other Clinician: Referring Kashus Karlen: Birdie Sons, ERIC Treating Timoty Bourke/Extender: Linwood Dibbles, HOYT Weeks in Treatment: 2 Visit Information History Since Last Visit Added or deleted any medications: No Patient Arrived: Cane Any new allergies or adverse reactions: No Arrival Time: 15:30 Had a fall or experienced change in No Accompanied By: self activities of daily living that may affect Transfer Assistance: None risk of falls: Patient Identification Verified: Yes Signs or symptoms of abuse/neglect since last visito No Secondary Verification Process Yes Hospitalized since last visit: No Completed: Implantable device outside of the clinic excluding No Patient Has Alerts: Yes cellular tissue based products placed in the center Patient Alerts: PT REFUSED FACE since last visit: PHOTO Has Dressing in Place as Prescribed: Yes Pain Present Now: No Electronic Signature(s) Signed: 08/26/2018 10:02:59 AM By: Elliot Gurney, BSN, RN, CWS, Kim RN, BSN Previous Signature: 08/24/2018 3:51:55 PM Version By: Weyman Rodney, Sallie RCP, RRT, CHT Entered By: Elliot Gurney, BSN, RN, CWS, Kim on 08/24/2018 15:51:49 Johnathan Scott ([id number]) -------------------------------------------------------------------------------- Encounter Discharge Information Details Patient Name: Johnathan Scott Date of Service: 08/24/2018 3:45 PM Medical Record Number: [id number] Patient Account Number: [id number] Date of Birth/Sex: 10-10-1966 (52 y.o. M) Treating RN: Curtis Sites Primary Care Keelia Graybill: Birdie Sons, ERIC Other Clinician: Referring Layaan Mott: Birdie Sons, ERIC Treating Sanaai Doane/Extender:  Linwood Dibbles, HOYT Weeks in Treatment: 2 Encounter Discharge Information Items Post Procedure Vitals Discharge Condition: Stable Temperature (F): 98.1 Ambulatory Status: Cane Pulse (bpm): 68 Discharge Destination: Home Respiratory Rate (breaths/min): 18 Transportation: Private Auto Blood Pressure (mmHg): 151/74 Accompanied By: self Schedule Follow-up Appointment: Yes Clinical Summary of Care: Electronic Signature(s) Signed: 08/24/2018 4:36:26 PM By: Curtis Sites Entered By: Curtis Sites on 08/24/2018 16:36:26 Johnathan Scott ([id number]) -------------------------------------------------------------------------------- Lower Extremity Assessment Details Patient Name: Johnathan Scott Date of Service: 08/24/2018 3:45 PM Medical Record Number: [id number] Patient Account Number: [id number] Date of Birth/Sex: 1965/11/25 (52 y.o. M) Treating RN: Huel Coventry Primary Care Tujuana Kilmartin: Birdie Sons, ERIC Other Clinician: Referring Dougles Kimmey: Birdie Sons, ERIC Treating Ramia Sidney/Extender: Linwood Dibbles, HOYT Weeks in Treatment: 2 Edema Assessment Assessed: [Left: No] [Right: No] [Left: Edema] [Right: :] Calf Left: Right: Point of Measurement: 34 cm From Medial Instep 52.5 cm cm Ankle Left: Right: Point of Measurement: 10 cm From Medial Instep 26 cm cm Vascular Assessment Pulses: Dorsalis Pedis Palpable: [Left:Yes] Posterior Tibial Extremity colors, hair growth, and conditions: Extremity Color: [Left:Normal] Hair Growth on Extremity: [Left:No] Temperature of Extremity: [Left:Cool] Capillary Refill: [Left:< 3 seconds] Toe Nail Assessment Left: Right: Thick: Yes Discolored: Yes Deformed: Yes Improper Length and Hygiene: Yes Electronic Signature(s) Signed: 08/26/2018 10:02:59 AM By: Elliot Gurney, BSN, RN, CWS, Kim RN, BSN Entered By: Elliot Gurney, BSN, RN, CWS, Kim on 08/24/2018 15:51:20 Johnathan Scott ([id number]) -------------------------------------------------------------------------------- Multi  Wound Chart Details Patient Name: Johnathan Scott Date of Service: 08/24/2018 3:45 PM Medical Record Number: [id number] Patient Account Number: [id number] Date of Birth/Sex: 03/05/1966 (52 y.o. M) Treating RN: Curtis Sites Primary Care Silus Lanzo: Birdie Sons, ERIC Other Clinician: Referring Tuvia Woodrick: Birdie Sons, ERIC Treating Lakyn Mantione/Extender: Linwood Dibbles, HOYT Weeks in Treatment: 2 Vital Signs Height(in): 61 Pulse(bpm): 68 Weight(lbs): 232 Blood Pressure(mmHg): 151/74 Body Mass Index(BMI): 44 Temperature(F): 98.1 Respiratory Rate 16 (breaths/min): Photos: [1:No Photos] [N/A:N/A] Wound Location: [1:Left Lower Leg - Anterior] [N/A:N/A] Wounding Event: [1:Gradually Appeared] [N/A:N/A] Primary Etiology: [1:Venous Leg Ulcer] [N/A:N/A] Comorbid History: [1:Asthma, Osteoarthritis,  Seizure Disorder] [N/A:N/A] Date Acquired: [1:03/08/2018] [N/A:N/A] Weeks of Treatment: [1:2] [N/A:N/A] Wound Status: [1:Open] [N/A:N/A] Measurements L x W x D [1:4.6x2.6x0.1] [N/A:N/A] (cm) Area (cm) : [1:9.393] [N/A:N/A] Volume (cm) : [1:0.939] [N/A:N/A] % Reduction in Area: [1:5.10%] [N/A:N/A] % Reduction in Volume: [1:52.60%] [N/A:N/A] Classification: [1:Full Thickness Without Exposed Support Structures] [N/A:N/A] Exudate Amount: [1:Large] [N/A:N/A] Exudate Type: [1:Serosanguineous] [N/A:N/A] Exudate Color: [1:red, brown] [N/A:N/A] Wound Margin: [1:Flat and Intact] [N/A:N/A] Granulation Amount: [1:Medium (34-66%)] [N/A:N/A] Granulation Quality: [1:Red, Hyper-granulation] [N/A:N/A] Necrotic Amount: [1:Medium (34-66%)] [N/A:N/A] Exposed Structures: [1:Fat Layer (Subcutaneous Tissue) Exposed: Yes Fascia: No Tendon: No Muscle: No Joint: No Bone: No] [N/A:N/A] Periwound Skin Texture: [1:Excoriation: No Induration: No Callus: No Crepitus: No Rash: No Scarring: No] [N/A:N/A] Periwound Skin Moisture: Maceration: No N/A N/A Dry/Scaly: No Periwound Skin Color: Hemosiderin Staining: Yes N/A  N/A Rubor: Yes Atrophie Blanche: No Cyanosis: No Ecchymosis: No Erythema: No Mottled: No Pallor: No Tenderness on Palpation: No N/A N/A Wound Preparation: Ulcer Cleansing: Other: soap N/A N/A and water Topical Anesthetic Applied: Other: lidocaine 4% Treatment Notes Electronic Signature(s) Signed: 08/24/2018 5:21:58 PM By: Curtis Sites Entered By: Curtis Sites on 08/24/2018 16:25:07 Johnathan Scott ([id number]) -------------------------------------------------------------------------------- Multi-Disciplinary Care Plan Details Patient Name: Johnathan Scott Date of Service: 08/24/2018 3:45 PM Medical Record Number: [id number] Patient Account Number: [id number] Date of Birth/Sex: [birth year] (52 y.o. M) Treating RN: Curtis Sites Primary Care Kaylan Friedmann: Birdie Sons, ERIC Other Clinician: Referring Jasani Dolney: Birdie Sons, ERIC Treating Jessica Seidman/Extender: Linwood Dibbles, HOYT Weeks in Treatment: 2 Active Inactive ` Necrotic Tissue Nursing Diagnoses: Knowledge deficit related to management of necrotic/devitalized tissue Goals: Necrotic/devitalized tissue will be minimized in the wound bed Date Initiated: 08/09/2018 Target Resolution Date: 09/09/2018 Goal Status: Active Interventions: Assess patient pain level pre-, during and post procedure and prior to discharge Treatment Activities: Apply topical anesthetic as ordered : 08/09/2018 Excisional debridement : 08/09/2018 Notes: ` Orientation to the Wound Care Program Nursing Diagnoses: Knowledge deficit related to the wound healing center program Goals: Patient/caregiver will verbalize understanding of the Wound Healing Center Program Date Initiated: 08/09/2018 Target Resolution Date: 09/09/2018 Goal Status: Active Interventions: Provide education on orientation to the wound center Notes: ` Wound/Skin Impairment Nursing Diagnoses: Impaired tissue integrity Knowledge deficit related to smoking impact on wound  healing GoalsMILOS, Johnathan Scott ([id number]) Ulcer/skin breakdown will have a volume reduction of 30% by week 4 Date Initiated: 08/09/2018 Target Resolution Date: 09/09/2018 Goal Status: Active Interventions: Assess patient/caregiver ability to obtain necessary supplies Assess ulceration(s) every visit Treatment Activities: Topical wound management initiated : 08/09/2018 Notes: Electronic Signature(s) Signed: 08/24/2018 5:21:58 PM By: Curtis Sites Entered By: Curtis Sites on 08/24/2018 16:24:12 Johnathan Scott ([id number]) -------------------------------------------------------------------------------- Pain Assessment Details Patient Name: Johnathan Scott Date of Service: 08/24/2018 3:45 PM Medical Record Number: [id number] Patient Account Number: [id number] Date of Birth/Sex: Apr 26, 1966 (52 y.o. M) Treating RN: Curtis Sites Primary Care Kerry Odonohue: Birdie Sons, ERIC Other Clinician: Referring Autry Droege: Birdie Sons, ERIC Treating Tyauna Lacaze/Extender: Linwood Dibbles, HOYT Weeks in Treatment: 2 Active Problems Location of Pain Severity and Description of Pain Patient Has Paino No Site Locations Pain Management and Medication Current Pain Management: Electronic Signature(s) Signed: 08/24/2018 3:51:55 PM By: Dayton Martes RCP, RRT, CHT Signed: 08/24/2018 5:21:58 PM By: Curtis Sites Entered By: Dayton Martes on 08/24/2018 15:33:08 Johnathan Scott ([id number]) -------------------------------------------------------------------------------- Patient/Caregiver Education Details Patient Name: Johnathan Scott Date of Service: 08/24/2018 3:45 PM Medical Record Number: [id number] Patient Account Number: [id number] Date of Birth/Gender: 1966-09-07 (52 y.o. M) Treating RN: Curtis Sites Primary Care Physician: Birdie Sons, ERIC Other Clinician: Referring Physician: Birdie Sons  ERIC Treating Physician/Extender: Lenda Kelp Weeks in Treatment: 2 Education  Assessment Education Provided To: Patient Education Topics Provided Venous: Handouts: Other: need for ongoing compression Methods: Explain/Verbal Responses: State content correctly Electronic Signature(s) Signed: 08/24/2018 5:21:58 PM By: Curtis Sites Entered By: Curtis Sites on 08/24/2018 16:35:02 Johnathan Scott ([id number]) -------------------------------------------------------------------------------- Wound Assessment Details Patient Name: Johnathan Scott Date of Service: 08/24/2018 3:45 PM Medical Record Number: [id number] Patient Account Number: [id number] Date of Birth/Sex: 11/09/1965 (52 y.o. M) Treating RN: Huel Coventry Primary Care Chailyn Racette: Birdie Sons, ERIC Other Clinician: Referring Adonijah Baena: Birdie Sons, ERIC Treating Peityn Payton/Extender: Linwood Dibbles, HOYT Weeks in Treatment: 2 Wound Status Wound Number: 1 Primary Etiology: Venous Leg Ulcer Wound Location: Left Lower Leg - Anterior Wound Status: Open Wounding Event: Gradually Appeared Comorbid History: Asthma, Osteoarthritis, Seizure Disorder Date Acquired: 03/08/2018 Weeks Of Treatment: 2 Clustered Wound: No Photos Photo Uploaded By: Elliot Gurney, BSN, RN, CWS, Kim on 08/25/2018 07:57:09 Wound Measurements Length: (cm) 4.6 Width: (cm) 2.6 Depth: (cm) 0.1 Area: (cm) 9.393 Volume: (cm) 0.939 % Reduction in Area: 5.1% % Reduction in Volume: 52.6% Tunneling: No Undermining: No Wound Description Full Thickness Without Exposed Support Foul Odo Classification: Structures Slough/F Wound Margin: Flat and Intact Exudate Large Amount: Exudate Type: Serosanguineous Exudate Color: red, brown r After Cleansing: No ibrino Yes Wound Bed Granulation Amount: Medium (34-66%) Exposed Structure Granulation Quality: Red, Hyper-granulation Fascia Exposed: No Necrotic Amount: Medium (34-66%) Fat Layer (Subcutaneous Tissue) Exposed: Yes Necrotic Quality: Adherent Slough Tendon Exposed: No Muscle Exposed: No Joint  Exposed: No Bone Exposed: No Johnathan Scott, Johnathan Scott ([id number]) Periwound Skin Texture Texture Color No Abnormalities Noted: No No Abnormalities Noted: No Callus: No Atrophie Blanche: No Crepitus: No Cyanosis: No Excoriation: No Ecchymosis: No Induration: No Erythema: No Rash: No Hemosiderin Staining: Yes Scarring: No Mottled: No Pallor: No Moisture Rubor: Yes No Abnormalities Noted: No Dry / Scaly: No Maceration: No Wound Preparation Ulcer Cleansing: Other: soap and water, Topical Anesthetic Applied: Other: lidocaine 4%, Treatment Notes Wound #1 (Left, Anterior Lower Leg) 1. Cleansed with: Cleanse wound with antibacterial soap and water 2. Anesthetic Topical Lidocaine 4% cream to wound bed prior to debridement 3. Peri-wound Care: Moisturizing lotion 4. Dressing Applied: Iodoflex 5. Secondary Dressing Applied ABD Pad 7. Secured with 3 Layer Compression System - Left Lower Extremity Electronic Signature(s) Signed: 08/26/2018 10:02:59 AM By: Elliot Gurney, BSN, RN, CWS, Kim RN, BSN Entered By: Elliot Gurney, BSN, RN, CWS, Kim on 08/24/2018 15:48:24 Johnathan Scott ([id number]) -------------------------------------------------------------------------------- Vitals Details Patient Name: Johnathan Scott Date of Service: 08/24/2018 3:45 PM Medical Record Number: [id number] Patient Account Number: [id number] Date of Birth/Sex: [birth year] (52 y.o. M) Treating RN: Curtis Sites Primary Care Jerrye Seebeck: Birdie Sons, ERIC Other Clinician: Referring Asbury Hair: Birdie Sons, ERIC Treating Louisa Favaro/Extender: Linwood Dibbles, HOYT Weeks in Treatment: 2 Vital Signs Time Taken: 15:32 Temperature (F): 98.1 Height (in): 61 Pulse (bpm): 68 Weight (lbs): 232 Respiratory Rate (breaths/min): 16 Body Mass Index (BMI): 43.8 Blood Pressure (mmHg): 151/74 Reference Range: 80 - 120 mg / dl Electronic Signature(s) Signed: 08/24/2018 3:51:55 PM By: Dayton Martes RCP, RRT, CHT Entered By:  Dayton Martes on 08/24/2018 15:35:51

## 2018-08-30 NOTE — Progress Notes (Signed)
Johnathan Scott, Johnathan Scott ([id number]) Visit Report for 08/24/2018 Chief Complaint Document Details Patient Name: Johnathan Scott, Johnathan Scott Date of Service: 08/24/2018 3:45 PM Medical Record Number: [id number] Patient Account Number: [id number] Date of Birth/Sex: 1966/02/07 (52 y.o. M) Treating RN: Curtis Sites Primary Care Provider: Birdie Sons, ERIC Other Clinician: Referring Provider: Birdie Sons, ERIC Treating Provider/Extender: Linwood Dibbles, HOYT Weeks in Treatment: 2 Information Obtained from: Patient Chief Complaint Left Anterior LE Ulcer Electronic Signature(s) Signed: 08/27/2018 1:32:49 PM By: Lenda Kelp PA-C Entered By: Lenda Kelp on 08/24/2018 15:53:36 Johnathan Scott ([id number]) -------------------------------------------------------------------------------- Debridement Details Patient Name: Johnathan Scott Date of Service: 08/24/2018 3:45 PM Medical Record Number: [id number] Patient Account Number: [id number] Date of Birth/Sex: 1966/10/18 (52 y.o. M) Treating RN: Curtis Sites Primary Care Provider: Birdie Sons, ERIC Other Clinician: Referring Provider: Birdie Sons, ERIC Treating Provider/Extender: Linwood Dibbles, HOYT Weeks in Treatment: 2 Debridement Performed for Wound #1 Left,Anterior Lower Leg Assessment: Performed By: Physician STONE III, HOYT E., PA-C Debridement Type: Debridement Severity of Tissue Pre Fat layer exposed Debridement: Level of Consciousness (Pre- Awake and Alert procedure): Pre-procedure Verification/Time Yes - 16:25 Out Taken: Start Time: 16:25 Pain Control: Lidocaine 4% Topical Solution Total Area Debrided (L x W): 4.6 (cm) x 2.6 (cm) = 11.96 (cm) Tissue and other material Viable, Non-Viable, Slough, Subcutaneous, Slough debrided: Level: Skin/Subcutaneous Tissue Debridement Description: Excisional Instrument: Curette Bleeding: Minimum Hemostasis Achieved: Pressure End Time: 16:28 Procedural Pain: 0 Post Procedural Pain: 0 Response to  Treatment: Procedure was tolerated well Level of Consciousness Awake and Alert (Post-procedure): Post Debridement Measurements of Total Wound Length: (cm) 4.6 Width: (cm) 2.6 Depth: (cm) 0.2 Volume: (cm) 1.879 Character of Wound/Ulcer Post Debridement: Improved Severity of Tissue Post Debridement: Fat layer exposed Post Procedure Diagnosis Same as Pre-procedure Electronic Signature(s) Signed: 08/24/2018 5:21:58 PM By: Curtis Sites Signed: 08/27/2018 1:32:49 PM By: Lenda Kelp PA-C Entered By: Curtis Sites on 08/24/2018 16:28:25 Johnathan Scott ([id number]) -------------------------------------------------------------------------------- HPI Details Patient Name: Johnathan Scott Date of Service: 08/24/2018 3:45 PM Medical Record Number: [id number] Patient Account Number: [id number] Date of Birth/Sex: 08-07-1966 (52 y.o. M) Treating RN: Curtis Sites Primary Care Provider: Birdie Sons, ERIC Other Clinician: Referring Provider: Birdie Sons, ERIC Treating Provider/Extender: Linwood Dibbles, HOYT Weeks in Treatment: 2 History of Present Illness HPI Description: 08/09/18 on evaluation today patient presents for initial evaluation and clinic as referral concerning an ulcer that he has on the left anterior lower Trinity which has been present for five months. He states that he actually noticed this after having an "insect bite" while driving down the road. He states that he did not see what kind of insect it was but he felt it wrong up his leg and then have the bite. Since that time is been having issues with the fact that this area does not want to heal. He does have a history of asthma as well as anemia is most recent CBC revealed a hemoglobin of 11.1 with a hematocrit of 34.6. He does have a history of panic attacks as well intermittently. No fevers, chills, nausea, or vomiting noted at this time. Specifically the patient does not appear to have any infection in regard to the left lower  extremity. He's not on the current antibiotics. 08/16/18 upon evaluation today patient actually appears to be doing significantly better in regard to his wound. He has been tolerating the dressing changes that he was performing on his own over the last week. Subsequently we did not wrap his leg at that time although we discussed it. We weren't sure that he was  gonna be able to get his pants on top of the dressing. Nonetheless the patient fortunately came today with shorts on he states that he is willing to be wrapped if it will help the wound to heal faster. He is pleased with the fact that the wound already appears to be doing better it's also not hurting as badly. The last debridement was a little bit more uncomfortable for him unfortunately. Nonetheless he states he's willing to allow me to debride it again today if I do so carefully. 08/24/18 upon evaluation today patient actually appears to be doing rather well in regard to his lower Trinity ulcer on the left. He has been tolerating the dressing changes with Iodoflex including the wrap without complication and the wound [place] into making signs of good progress. Overall I'm very pleased with how things appear at this time. No fevers, chills, nausea, or vomiting noted at this time. Electronic Signature(s) Signed: 08/27/2018 1:32:49 PM By: Lenda Kelp PA-C Entered By: Lenda Kelp on 08/27/2018 13:31:56 Johnathan Scott ([id number]) -------------------------------------------------------------------------------- Physical Exam Details Patient Name: Johnathan Scott Date of Service: 08/24/2018 3:45 PM Medical Record Number: [id number] Patient Account Number: [id number] Date of Birth/Sex: May 14, 1966 (52 y.o. M) Treating RN: Curtis Sites Primary Care Provider: Birdie Sons, ERIC Other Clinician: Referring Provider: Birdie Sons, ERIC Treating Provider/Extender: STONE III, HOYT Weeks in Treatment: 2 Constitutional Well-nourished and  well-hydrated in no acute distress. Respiratory normal breathing without difficulty. Psychiatric this patient is able to make decisions and demonstrates good insight into disease process. Alert and Oriented x 3. pleasant and cooperative. Notes Upon evaluation at this time the patient seems to be making excellent progress as far as the wound is concerned. It did require sharp debridement today he did not have any significant discomfort except for the inferior portion and this was not terrible. Overall things seem to be going well and he is making great progress. Post debridement the wound bed appears to be greatly improved. Electronic Signature(s) Signed: 08/27/2018 1:32:49 PM By: Lenda Kelp PA-C Entered By: Lenda Kelp on 08/27/2018 13:32:20 Johnathan Scott ([id number]) -------------------------------------------------------------------------------- Physician Orders Details Patient Name: Johnathan Scott Date of Service: 08/24/2018 3:45 PM Medical Record Number: [id number] Patient Account Number: [id number] Date of Birth/Sex: [birth year] (52 y.o. M) Treating RN: Curtis Sites Primary Care Provider: Birdie Sons, ERIC Other Clinician: Referring Provider: Birdie Sons, ERIC Treating Provider/Extender: Linwood Dibbles, HOYT Weeks in Treatment: 2 Verbal / Phone Orders: No Diagnosis Coding ICD-10 Coding Code Description I87.2 Venous insufficiency (chronic) (peripheral) L97.822 Non-pressure chronic ulcer of other part of left lower leg with fat layer exposed D50.9 Iron deficiency anemia, unspecified J45.998 Other asthma Wound Cleansing Wound #1 Left,Anterior Lower Leg o Clean wound with Normal Saline. Anesthetic (add to Medication List) Wound #1 Left,Anterior Lower Leg o Topical Lidocaine 4% cream applied to wound bed prior to debridement (In Clinic Only). Primary Wound Dressing Wound #1 Left,Anterior Lower Leg o Iodoflex Secondary Dressing Wound #1 Left,Anterior Lower Leg o  ABD pad Dressing Change Frequency Wound #1 Left,Anterior Lower Leg o Change dressing every week o Other: - Thursday for wrap change Follow-up Appointments Wound #1 Left,Anterior Lower Leg o Return Appointment in 1 week. Edema Control Wound #1 Left,Anterior Lower Leg o 3 Layer Compression System - Left Lower Extremity o Elevate legs to the level of the heart and pump ankles as often as possible Electronic Signature(s) Signed: 08/24/2018 5:21:58 PM By: Leafy Kindle, Maisie Fus ([id number]) Signed: 08/27/2018 1:32:49 PM By: Lenda Kelp PA-C Entered By: Curtis Sites on  08/24/2018 16:30:09 Johnathan Scott, Johnathan Scott ([id number]) -------------------------------------------------------------------------------- Problem List Details Patient Name: Johnathan Scott, Johnathan Scott Date of Service: 08/24/2018 3:45 PM Medical Record Number: [id number] Patient Account Number: [id number] Date of Birth/Sex: 1966/01/30 (52 y.o. M) Treating RN: Curtis Sites Primary Care Provider: Birdie Sons, ERIC Other Clinician: Referring Provider: Birdie Sons, ERIC Treating Provider/Extender: Linwood Dibbles, HOYT Weeks in Treatment: 2 Active Problems ICD-10 Evaluated Encounter Code Description Active Date Today Diagnosis I87.2 Venous insufficiency (chronic) (peripheral) 08/09/2018 No Yes L97.822 Non-pressure chronic ulcer of other part of left lower leg with 08/09/2018 No Yes fat layer exposed D50.9 Iron deficiency anemia, unspecified 08/09/2018 No Yes J45.998 Other asthma 08/09/2018 No Yes Inactive Problems Resolved Problems Electronic Signature(s) Signed: 08/27/2018 1:32:49 PM By: Lenda Kelp PA-C Entered By: Lenda Kelp on 08/24/2018 15:53:28 Johnathan Scott ([id number]) -------------------------------------------------------------------------------- Progress Note Details Patient Name: Johnathan Scott Date of Service: 08/24/2018 3:45 PM Medical Record Number: [id number] Patient Account Number:  [id number] Date of Birth/Sex: 07/06/1966 (52 y.o. M) Treating RN: Curtis Sites Primary Care Provider: Birdie Sons, ERIC Other Clinician: Referring Provider: Birdie Sons, ERIC Treating Provider/Extender: Linwood Dibbles, HOYT Weeks in Treatment: 2 Subjective Chief Complaint Information obtained from Patient Left Anterior LE Ulcer History of Present Illness (HPI) 08/09/18 on evaluation today patient presents for initial evaluation and clinic as referral concerning an ulcer that he has on the left anterior lower [place] which has been present for five months. He states that he actually noticed this after having an "insect bite" while driving down the road. He states that he did not see what kind of insect it was but he felt it wrong up his leg and then have the bite. Since that time is been having issues with the fact that this area does not want to heal. He does have a history of asthma as well as anemia is most recent CBC revealed a hemoglobin of 11.1 with a hematocrit of 34.6. He does have a history of panic attacks as well intermittently. No fevers, chills, nausea, or vomiting noted at this time. Specifically the patient does not appear to have any infection in regard to the left lower extremity. He's not on the current antibiotics. 08/16/18 upon evaluation today patient actually appears to be doing significantly better in regard to his wound. He has been tolerating the dressing changes that he was performing on his own over the last week. Subsequently we did not wrap his leg at that time although we discussed it. We weren't sure that he was gonna be able to get his pants on top of the dressing. Nonetheless the patient fortunately came today with shorts on he states that he is willing to be wrapped if it will help the wound to heal faster. He is pleased with the fact that the wound already appears to be doing better it's also not hurting as badly. The last debridement was a little bit more  uncomfortable for him unfortunately. Nonetheless he states he's willing to allow me to debride it again today if I do so carefully. 08/24/18 upon evaluation today patient actually appears to be doing rather well in regard to his lower Trinity ulcer on the left. He has been tolerating the dressing changes with Iodoflex including the wrap without complication and the wound [place] into making signs of good progress. Overall I'm very pleased with how things appear at this time. No fevers, chills, nausea, or vomiting noted at this time. Patient History Information obtained from Patient. Family History Cancer - Mother,Father,Siblings, Diabetes - Siblings, Heart Disease - Siblings, Hypertension -  Siblings, No family history of Hereditary Spherocytosis, Kidney Disease, Lung Disease, Seizures, Stroke, Thyroid Problems, Tuberculosis. Social History Former smoker - 7 years, Marital Status - Single, Alcohol Use - Rarely, Drug Use - No History, Caffeine Use - Never. Review of Systems (ROS) Constitutional Symptoms (General Health) Denies complaints or symptoms of Fever, Chills. Respiratory The patient has no complaints or symptoms. Cardiovascular The patient has no complaints or symptoms. Psychiatric Johnathan Scott, Johnathan Scott ([id number]) The patient has no complaints or symptoms. Objective Constitutional Well-nourished and well-hydrated in no acute distress. Vitals Time Taken: 3:32 PM, Height: 61 in, Weight: 232 lbs, BMI: 43.8, Temperature: 98.1 F, Pulse: 68 bpm, Respiratory Rate: 16 breaths/min, Blood Pressure: 151/74 mmHg. Respiratory normal breathing without difficulty. Psychiatric this patient is able to make decisions and demonstrates good insight into disease process. Alert and Oriented x 3. pleasant and cooperative. General Notes: Upon evaluation at this time the patient seems to be making excellent progress as far as the wound is concerned. It did require sharp debridement today he did not have any  significant discomfort except for the inferior portion and this was not terrible. Overall things seem to be going well and he is making great progress. Post debridement the wound bed appears to be greatly improved. Integumentary (Hair, Skin) Wound #1 status is Open. Original cause of wound was Gradually Appeared. The wound is located on the Left,Anterior Lower Leg. The wound measures 4.6cm length x 2.6cm width x 0.1cm depth; 9.393cm^2 area and 0.939cm^3 volume. There is Fat Layer (Subcutaneous Tissue) Exposed exposed. There is no tunneling or undermining noted. There is a large amount of serosanguineous drainage noted. The wound margin is flat and intact. There is medium (34-66%) red, hyper - granulation within the wound bed. There is a medium (34-66%) amount of necrotic tissue within the wound bed including Adherent Slough. The periwound skin appearance exhibited: Hemosiderin Staining, Rubor. The periwound skin appearance did not exhibit: Callus, Crepitus, Excoriation, Induration, Rash, Scarring, Dry/Scaly, Maceration, Atrophie Blanche, Cyanosis, Ecchymosis, Mottled, Pallor, Erythema. Assessment Active Problems ICD-10 Venous insufficiency (chronic) (peripheral) Non-pressure chronic ulcer of other part of left lower leg with fat layer exposed Iron deficiency anemia, unspecified Other asthma Johnathan Scott, Johnathan Scott ([id number]) Procedures Wound #1 Pre-procedure diagnosis of Wound #1 is a Venous Leg Ulcer located on the Left,Anterior Lower Leg .Severity of Tissue Pre Debridement is: Fat layer exposed. There was a Excisional Skin/Subcutaneous Tissue Debridement with a total area of 11.96 sq cm performed by STONE III, HOYT E., PA-C. With the following instrument(s): Curette to remove Viable and Non-Viable tissue/material. Material removed includes Subcutaneous Tissue and Slough and after achieving pain control using Lidocaine 4% Topical Solution. No specimens were taken. A time out was conducted at  16:25, prior to the start of the procedure. A Minimum amount of bleeding was controlled with Pressure. The procedure was tolerated well with a pain level of 0 throughout and a pain level of 0 following the procedure. Post Debridement Measurements: 4.6cm length x 2.6cm width x 0.2cm depth; 1.879cm^3 volume. Character of Wound/Ulcer Post Debridement is improved. Severity of Tissue Post Debridement is: Fat layer exposed. Post procedure Diagnosis Wound #1: Same as Pre-Procedure Plan Wound Cleansing: Wound #1 Left,Anterior Lower Leg: Clean wound with Normal Saline. Anesthetic (add to Medication List): Wound #1 Left,Anterior Lower Leg: Topical Lidocaine 4% cream applied to wound bed prior to debridement (In Clinic Only). Primary Wound Dressing: Wound #1 Left,Anterior Lower Leg: Iodoflex Secondary Dressing: Wound #1 Left,Anterior Lower Leg: ABD pad Dressing Change Frequency: Wound #1 Left,Anterior  Lower Leg: Change dressing every week Other: - Thursday for wrap change Follow-up Appointments: Wound #1 Left,Anterior Lower Leg: Return Appointment in 1 week. Edema Control: Wound #1 Left,Anterior Lower Leg: 3 Layer Compression System - Left Lower Extremity Elevate legs to the level of the heart and pump ankles as often as possible At this point my suggestion is going to be that we go ahead and continue with the above wound care measures for the next week. The patient is in agreement the plan. If anything changes or worsens meantime he will contact the office and let me know. Otherwise will see were things stand at follow-up. Please see above for specific wound care orders. We will see patient for re-evaluation in 1 week(s) here in the clinic. If anything worsens or changes patient will contact our office for additional recommendations. Johnathan Scott, Johnathan Scott ([id number]) Electronic Signature(s) Signed: 08/27/2018 1:32:49 PM By: Lenda Kelp PA-C Entered By: Lenda Kelp on 08/27/2018  13:32:30 Johnathan Scott ([id number]) -------------------------------------------------------------------------------- ROS/PFSH Details Patient Name: Johnathan Scott Date of Service: 08/24/2018 3:45 PM Medical Record Number: [id number] Patient Account Number: [id number] Date of Birth/Sex: 12/15/1965 (52 y.o. M) Treating RN: Curtis Sites Primary Care Provider: Birdie Sons, ERIC Other Clinician: Referring Provider: Birdie Sons, ERIC Treating Provider/Extender: Linwood Dibbles, HOYT Weeks in Treatment: 2 Information Obtained From Patient Wound History Do you currently have one or more open woundso Yes How many open wounds do you currently haveo 1 Approximately how long have you had your woundso 5 months How have you been treating your wound(s) until nowo open to air Has your wound(s) ever healed and then re-openedo No Have you had any lab work done in the past montho No Have you tested positive for an antibiotic resistant organism (MRSA, VRE)o No Have you tested positive for osteomyelitis (bone infection)o No Have you had any tests for circulation on your legso No Have you had other problems associated with your woundso Swelling Constitutional Symptoms (General Health) Complaints and Symptoms: Negative for: Fever; Chills Eyes Medical History: Negative for: Cataracts; Glaucoma; Optic Neuritis Ear/Nose/Mouth/Throat Medical History: Negative for: Chronic sinus problems/congestion; Middle ear problems Hematologic/Lymphatic Medical History: Negative for: Anemia; Hemophilia; Human Immunodeficiency Virus; Lymphedema; Sickle Cell Disease Respiratory Complaints and Symptoms: No Complaints or Symptoms Medical History: Positive for: Asthma - history as a child Negative for: Aspiration; Chronic Obstructive Pulmonary Disease (COPD); Pneumothorax; Sleep Apnea; Tuberculosis Cardiovascular Complaints and Symptoms: No Complaints or Symptoms Medical History: Negative for: Angina; Arrhythmia;  Congestive Heart Failure; Coronary Artery Disease; Deep Vein Thrombosis; Novitski, Brodan ([id number]) Hypertension; Hypotension; Myocardial Infarction; Peripheral Arterial Disease; Peripheral Venous Disease; Phlebitis; Vasculitis Gastrointestinal Medical History: Negative for: Cirrhosis ; Colitis; Crohnos; Hepatitis A; Hepatitis B; Hepatitis C Endocrine Medical History: Negative for: Type I Diabetes; Type II Diabetes Genitourinary Medical History: Negative for: End Stage Renal Disease Immunological Medical History: Negative for: Lupus Erythematosus; Raynaudos; Scleroderma Integumentary (Skin) Medical History: Negative for: History of Burn; History of pressure wounds Musculoskeletal Medical History: Positive for: Osteoarthritis - both hips Negative for: Gout; Rheumatoid Arthritis; Osteomyelitis Neurologic Medical History: Positive for: Seizure Disorder - as a child Negative for: Dementia; Neuropathy; Quadriplegia; Paraplegia Oncologic Medical History: Negative for: Received Chemotherapy; Received Radiation Psychiatric Complaints and Symptoms: No Complaints or Symptoms Medical History: Negative for: Anorexia/bulimia; Confinement Anxiety Immunizations Pneumococcal Vaccine: Received Pneumococcal Vaccination: No Implantable Devices Johnathan Scott, Johnathan Scott ([id number]) Family and Social History Cancer: Yes - Mother,Father,Siblings; Diabetes: Yes - Siblings; Heart Disease: Yes - Siblings; Hereditary Spherocytosis: No; Hypertension: Yes - Siblings; Kidney Disease: No; Lung Disease: No;  Seizures: No; Stroke: No; Thyroid Problems: No; Tuberculosis: No; Former smoker - 7 years; Marital Status - Single; Alcohol Use: Rarely; Drug Use: No History; Caffeine Use: Never; Financial Concerns: No; Food, Clothing or Shelter Needs: No; Support System Lacking: No; Transportation Concerns: No; Advanced Directives: No; Patient does not want information on Advanced Directives Physician Affirmation I  have reviewed and agree with the above information. Electronic Signature(s) Signed: 08/27/2018 1:32:49 PM By: Lenda Kelp PA-C Signed: 08/27/2018 5:34:16 PM By: Curtis Sites Entered By: Lenda Kelp on 08/27/2018 13:32:11 Johnathan Scott ([id number]) -------------------------------------------------------------------------------- SuperBill Details Patient Name: Johnathan Scott Date of Service: 08/24/2018 Medical Record Number: [id number] Patient Account Number: [id number] Date of Birth/Sex: 07-Jul-1966 (52 y.o. M) Treating RN: Curtis Sites Primary Care Provider: Birdie Sons, ERIC Other Clinician: Referring Provider: Birdie Sons, ERIC Treating Provider/Extender: Linwood Dibbles, HOYT Weeks in Treatment: 2 Diagnosis Coding ICD-10 Codes Code Description I87.2 Venous insufficiency (chronic) (peripheral) L97.822 Non-pressure chronic ulcer of other part of left lower leg with fat layer exposed D50.9 Iron deficiency anemia, unspecified J45.998 Other asthma Facility Procedures CPT4 Code Description: [id number] 11042 - DEB SUBQ TISSUE 20 SQ CM/< ICD-10 Diagnosis Description L97.822 Non-pressure chronic ulcer of other part of left lower leg with Modifier: fat layer expos Quantity: 1 ed Physician Procedures CPT4 Code Description: [id number] 11042 - WC PHYS SUBQ TISS 20 SQ CM ICD-10 Diagnosis Description L97.822 Non-pressure chronic ulcer of other part of left lower leg with Modifier: fat layer expos Quantity: 1 ed Electronic Signature(s) Signed: 08/27/2018 1:32:49 PM By: Lenda Kelp PA-C Entered By: Lenda Kelp on 08/25/2018 07:36:36

## 2018-08-31 DIAGNOSIS — Z23 Encounter for immunization: Secondary | ICD-10-CM | POA: Diagnosis not present

## 2018-09-03 ENCOUNTER — Encounter: Payer: 59 | Admitting: Physician Assistant

## 2018-09-03 DIAGNOSIS — L97822 Non-pressure chronic ulcer of other part of left lower leg with fat layer exposed: Secondary | ICD-10-CM | POA: Diagnosis not present

## 2018-09-03 DIAGNOSIS — I872 Venous insufficiency (chronic) (peripheral): Secondary | ICD-10-CM | POA: Diagnosis not present

## 2018-09-03 DIAGNOSIS — L97922 Non-pressure chronic ulcer of unspecified part of left lower leg with fat layer exposed: Secondary | ICD-10-CM | POA: Diagnosis not present

## 2018-09-06 NOTE — Progress Notes (Signed)
Johnathan Scott, Johnathan Scott ([id number]) Visit Report for 09/03/2018 Arrival Information Details Patient Name: Johnathan Scott, Johnathan Scott Date of Service: 09/03/2018 3:30 PM Medical Record Number: [id number] Patient Account Number: [id number] Date of Birth/Sex: 05-21-1966 (52 y.o. M) Treating RN: Rema JasmineNg, Wendi Primary Care Zoiey Christy: Birdie SonsSONNENBERG, ERIC Other Clinician: Referring Suleima Ohlendorf: Birdie SonsSONNENBERG, ERIC Treating Leeona Mccardle/Extender: Linwood DibblesSTONE III, HOYT Weeks in Treatment: 3 Visit Information History Since Last Visit Added or deleted any medications: No Patient Arrived: Cane Any new allergies or adverse reactions: No Arrival Time: 15:42 Had a fall or experienced change in No Accompanied By: self activities of daily living that may affect Transfer Assistance: None risk of falls: Patient Identification Verified: Yes Signs or symptoms of abuse/neglect since last visito No Secondary Verification Process Yes Hospitalized since last visit: No Completed: Implantable device outside of the clinic excluding No Patient Has Alerts: Yes cellular tissue based products placed in the center Patient Alerts: PT REFUSED FACE since last visit: PHOTO Has Dressing in Place as Prescribed: Yes Pain Present Now: No Electronic Signature(s) Signed: 09/03/2018 4:46:57 PM By: Rema JasmineNg, Wendi Entered By: Rema JasmineNg, Wendi on 09/03/2018 15:43:41 Johnathan Scott, Johnathan Scott ([id number]) -------------------------------------------------------------------------------- Encounter Discharge Information Details Patient Name: Johnathan Scott, Johnathan Scott Date of Service: 09/03/2018 3:30 PM Medical Record Number: [id number] Patient Account Number: [id number] Date of Birth/Sex: 05-21-1966 (52 y.o. M) Treating RN: Huel CoventryWoody, Kim Primary Care Evanna Washinton: Birdie SonsSONNENBERG, ERIC Other Clinician: Referring Cambree Hendrix: Birdie SonsSONNENBERG, ERIC Treating Cheyenna Pankowski/Extender: Linwood DibblesSTONE III, HOYT Weeks in Treatment: 3 Encounter Discharge Information Items Post Procedure Vitals Discharge Condition: Stable Temperature  (F): 97.8 Ambulatory Status: Cane Pulse (bpm): 72 Discharge Destination: Home Respiratory Rate (breaths/min): 16 Transportation: Private Auto Blood Pressure (mmHg): 147/84 Accompanied By: self Schedule Follow-up Appointment: Yes Clinical Summary of Care: Electronic Signature(s) Signed: 09/03/2018 6:27:07 PM By: Elliot GurneyWoody, BSN, RN, CWS, Kim RN, BSN Entered By: Elliot GurneyWoody, BSN, RN, CWS, Kim on 09/03/2018 16:21:11 Johnathan Scott, Johnathan Scott ([id number]) -------------------------------------------------------------------------------- Lower Extremity Assessment Details Patient Name: Johnathan Scott, Johnathan Scott Date of Service: 09/03/2018 3:30 PM Medical Record Number: [id number] Patient Account Number: [id number] Date of Birth/Sex: 05-21-1966 (52 y.o. M) Treating RN: Rema JasmineNg, Wendi Primary Care Kendrick Haapala: Birdie SonsSONNENBERG, ERIC Other Clinician: Referring Jazalynn Mireles: Birdie SonsSONNENBERG, ERIC Treating Hunter Pinkard/Extender: Linwood DibblesSTONE III, HOYT Weeks in Treatment: 3 Edema Assessment Assessed: [Left: No] [Right: No] [Left: Edema] [Right: :] Calf Left: Right: Point of Measurement: 34 cm From Medial Instep 51.5 cm cm Ankle Left: Right: Point of Measurement: 10 cm From Medial Instep 25 cm cm Vascular Assessment Claudication: Claudication Assessment [Left:None] Pulses: Dorsalis Pedis Palpable: [Left:Yes] Posterior Tibial Extremity colors, hair growth, and conditions: Extremity Color: [Left:Hyperpigmented] Temperature of Extremity: [Left:Cool] Capillary Refill: [Left:< 3 seconds] Toe Nail Assessment Left: Right: Thick: Yes Discolored: Yes Deformed: Yes Improper Length and Hygiene: No Electronic Signature(s) Signed: 09/03/2018 4:46:57 PM By: Rema JasmineNg, Wendi Entered By: Rema JasmineNg, Wendi on 09/03/2018 15:58:24 Johnathan Scott, Johnathan Scott ([id number]) -------------------------------------------------------------------------------- Multi Wound Chart Details Patient Name: Johnathan Scott, Johnathan Scott Date of Service: 09/03/2018 3:30 PM Medical Record Number:  [id number] Patient Account Number: [id number] Date of Birth/Sex: 05-21-1966 (52 y.o. M) Treating RN: Huel CoventryWoody, Kim Primary Care Aashish Hamm: Birdie SonsSONNENBERG, ERIC Other Clinician: Referring Malala Trenkamp: Birdie SonsSONNENBERG, ERIC Treating Letita Prentiss/Extender: Linwood DibblesSTONE III, HOYT Weeks in Treatment: 3 Vital Signs Height(in): 61 Pulse(bpm): 72 Weight(lbs): 232 Blood Pressure(mmHg): 147/84 Body Mass Index(BMI): 44 Temperature(F): 97.8 Respiratory Rate 16 (breaths/min): Photos: [1:No Photos] [N/A:N/A] Wound Location: [1:Left Lower Leg - Anterior] [N/A:N/A] Wounding Event: [1:Gradually Appeared] [N/A:N/A] Primary Etiology: [1:Venous Leg Ulcer] [N/A:N/A] Comorbid History: [1:Asthma, Osteoarthritis, Seizure Disorder] [N/A:N/A] Date Acquired: [1:03/08/2018] [N/A:N/A] Weeks of Treatment: [1:3] [N/A:N/A] Wound Status: [1:Open] [N/A:N/A] Measurements L x W x D [  1:4.5x2.6x0.1] [N/A:N/A] (cm) Area (cm) : [1:9.189] [N/A:N/A] Volume (cm) : [1:0.919] [N/A:N/A] % Reduction in Area: [1:7.10%] [N/A:N/A] % Reduction in Volume: [1:53.60%] [N/A:N/A] Classification: [1:Full Thickness Without Exposed Support Structures] [N/A:N/A] Exudate Amount: [1:Large] [N/A:N/A] Exudate Type: [1:Serous] [N/A:N/A] Exudate Color: [1:amber] [N/A:N/A] Wound Margin: [1:Flat and Intact] [N/A:N/A] Granulation Amount: [1:Small (1-33%)] [N/A:N/A] Granulation Quality: [1:Red, Hyper-granulation] [N/A:N/A] Necrotic Amount: [1:Medium (34-66%)] [N/A:N/A] Necrotic Tissue: [1:Eschar, Adherent Slough] [N/A:N/A] Exposed Structures: [1:Fat Layer (Subcutaneous Tissue) Exposed: Yes Fascia: No Tendon: No Muscle: No Joint: No Bone: No] [N/A:N/A] Periwound Skin Texture: [1:Excoriation: No Induration: No Callus: No Crepitus: No] [N/A:N/A] Rash: No Scarring: No Periwound Skin Moisture: Maceration: No N/A N/A Dry/Scaly: No Periwound Skin Color: Hemosiderin Staining: Yes N/A N/A Rubor: Yes Atrophie Blanche: No Cyanosis: No Ecchymosis: No Erythema:  No Mottled: No Pallor: No Tenderness on Palpation: No N/A N/A Wound Preparation: Ulcer Cleansing: Other: soap N/A N/A and water Topical Anesthetic Applied: Other: lidocaine 4% Treatment Notes Electronic Signature(s) Signed: 09/03/2018 6:27:07 PM By: Elliot Gurney, BSN, RN, CWS, Kim RN, BSN Entered By: Elliot Gurney, BSN, RN, CWS, Kim on 09/03/2018 16:12:36 Johnathan Sexton ([id number]) -------------------------------------------------------------------------------- Multi-Disciplinary Care Plan Details Patient Name: Johnathan Sexton Date of Service: 09/03/2018 3:30 PM Medical Record Number: [id number] Patient Account Number: [id number] Date of Birth/Sex: 1965-12-14 (52 y.o. M) Treating RN: Huel Coventry Primary Care Forrestine Lecrone: Birdie Sons, ERIC Other Clinician: Referring Abygayle Deltoro: Birdie Sons, ERIC Treating Annalise Mcdiarmid/Extender: Linwood Dibbles, HOYT Weeks in Treatment: 3 Active Inactive ` Necrotic Tissue Nursing Diagnoses: Knowledge deficit related to management of necrotic/devitalized tissue Goals: Necrotic/devitalized tissue will be minimized in the wound bed Date Initiated: 08/09/2018 Target Resolution Date: 09/09/2018 Goal Status: Active Interventions: Assess patient pain level pre-, during and post procedure and prior to discharge Treatment Activities: Apply topical anesthetic as ordered : 08/09/2018 Excisional debridement : 08/09/2018 Notes: ` Orientation to the Wound Care Program Nursing Diagnoses: Knowledge deficit related to the wound healing center program Goals: Patient/caregiver will verbalize understanding of the Wound Healing Center Program Date Initiated: 08/09/2018 Target Resolution Date: 09/09/2018 Goal Status: Active Interventions: Provide education on orientation to the wound center Notes: ` Wound/Skin Impairment Nursing Diagnoses: Impaired tissue integrity Knowledge deficit related to smoking impact on wound healing Goals: Johnathan Sexton ([id number]) Ulcer/skin  breakdown will have a volume reduction of 30% by week 4 Date Initiated: 08/09/2018 Target Resolution Date: 09/09/2018 Goal Status: Active Interventions: Assess patient/caregiver ability to obtain necessary supplies Assess ulceration(s) every visit Treatment Activities: Topical wound management initiated : 08/09/2018 Notes: Electronic Signature(s) Signed: 09/03/2018 6:27:07 PM By: Elliot Gurney, BSN, RN, CWS, Kim RN, BSN Entered By: Elliot Gurney, BSN, RN, CWS, Kim on 09/03/2018 16:12:29 Johnathan Sexton ([id number]) -------------------------------------------------------------------------------- Pain Assessment Details Patient Name: Johnathan Sexton Date of Service: 09/03/2018 3:30 PM Medical Record Number: [id number] Patient Account Number: [id number] Date of Birth/Sex: 08-21-1966 (52 y.o. M) Treating RN: Rema Jasmine Primary Care Annis Lagoy: Birdie Sons ERIC Other Clinician: Referring Bianney Rockwood: Birdie Sons, ERIC Treating Kolbie Lepkowski/Extender: Linwood Dibbles, HOYT Weeks in Treatment: 3 Active Problems Location of Pain Severity and Description of Pain Patient Has Paino No Site Locations Pain Management and Medication Current Pain Management: Goals for Pain Management pt denies any pain at this time. Electronic Signature(s) Signed: 09/03/2018 4:46:57 PM By: Rema Jasmine Entered By: Rema Jasmine on 09/03/2018 15:44:08 Johnathan Sexton ([id number]) -------------------------------------------------------------------------------- Patient/Caregiver Education Details Patient Name: Johnathan Sexton Date of Service: 09/03/2018 3:30 PM Medical Record Number: [id number] Patient Account Number: [id number] Date of Birth/Gender: [birth year] (52 y.o. M) Treating RN: Huel Coventry Primary Care Physician: Birdie Sons, ERIC Other Clinician: Referring Physician: Birdie Sons, ERIC Treating  Physician/Extender: Skeet Simmer in Treatment: 3 Education Assessment Education Provided To: Patient Education Topics  Provided Wound/Skin Impairment: Handouts: Caring for Your Ulcer Methods: Demonstration, Explain/Verbal Responses: State content correctly Electronic Signature(s) Signed: 09/03/2018 6:27:07 PM By: Elliot Gurney, BSN, RN, CWS, Kim RN, BSN Entered By: Elliot Gurney, BSN, RN, CWS, Kim on 09/03/2018 16:32:52 Johnathan Sexton ([id number]) -------------------------------------------------------------------------------- Wound Assessment Details Patient Name: Johnathan Sexton Date of Service: 09/03/2018 3:30 PM Medical Record Number: [id number] Patient Account Number: [id number] Date of Birth/Sex: 21-Jan-1966 (52 y.o. M) Treating RN: Rema Jasmine Primary Care Saveah Bahar: Birdie Sons, ERIC Other Clinician: Referring Shayanna Thatch: Birdie Sons, ERIC Treating Cono Gebhard/Extender: Linwood Dibbles, HOYT Weeks in Treatment: 3 Wound Status Wound Number: 1 Primary Etiology: Venous Leg Ulcer Wound Location: Left Lower Leg - Anterior Wound Status: Open Wounding Event: Gradually Appeared Comorbid History: Asthma, Osteoarthritis, Seizure Disorder Date Acquired: 03/08/2018 Weeks Of Treatment: 3 Clustered Wound: No Photos Photo Uploaded By: Rema Jasmine on 09/03/2018 16:20:18 Wound Measurements Length: (cm) 4.5 Width: (cm) 2.6 Depth: (cm) 0.1 Area: (cm) 9.189 Volume: (cm) 0.919 % Reduction in Area: 7.1% % Reduction in Volume: 53.6% Tunneling: No Undermining: No Wound Description Full Thickness Without Exposed Support Foul Odo Classification: Structures Slough/F Wound Margin: Flat and Intact Exudate Large Amount: Exudate Type: Serous Exudate Color: amber r After Cleansing: No ibrino Yes Wound Bed Granulation Amount: Small (1-33%) Exposed Structure Granulation Quality: Red, Hyper-granulation Fascia Exposed: No Necrotic Amount: Medium (34-66%) Fat Layer (Subcutaneous Tissue) Exposed: Yes Necrotic Quality: Eschar, Adherent Slough Tendon Exposed: No Muscle Exposed: No Joint Exposed: No Bone Exposed: No Rinke, Tywon  ([id number]) Periwound Skin Texture Texture Color No Abnormalities Noted: No No Abnormalities Noted: No Callus: No Atrophie Blanche: No Crepitus: No Cyanosis: No Excoriation: No Ecchymosis: No Induration: No Erythema: No Rash: No Hemosiderin Staining: Yes Scarring: No Mottled: No Pallor: No Moisture Rubor: Yes No Abnormalities Noted: No Dry / Scaly: No Maceration: No Wound Preparation Ulcer Cleansing: Other: soap and water, Topical Anesthetic Applied: Other: lidocaine 4%, Treatment Notes Wound #1 (Left, Anterior Lower Leg) Notes hydrafera blue, abd, 3 layer with unna to anchor Electronic Signature(s) Signed: 09/03/2018 4:46:57 PM By: Rema Jasmine Entered By: Rema Jasmine on 09/03/2018 15:56:56 Johnathan Sexton ([id number]) -------------------------------------------------------------------------------- Vitals Details Patient Name: Johnathan Sexton Date of Service: 09/03/2018 3:30 PM Medical Record Number: [id number] Patient Account Number: [id number] Date of Birth/Sex: 07-12-1966 (52 y.o. M) Treating RN: Rema Jasmine Primary Care Malcome Ambrocio: Birdie Sons, ERIC Other Clinician: Referring Daleen Steinhaus: Birdie Sons, ERIC Treating Sherrelle Prochazka/Extender: Linwood Dibbles, HOYT Weeks in Treatment: 3 Vital Signs Time Taken: 15:44 Temperature (F): 97.8 Height (in): 61 Pulse (bpm): 72 Weight (lbs): 232 Respiratory Rate (breaths/min): 16 Body Mass Index (BMI): 43.8 Blood Pressure (mmHg): 147/84 Reference Range: 80 - 120 mg / dl Electronic Signature(s) Signed: 09/03/2018 4:46:57 PM By: Rema Jasmine Entered By: Rema Jasmine on 09/03/2018 15:45:14

## 2018-09-08 NOTE — Progress Notes (Signed)
Johnathan Scott, Johnathan Scott ([id number]) Visit Report for 09/03/2018 Chief Complaint Document Details Patient Name: Johnathan Scott, Johnathan Scott Date of Service: 09/03/2018 3:30 PM Medical Record Number: [id number] Patient Account Number: [id number] Date of Birth/Sex: Oct 24, 1965 (52 y.o. M) Treating RN: Curtis Sites Primary Care Provider: Birdie Sons, ERIC Other Clinician: Referring Provider: Birdie Sons, ERIC Treating Provider/Extender: Linwood Dibbles, HOYT Weeks in Treatment: 3 Information Obtained from: Patient Chief Complaint Left Anterior LE Ulcer Electronic Signature(s) Signed: 09/04/2018 9:09:54 AM By: Lenda Kelp PA-C Entered By: Lenda Kelp on 09/03/2018 15:16:16 Johnathan Scott ([id number]) -------------------------------------------------------------------------------- Debridement Details Patient Name: Johnathan Scott Date of Service: 09/03/2018 3:30 PM Medical Record Number: [id number] Patient Account Number: [id number] Date of Birth/Sex: 04/15/1966 (52 y.o. M) Treating RN: Huel Coventry Primary Care Provider: Birdie Sons, ERIC Other Clinician: Referring Provider: Birdie Sons, ERIC Treating Provider/Extender: Linwood Dibbles, HOYT Weeks in Treatment: 3 Debridement Performed for Wound #1 Left,Anterior Lower Leg Assessment: Performed By: Physician STONE III, HOYT E., PA-C Debridement Type: Debridement Severity of Tissue Pre Fat layer exposed Debridement: Level of Consciousness (Pre- Awake and Alert procedure): Pre-procedure Verification/Time Yes - 16:15 Out Taken: Start Time: 16:15 Pain Control: Lidocaine Total Area Debrided (L x W): 2.5 (cm) x 2.6 (cm) = 6.5 (cm) Tissue and other material Viable, Non-Viable, Slough, Subcutaneous, Slough debrided: Level: Skin/Subcutaneous Tissue Debridement Description: Excisional Instrument: Curette Bleeding: Moderate Hemostasis Achieved: Pressure End Time: 16:18 Response to Treatment: Procedure was tolerated well Level of Consciousness Awake and  Alert (Post-procedure): Post Debridement Measurements of Total Wound Length: (cm) 4.5 Width: (cm) 2.6 Depth: (cm) 0.2 Volume: (cm) 1.838 Character of Wound/Ulcer Post Debridement: Stable Severity of Tissue Post Debridement: Fat layer exposed Post Procedure Diagnosis Same as Pre-procedure Electronic Signature(s) Signed: 09/03/2018 6:27:07 PM By: Elliot Gurney, BSN, RN, CWS, Kim RN, BSN Signed: 09/04/2018 9:09:54 AM By: Lenda Kelp PA-C Entered By: Elliot Gurney, BSN, RN, CWS, Kim on 09/03/2018 16:16:25 Johnathan Scott ([id number]) -------------------------------------------------------------------------------- HPI Details Patient Name: Johnathan Scott Date of Service: 09/03/2018 3:30 PM Medical Record Number: [id number] Patient Account Number: [id number] Date of Birth/Sex: March 26, 1966 (52 y.o. M) Treating RN: Curtis Sites Primary Care Provider: Birdie Sons, ERIC Other Clinician: Referring Provider: Birdie Sons, ERIC Treating Provider/Extender: Linwood Dibbles, HOYT Weeks in Treatment: 3 History of Present Illness HPI Description: 08/09/18 on evaluation today patient presents for initial evaluation and clinic as referral concerning an ulcer that he has on the left anterior lower Trinity which has been present for five months. He states that he actually noticed this after having an "insect bite" while driving down the road. He states that he did not see what kind of insect it was but he felt it wrong up his leg and then have the bite. Since that time is been having issues with the fact that this area does not want to heal. He does have a history of asthma as well as anemia is most recent CBC revealed a hemoglobin of 11.1 with a hematocrit of 34.6. He does have a history of panic attacks as well intermittently. No fevers, chills, nausea, or vomiting noted at this time. Specifically the patient does not appear to have any infection in regard to the left lower extremity. He's not on the current  antibiotics. 08/16/18 upon evaluation today patient actually appears to be doing significantly better in regard to his wound. He has been tolerating the dressing changes that he was performing on his own over the last week. Subsequently we did not wrap his leg at that time although we discussed it. We weren't sure that he was gonna be  able to get his pants on top of the dressing. Nonetheless the patient fortunately came today with shorts on he states that he is willing to be wrapped if it will help the wound to heal faster. He is pleased with the fact that the wound already appears to be doing better it's also not hurting as badly. The last debridement was a little bit more uncomfortable for him unfortunately. Nonetheless he states he's willing to allow me to debride it again today if I do so carefully. 08/24/18 upon evaluation today patient actually appears to be doing rather well in regard to his lower Trinity ulcer on the left. He has been tolerating the dressing changes with Iodoflex including the wrap without complication and the wound [place] into making signs of good progress. Overall I'm very pleased with how things appear at this time. No fevers, chills, nausea, or vomiting noted at this time. 09/03/18 on evaluation today patient actually appears to be doing rather well in regard to his wound. In fact the overall measurement belies the fact that the wound is actually healed quite a bit more than what the measurement would suggest. In fact it's almost half the size that it was during the last evaluation unfortunately a lot of the area where new skin has grown over is in between two areas that had to be clustered together one of the distal portion of the wound has not completely healed up. Nonetheless there is actually quite a bit more healing than what seems to be represented by the wound measurement today. Overall I'm very pleased with the overall progress and how he seems to be doing with the  dressing changes. The wrap seems to be of great benefit for him as well. Electronic Signature(s) Signed: 09/04/2018 9:09:54 AM By: Lenda Kelp PA-C Entered By: Lenda Kelp on 09/04/2018 09:05:57 Johnathan Scott ([id number]) -------------------------------------------------------------------------------- Physical Exam Details Patient Name: Johnathan Scott Date of Service: 09/03/2018 3:30 PM Medical Record Number: [id number] Patient Account Number: [id number] Date of Birth/Sex: 05-Oct-1966 (52 y.o. M) Treating RN: Curtis Sites Primary Care Provider: Birdie Sons, ERIC Other Clinician: Referring Provider: Birdie Sons, ERIC Treating Provider/Extender: STONE III, HOYT Weeks in Treatment: 3 Constitutional Well-nourished and well-hydrated in no acute distress. Respiratory normal breathing without difficulty. Psychiatric this patient is able to make decisions and demonstrates good insight into disease process. Alert and Oriented x 3. pleasant and cooperative. Notes Currently patient's wound bed does appear to be doing well at this time. There does not appear to be any evidence of infection. I didn't have to perform sharp debridement to clear away some of the necrotic tissue which the patient tolerated today without complication. Post debridement the wound bed appears to be doing much better. There's a lot of good epithelialization noted as well. Electronic Signature(s) Signed: 09/04/2018 9:09:54 AM By: Lenda Kelp PA-C Entered By: Lenda Kelp on 09/04/2018 09:07:09 Johnathan Scott ([id number]) -------------------------------------------------------------------------------- Physician Orders Details Patient Name: Johnathan Scott Date of Service: 09/03/2018 3:30 PM Medical Record Number: [id number] Patient Account Number: [id number] Date of Birth/Sex: 1965-11-24 (52 y.o. M) Treating RN: Huel Coventry Primary Care Provider: Birdie Sons, ERIC Other Clinician: Referring Provider:  Birdie Sons, ERIC Treating Provider/Extender: Linwood Dibbles, HOYT Weeks in Treatment: 3 Verbal / Phone Orders: No Diagnosis Coding ICD-10 Coding Code Description I87.2 Venous insufficiency (chronic) (peripheral) L97.822 Non-pressure chronic ulcer of other part of left lower leg with fat layer exposed D50.9 Iron deficiency anemia, unspecified J45.998 Other asthma Wound Cleansing Wound #1 Left,Anterior Lower Leg o Clean wound  with Normal Saline. Anesthetic (add to Medication List) Wound #1 Left,Anterior Lower Leg o Topical Lidocaine 4% cream applied to wound bed prior to debridement (In Clinic Only). Primary Wound Dressing Wound #1 Left,Anterior Lower Leg o Hydrafera Blue Ready Transfer Secondary Dressing Wound #1 Left,Anterior Lower Leg o ABD pad Dressing Change Frequency Wound #1 Left,Anterior Lower Leg o Change dressing every week Follow-up Appointments Wound #1 Left,Anterior Lower Leg o Return Appointment in 1 week. Edema Control Wound #1 Left,Anterior Lower Leg o 3 Layer Compression System - Left Lower Extremity o Elevate legs to the level of the heart and pump ankles as often as possible Electronic Signature(s) Signed: 09/03/2018 6:27:07 PM By: Elliot GurneyWoody, BSN, RN, CWS, Kim RN, BSN Signed: 09/04/2018 9:09:54 AM By: Ames DuraStone III, Hoyt PA-C Cupo, Johnathan Scott ([id number]) Entered By: Elliot GurneyWoody, BSN, RN, CWS, Kim on 09/03/2018 16:19:09 Johnathan Scott, Johnathan Scott ([id number]) -------------------------------------------------------------------------------- Problem List Details Patient Name: Johnathan Scott, Johnathan Scott Date of Service: 09/03/2018 3:30 PM Medical Record Number: [id number] Patient Account Number: [id number] Date of Birth/Sex: 01-02-1966 (52 y.o. M) Treating RN: Curtis Sitesorthy, Joanna Primary Care Provider: Birdie SonsSONNENBERG, ERIC Other Clinician: Referring Provider: Birdie SonsSONNENBERG, ERIC Treating Provider/Extender: Linwood DibblesSTONE III, HOYT Weeks in Treatment: 3 Active Problems ICD-10 Evaluated  Encounter Code Description Active Date Today Diagnosis I87.2 Venous insufficiency (chronic) (peripheral) 08/09/2018 No Yes L97.822 Non-pressure chronic ulcer of other part of left lower leg with 08/09/2018 No Yes fat layer exposed D50.9 Iron deficiency anemia, unspecified 08/09/2018 No Yes J45.998 Other asthma 08/09/2018 No Yes Inactive Problems Resolved Problems Electronic Signature(s) Signed: 09/04/2018 9:09:54 AM By: Lenda KelpStone III, Hoyt PA-C Entered By: Lenda KelpStone III, Hoyt on 09/03/2018 15:16:12 Johnathan Scott, Johnathan ([id number]) -------------------------------------------------------------------------------- Progress Note Details Patient Name: Johnathan Scott, Johnathan Scott Date of Service: 09/03/2018 3:30 PM Medical Record Number: [id number] Patient Account Number: [id number] Date of Birth/Sex: 01-02-1966 ([age] y.o. M) Treating RN: Curtis Sitesorthy, Joanna Primary Care Provider: Birdie SonsSONNENBERG, ERIC Other Clinician: Referring Provider: Birdie SonsSONNENBERG, ERIC Treating Provider/Extender: Linwood DibblesSTONE III, HOYT Weeks in Treatment: 3 Subjective Chief Complaint Information obtained from Patient Left Anterior LE Ulcer History of Present Illness (HPI) 08/09/18 on evaluation today patient presents for initial evaluation and clinic as referral concerning an ulcer that he has on the left anterior lower [place] which has been present for five months. He states that he actually noticed this after having an "insect bite" while driving down the road. He states that he did not see what kind of insect it was but he felt it wrong up his leg and then have the bite. Since that time is been having issues with the fact that this area does not want to heal. He does have a history of asthma as well as anemia is most recent CBC revealed a hemoglobin of 11.1 with a hematocrit of 34.6. He does have a history of panic attacks as well intermittently. No fevers, chills, nausea, or vomiting noted at this time. Specifically the patient does not appear to have any  infection in regard to the left lower extremity. He's not on the current antibiotics. 08/16/18 upon evaluation today patient actually appears to be doing significantly better in regard to his wound. He has been tolerating the dressing changes that he was performing on his own over the last week. Subsequently we did not wrap his leg at that time although we discussed it. We weren't sure that he was gonna be able to get his pants on top of the dressing. Nonetheless the patient fortunately came today with shorts on he states that he is willing to be wrapped if it will help  the wound to heal faster. He is pleased with the fact that the wound already appears to be doing better it's also not hurting as badly. The last debridement was a little bit more uncomfortable for him unfortunately. Nonetheless he states he's willing to allow me to debride it again today if I do so carefully. 08/24/18 upon evaluation today patient actually appears to be doing rather well in regard to his lower Trinity ulcer on the left. He has been tolerating the dressing changes with Iodoflex including the wrap without complication and the wound [place] into making signs of good progress. Overall I'm very pleased with how things appear at this time. No fevers, chills, nausea, or vomiting noted at this time. 09/03/18 on evaluation today patient actually appears to be doing rather well in regard to his wound. In fact the overall measurement belies the fact that the wound is actually healed quite a bit more than what the measurement would suggest. In fact it's almost half the size that it was during the last evaluation unfortunately a lot of the area where new skin has grown over is in between two areas that had to be clustered together one of the distal portion of the wound has not completely healed up. Nonetheless there is actually quite a bit more healing than what seems to be represented by the wound measurement today. Overall I'm very  pleased with the overall progress and how he seems to be doing with the dressing changes. The wrap seems to be of great benefit for him as well. Patient History Information obtained from Patient. Family History Cancer - Mother,Father,Siblings, Diabetes - Siblings, Heart Disease - Siblings, Hypertension - Siblings, No family history of Hereditary Spherocytosis, Kidney Disease, Lung Disease, Seizures, Stroke, Thyroid Problems, Tuberculosis. Social History Former smoker - 7 years, Marital Status - Single, Alcohol Use - Rarely, Drug Use - No History, Caffeine Use - Never. Johnathan Scott, Johnathan Scott ([id number]) Review of Systems (ROS) Constitutional Symptoms (General Health) Denies complaints or symptoms of Fever, Chills. Respiratory The patient has no complaints or symptoms. Cardiovascular Complains or has symptoms of LE edema. Psychiatric The patient has no complaints or symptoms. Objective Constitutional Well-nourished and well-hydrated in no acute distress. Vitals Time Taken: 3:44 PM, Height: 61 in, Weight: 232 lbs, BMI: 43.8, Temperature: 97.8 F, Pulse: 72 bpm, Respiratory Rate: 16 breaths/min, Blood Pressure: 147/84 mmHg. Respiratory normal breathing without difficulty. Psychiatric this patient is able to make decisions and demonstrates good insight into disease process. Alert and Oriented x 3. pleasant and cooperative. General Notes: Currently patient's wound bed does appear to be doing well at this time. There does not appear to be any evidence of infection. I didn't have to perform sharp debridement to clear away some of the necrotic tissue which the patient tolerated today without complication. Post debridement the wound bed appears to be doing much better. There's a lot of good epithelialization noted as well. Integumentary (Hair, Skin) Wound #1 status is Open. Original cause of wound was Gradually Appeared. The wound is located on the Left,Anterior Lower Leg. The wound measures  4.5cm length x 2.6cm width x 0.1cm depth; 9.189cm^2 area and 0.919cm^3 volume. There is Fat Layer (Subcutaneous Tissue) Exposed exposed. There is no tunneling or undermining noted. There is a large amount of serous drainage noted. The wound margin is flat and intact. There is small (1-33%) red, hyper - granulation within the wound bed. There is a medium (34-66%) amount of necrotic tissue within the wound bed including Eschar and Adherent  Slough. The periwound skin appearance exhibited: Hemosiderin Staining, Rubor. The periwound skin appearance did not exhibit: Callus, Crepitus, Excoriation, Induration, Rash, Scarring, Dry/Scaly, Maceration, Atrophie Blanche, Cyanosis, Ecchymosis, Mottled, Pallor, Erythema. Assessment Active Problems ICD-10 Johnathan Scott, Johnathan Scott ([id number]) Venous insufficiency (chronic) (peripheral) Non-pressure chronic ulcer of other part of left lower leg with fat layer exposed Iron deficiency anemia, unspecified Other asthma Procedures Wound #1 Pre-procedure diagnosis of Wound #1 is a Venous Leg Ulcer located on the Left,Anterior Lower Leg .Severity of Tissue Pre Debridement is: Fat layer exposed. There was a Excisional Skin/Subcutaneous Tissue Debridement with a total area of 6.5 sq cm performed by STONE III, HOYT E., PA-C. With the following instrument(s): Curette to remove Viable and Non-Viable tissue/material. Material removed includes Subcutaneous Tissue and Slough and after achieving pain control using Lidocaine. No specimens were taken. A time out was conducted at 16:15, prior to the start of the procedure. A Moderate amount of bleeding was controlled with Pressure. The procedure was tolerated well. Post Debridement Measurements: 4.5cm length x 2.6cm width x 0.2cm depth; 1.838cm^3 volume. Character of Wound/Ulcer Post Debridement is stable. Severity of Tissue Post Debridement is: Fat layer exposed. Post procedure Diagnosis Wound #1: Same as Pre-Procedure Plan Wound  Cleansing: Wound #1 Left,Anterior Lower Leg: Clean wound with Normal Saline. Anesthetic (add to Medication List): Wound #1 Left,Anterior Lower Leg: Topical Lidocaine 4% cream applied to wound bed prior to debridement (In Clinic Only). Primary Wound Dressing: Wound #1 Left,Anterior Lower Leg: Hydrafera Blue Ready Transfer Secondary Dressing: Wound #1 Left,Anterior Lower Leg: ABD pad Dressing Change Frequency: Wound #1 Left,Anterior Lower Leg: Change dressing every week Follow-up Appointments: Wound #1 Left,Anterior Lower Leg: Return Appointment in 1 week. Edema Control: Wound #1 Left,Anterior Lower Leg: 3 Layer Compression System - Left Lower Extremity Elevate legs to the level of the heart and pump ankles as often as possible At this point I'm gonna suggest that we switch to a Hydrofera Blue Dressing for the next week. We will see how things do over that period of time. He does have his FarrowWrap compression wrap as well that we were able to get for him that I think will [facility], Maisie Fus ([id number]) be beneficial as far as keeping things in control and helping it to heal appropriately. This will also help to not have to bring them it is often for rapid changes just depending on how things are progressing. The patient is in agreement with this plan. Nonetheless we will wrap them for one more week especially with the new Hydrofera Blue Dressing to see were things stand. Please see above for specific wound care orders. We will see patient for re-evaluation in 1 week(s) here in the clinic. If anything worsens or changes patient will contact our office for additional recommendations. Electronic Signature(s) Signed: 09/04/2018 9:09:54 AM By: Lenda Kelp PA-C Entered By: Lenda Kelp on 09/04/2018 09:08:33 Johnathan Scott ([id number]) -------------------------------------------------------------------------------- ROS/PFSH Details Patient Name: Johnathan Scott Date of Service:  09/03/2018 3:30 PM Medical Record Number: [id number] Patient Account Number: [id number] Date of Birth/Sex: [birth year] (52 y.o. M) Treating RN: Curtis Sites Primary Care Provider: Birdie Sons, ERIC Other Clinician: Referring Provider: Birdie Sons, ERIC Treating Provider/Extender: Linwood Dibbles, HOYT Weeks in Treatment: 3 Information Obtained From Patient Wound History Do you currently have one or more open woundso Yes How many open wounds do you currently haveo 1 Approximately how long have you had your woundso 5 months How have you been treating your wound(s) until nowo open to air Has your wound(s) ever healed  and then re-openedo No Have you had any lab work done in the past montho No Have you tested positive for an antibiotic resistant organism (MRSA, VRE)o No Have you tested positive for osteomyelitis (bone infection)o No Have you had any tests for circulation on your legso No Have you had other problems associated with your woundso Swelling Constitutional Symptoms (General Health) Complaints and Symptoms: Negative for: Fever; Chills Cardiovascular Complaints and Symptoms: Positive for: LE edema Medical History: Negative for: Angina; Arrhythmia; Congestive Heart Failure; Coronary Artery Disease; Deep Vein Thrombosis; Hypertension; Hypotension; Myocardial Infarction; Peripheral Arterial Disease; Peripheral Venous Disease; Phlebitis; Vasculitis Eyes Medical History: Negative for: Cataracts; Glaucoma; Optic Neuritis Ear/Nose/Mouth/Throat Medical History: Negative for: Chronic sinus problems/congestion; Middle ear problems Hematologic/Lymphatic Medical History: Negative for: Anemia; Hemophilia; Human Immunodeficiency Virus; Lymphedema; Sickle Cell Disease Respiratory Complaints and Symptoms: No Complaints or Symptoms Medical HistoryGERVASE, COLBERG ([id number]) Positive for: Asthma - history as a child Negative for: Aspiration; Chronic Obstructive Pulmonary Disease (COPD);  Pneumothorax; Sleep Apnea; Tuberculosis Gastrointestinal Medical History: Negative for: Cirrhosis ; Colitis; Crohnos; Hepatitis A; Hepatitis B; Hepatitis C Endocrine Medical History: Negative for: Type I Diabetes; Type II Diabetes Genitourinary Medical History: Negative for: End Stage Renal Disease Immunological Medical History: Negative for: Lupus Erythematosus; Raynaudos; Scleroderma Integumentary (Skin) Medical History: Negative for: History of Burn; History of pressure wounds Musculoskeletal Medical History: Positive for: Osteoarthritis - both hips Negative for: Gout; Rheumatoid Arthritis; Osteomyelitis Neurologic Medical History: Positive for: Seizure Disorder - as a child Negative for: Dementia; Neuropathy; Quadriplegia; Paraplegia Oncologic Medical History: Negative for: Received Chemotherapy; Received Radiation Psychiatric Complaints and Symptoms: No Complaints or Symptoms Medical History: Negative for: Anorexia/bulimia; Confinement Anxiety Immunizations Pneumococcal Vaccine: Received Pneumococcal Vaccination: No Implantable Devices URIAN, MARTENSON ([id number]) Family and Social History Cancer: Yes - Mother,Father,Siblings; Diabetes: Yes - Siblings; Heart Disease: Yes - Siblings; Hereditary Spherocytosis: No; Hypertension: Yes - Siblings; Kidney Disease: No; Lung Disease: No; Seizures: No; Stroke: No; Thyroid Problems: No; Tuberculosis: No; Former smoker - 7 years; Marital Status - Single; Alcohol Use: Rarely; Drug Use: No History; Caffeine Use: Never; Financial Concerns: No; Food, Clothing or Shelter Needs: No; Support System Lacking: No; Transportation Concerns: No; Advanced Directives: No; Patient does not want information on Advanced Directives Physician Affirmation I have reviewed and agree with the above information. Electronic Signature(s) Signed: 09/04/2018 9:09:54 AM By: Lenda Kelp PA-C Signed: 09/06/2018 4:40:46 PM By: Curtis Sites Entered By:  Lenda Kelp on 09/04/2018 09:06:48 Johnathan Scott ([id number]) -------------------------------------------------------------------------------- SuperBill Details Patient Name: Johnathan Scott Date of Service: 09/03/2018 Medical Record Number: [id number] Patient Account Number: [id number] Date of Birth/Sex: [birth year] (52 y.o. 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

## 2018-09-09 ENCOUNTER — Encounter: Payer: 59 | Admitting: Physician Assistant

## 2018-09-09 ENCOUNTER — Other Ambulatory Visit (INDEPENDENT_AMBULATORY_CARE_PROVIDER_SITE_OTHER): Payer: 59

## 2018-09-09 ENCOUNTER — Ambulatory Visit (INDEPENDENT_AMBULATORY_CARE_PROVIDER_SITE_OTHER): Payer: 59 | Admitting: *Deleted

## 2018-09-09 VITALS — BP 138/80 | HR 66 | Resp 18

## 2018-09-09 DIAGNOSIS — I10 Essential (primary) hypertension: Secondary | ICD-10-CM

## 2018-09-09 DIAGNOSIS — I872 Venous insufficiency (chronic) (peripheral): Secondary | ICD-10-CM | POA: Diagnosis not present

## 2018-09-09 DIAGNOSIS — L97822 Non-pressure chronic ulcer of other part of left lower leg with fat layer exposed: Secondary | ICD-10-CM | POA: Diagnosis not present

## 2018-09-09 DIAGNOSIS — L97922 Non-pressure chronic ulcer of unspecified part of left lower leg with fat layer exposed: Secondary | ICD-10-CM | POA: Diagnosis not present

## 2018-09-09 NOTE — Progress Notes (Signed)
Patient here for nurse visit BP check per order from 08/09/18.   Patient reports compliance with prescribed BP medications: yes  Last dose of BP medication: HCTZ 12.5 mg  BP Readings from Last 3 Encounters:  09/09/18 (!) 142/84  08/09/18 (!) 142/84  07/23/18 (!) 184/84   Pulse Readings from Last 3 Encounters:  09/09/18 66  08/09/18 68  07/23/18 68      Henrene PastorKathy Ingram Onnen, LPN

## 2018-09-09 NOTE — Progress Notes (Signed)
BP remains the same, BMET collected today with results pending. Will forward to PCP for further medication adjustment.

## 2018-09-10 LAB — BASIC METABOLIC PANEL
BUN: 32 mg/dL — ABNORMAL HIGH (ref 6–23)
CALCIUM: 9.6 mg/dL (ref 8.4–10.5)
CHLORIDE: 99 meq/L (ref 96–112)
CO2: 32 mEq/L (ref 19–32)
CREATININE: 1.28 mg/dL (ref 0.40–1.50)
GFR: 62.09 mL/min (ref >60.00)
Glucose, Bld: 82 mg/dL (ref 70–99)
Potassium: 3.6 mEq/L (ref 3.5–5.1)
Sodium: 139 mEq/L (ref 135–145)

## 2018-09-10 NOTE — Progress Notes (Signed)
Blood pressure appears to be unchanged.  Please confirm that he is taking his HCTZ.  If so we should increase dose.  He would need repeat lab work in 1 month with a blood pressure check at that time.

## 2018-09-11 NOTE — Progress Notes (Signed)
DODGER, SINNING ([id number]) Visit Report for 09/09/2018 Arrival Information Details Patient Name: Johnathan Scott, Johnathan Scott Date of Service: 09/09/2018 8:15 AM Medical Record Number: [id number] Patient Account Number: [id number] Date of Birth/Sex: 1966/09/15 (52 y.o. M) Treating RN: Curtis Sites Primary Care Miguelangel Korn: Birdie Sons, ERIC Other Clinician: Referring Chrissie Dacquisto: Birdie Sons, ERIC Treating Koda Routon/Extender: Linwood Dibbles, HOYT Weeks in Treatment: 4 Visit Information History Since Last Visit Added or deleted any medications: No Patient Arrived: Cane Any new allergies or adverse reactions: No Arrival Time: 08:08 Had a fall or experienced change in No Accompanied By: self activities of daily living that may affect Transfer Assistance: None risk of falls: Patient Identification Verified: Yes Signs or symptoms of abuse/neglect since last visito No Secondary Verification Process Yes Hospitalized since last visit: No Completed: Implantable device outside of the clinic excluding No Patient Has Alerts: Yes cellular tissue based products placed in the center Patient Alerts: PT REFUSED FACE since last visit: PHOTO Has Dressing in Place as Prescribed: Yes Pain Present Now: No Electronic Signature(s) Signed: 09/09/2018 11:45:07 AM By: Dayton Martes RCP, RRT, CHT Entered By: Dayton Martes on 09/09/2018 08:09:06 Johnathan Scott ([id number]) -------------------------------------------------------------------------------- Encounter Discharge Information Details Patient Name: Johnathan Scott Date of Service: 09/09/2018 8:15 AM Medical Record Number: [id number] Patient Account Number: [id number] Date of Birth/Sex: December 27, 1965 (52 y.o. M) Treating RN: Curtis Sites Primary Care Jaclyn Carew: Birdie Sons, ERIC Other Clinician: Referring Dawnita Molner: Birdie Sons, ERIC Treating Cataleah Stites/Extender: Linwood Dibbles, HOYT Weeks in Treatment: 4 Encounter Discharge Information Items  Post Procedure Vitals Discharge Condition: Stable Temperature (F): 97.7 Ambulatory Status: Ambulatory Pulse (bpm): 63 Discharge Destination: Home Respiratory Rate (breaths/min): 16 Transportation: Private Auto Blood Pressure (mmHg): 141/76 Accompanied By: self Schedule Follow-up Appointment: Yes Clinical Summary of Care: Electronic Signature(s) Signed: 09/09/2018 4:40:32 PM By: Curtis Sites Entered By: Curtis Sites on 09/09/2018 08:47:40 Johnathan Scott ([id number]) -------------------------------------------------------------------------------- Lower Extremity Assessment Details Patient Name: Johnathan Scott Date of Service: 09/09/2018 8:15 AM Medical Record Number: [id number] Patient Account Number: [id number] Date of Birth/Sex: 1966-07-16 (52 y.o. M) Treating RN: Curtis Sites Primary Care Latwan Luchsinger: Birdie Sons, ERIC Other Clinician: Referring Coni Homesley: Birdie Sons, ERIC Treating Dare Sanger/Extender: Linwood Dibbles, HOYT Weeks in Treatment: 4 Edema Assessment Assessed: [Left: No] [Right: No] [Left: Edema] [Right: :] Calf Left: Right: Point of Measurement: 34 cm From Medial Instep 53 cm cm Ankle Left: Right: Point of Measurement: 10 cm From Medial Instep 25 cm cm Vascular Assessment Claudication: Claudication Assessment [Left:None] Pulses: Dorsalis Pedis Palpable: [Left:Yes] Posterior Tibial Palpable: [Left:Yes] Extremity colors, hair growth, and conditions: Extremity Color: [Left:Hyperpigmented] Hair Growth on Extremity: [Left:No] Temperature of Extremity: [Left:Warm] Capillary Refill: [Left:< 3 seconds] Dependent Rubor: [Left:No] Blanched when Elevated: [Left:No] Lipodermatosclerosis: [Left:No] Toe Nail Assessment Left: Right: Thick: Yes Discolored: Yes Improper Length and Hygiene: No Electronic Signature(s) Signed: 09/09/2018 4:40:32 PM By: Curtis Sites Entered By: Curtis Sites on 09/09/2018 08:27:43 Johnathan Scott  ([id number]) -------------------------------------------------------------------------------- Multi Wound Chart Details Patient Name: Johnathan Scott Date of Service: 09/09/2018 8:15 AM Medical Record Number: [id number] Patient Account Number: [id number] Date of Birth/Sex: [birth year] (52 y.o. M) Treating RN: Curtis Sites Primary Care Eldon Zietlow: Birdie Sons, ERIC Other Clinician: Referring Heavenleigh Petruzzi: Birdie Sons, ERIC Treating Susannah Carbin/Extender: Linwood Dibbles, HOYT Weeks in Treatment: 4 Vital Signs Height(in): 61 Pulse(bpm): 63 Weight(lbs): 232 Blood Pressure(mmHg): 141/76 Body Mass Index(BMI): 44 Temperature(F): 97.7 Respiratory Rate 16 (breaths/min): Photos: [1:No Photos] [N/A:N/A] Wound Location: [1:Left Lower Leg - Anterior] [N/A:N/A] Wounding Event: [1:Gradually Appeared] [N/A:N/A] Primary Etiology: [1:Venous Leg Ulcer] [N/A:N/A] Comorbid History: [1:Asthma, Osteoarthritis, Seizure Disorder] [N/A:N/A] Date Acquired: [1:03/08/2018] [N/A:N/A] Weeks of Treatment: [1:4] [  N/A:N/A] Wound Status: [1:Open] [N/A:N/A] Measurements L x W x D [1:2.1x2x0.1] [N/A:N/A] (cm) Area (cm) : [1:3.299] [N/A:N/A] Volume (cm) : [1:0.33] [N/A:N/A] % Reduction in Area: [1:66.70%] [N/A:N/A] % Reduction in Volume: [1:83.30%] [N/A:N/A] Classification: [1:Full Thickness Without Exposed Support Structures] [N/A:N/A] Exudate Amount: [1:Large] [N/A:N/A] Exudate Type: [1:Serous] [N/A:N/A] Exudate Color: [1:amber] [N/A:N/A] Wound Margin: [1:Flat and Intact] [N/A:N/A] Granulation Amount: [1:Small (1-33%)] [N/A:N/A] Granulation Quality: [1:Red, Hyper-granulation] [N/A:N/A] Necrotic Amount: [1:Medium (34-66%)] [N/A:N/A] Necrotic Tissue: [1:Eschar, Adherent Slough] [N/A:N/A] Exposed Structures: [1:Fat Layer (Subcutaneous Tissue) Exposed: Yes Fascia: No Tendon: No Muscle: No Joint: No Bone: No] [N/A:N/A] Epithelialization: [1:Small (1-33%)] [N/A:N/A] Periwound Skin Texture: [1:Excoriation: No Induration:  No Callus: No Crepitus: No] [N/A:N/A] Rash: No Scarring: No Periwound Skin Moisture: Maceration: No N/A N/A Dry/Scaly: No Periwound Skin Color: Hemosiderin Staining: Yes N/A N/A Rubor: Yes Atrophie Blanche: No Cyanosis: No Ecchymosis: No Erythema: No Mottled: No Pallor: No Temperature: No Abnormality N/A N/A Tenderness on Palpation: Yes N/A N/A Wound Preparation: Ulcer Cleansing: Other: soap N/A N/A and water Topical Anesthetic Applied: Other: lidocaine 4% Treatment Notes Electronic Signature(s) Signed: 09/09/2018 4:40:32 PM By: Curtis Sitesorthy, Joanna Entered By: Curtis Sitesorthy, Joanna on 09/09/2018 08:38:16 Johnathan Scott, Johnathan Scott ([id number]) -------------------------------------------------------------------------------- Multi-Disciplinary Care Plan Details Patient Name: Johnathan Scott, Johnathan Scott Date of Service: 09/09/2018 8:15 AM Medical Record Number: [id number] Patient Account Number: [id number] Date of Birth/Sex: 30-Jan-1966 (52 y.o. M) Treating RN: Curtis Sitesorthy, Joanna Primary Care Rande Dario: Birdie SonsSONNENBERG, ERIC Other Clinician: Referring Lailynn Southgate: Birdie SonsSONNENBERG, ERIC Treating Kyzer Blowe/Extender: Linwood DibblesSTONE III, HOYT Weeks in Treatment: 4 Active Inactive ` Necrotic Tissue Nursing Diagnoses: Knowledge deficit related to management of necrotic/devitalized tissue Goals: Necrotic/devitalized tissue will be minimized in the wound bed Date Initiated: 08/09/2018 Target Resolution Date: 09/09/2018 Goal Status: Active Interventions: Assess patient pain level pre-, during and post procedure and prior to discharge Treatment Activities: Apply topical anesthetic as ordered : 08/09/2018 Excisional debridement : 08/09/2018 Notes: ` Orientation to the Wound Care Program Nursing Diagnoses: Knowledge deficit related to the wound healing center program Goals: Patient/caregiver will verbalize understanding of the Wound Healing Center Program Date Initiated: 08/09/2018 Target Resolution Date: 09/09/2018 Goal Status:  Active Interventions: Provide education on orientation to the wound center Notes: ` Wound/Skin Impairment Nursing Diagnoses: Impaired tissue integrity Knowledge deficit related to smoking impact on wound healing GoalsRayna Scott: Joe, Kendrick ([id number]) Ulcer/skin breakdown will have a volume reduction of 30% by week 4 Date Initiated: 08/09/2018 Target Resolution Date: 09/09/2018 Goal Status: Active Interventions: Assess patient/caregiver ability to obtain necessary supplies Assess ulceration(s) every visit Treatment Activities: Topical wound management initiated : 08/09/2018 Notes: Electronic Signature(s) Signed: 09/09/2018 4:40:32 PM By: Curtis Sitesorthy, Joanna Entered By: Curtis Sitesorthy, Joanna on 09/09/2018 08:38:01 Johnathan Scott, Johnathan Scott ([id number]) -------------------------------------------------------------------------------- Pain Assessment Details Patient Name: Johnathan Scott, Johnathan Scott Date of Service: 09/09/2018 8:15 AM Medical Record Number: [id number] Patient Account Number: [id number] Date of Birth/Sex: 30-Jan-1966 (52 y.o. M) Treating RN: Curtis Sitesorthy, Joanna Primary Care Nykolas Bacallao: Birdie SonsSONNENBERG, ERIC Other Clinician: Referring Quadre Bristol: Birdie SonsSONNENBERG, ERIC Treating Josefina Rynders/Extender: Linwood DibblesSTONE III, HOYT Weeks in Treatment: 4 Active Problems Location of Pain Severity and Description of Pain Patient Has Paino No Site Locations Pain Management and Medication Current Pain Management: Electronic Signature(s) Signed: 09/09/2018 11:45:07 AM By: Dayton MartesWallace, RCP,RRT,CHT, Sallie RCP, RRT, CHT Signed: 09/09/2018 4:40:32 PM By: Curtis Sitesorthy, Joanna Entered By: Dayton MartesWallace, RCP,RRT,CHT, Sallie on 09/09/2018 08:09:15 Johnathan Scott, Johnathan Scott ([id number]) -------------------------------------------------------------------------------- Patient/Caregiver Education Details Patient Name: Johnathan Scott, Johnathan Scott Date of Service: 09/09/2018 8:15 AM Medical Record Number: [id number] Patient Account Number: [id number] Date of Birth/Gender: 30-Jan-1966 (52  y.o. M) Treating RN: Curtis Sitesorthy, Joanna Primary Care Physician: Birdie SonsSONNENBERG, ERIC Other  Clinician: Referring Physician: Birdie Sons, ERIC Treating Physician/Extender: Skeet Simmer in Treatment: 4 Education Assessment Education Provided To: Patient Education Topics Provided Wound/Skin Impairment: Handouts: Caring for Your Ulcer Methods: Demonstration, Explain/Verbal Responses: State content correctly Electronic Signature(s) Signed: 09/09/2018 4:40:32 PM By: Curtis Sites Entered By: Curtis Sites on 09/09/2018 08:45:13 Johnathan Scott ([id number]) -------------------------------------------------------------------------------- Wound Assessment Details Patient Name: Johnathan Scott Date of Service: 09/09/2018 8:15 AM Medical Record Number: [id number] Patient Account Number: [id number] Date of Birth/Sex: 17-May-1966 (52 y.o. M) Treating RN: Curtis Sites Primary Care Mazikeen Hehn: Birdie Sons, ERIC Other Clinician: Referring Korde Jeppsen: Birdie Sons, ERIC Treating Lynde Ludwig/Extender: Linwood Dibbles, HOYT Weeks in Treatment: 4 Wound Status Wound Number: 1 Primary Etiology: Venous Leg Ulcer Wound Location: Left Lower Leg - Anterior Wound Status: Open Wounding Event: Gradually Appeared Comorbid History: Asthma, Osteoarthritis, Seizure Disorder Date Acquired: 03/08/2018 Weeks Of Treatment: 4 Clustered Wound: No Wound Measurements Length: (cm) 2.1 Width: (cm) 2 Depth: (cm) 0.1 Area: (cm) 3.299 Volume: (cm) 0.33 % Reduction in Area: 66.7% % Reduction in Volume: 83.3% Epithelialization: Small (1-33%) Tunneling: No Undermining: No Wound Description Full Thickness Without Exposed Support Classification: Structures Wound Margin: Flat and Intact Exudate Large Amount: Exudate Type: Serous Exudate Color: amber Foul Odor After Cleansing: No Slough/Fibrino Yes Wound Bed Granulation Amount: Small (1-33%) Exposed Structure Granulation Quality: Red, Hyper-granulation Fascia  Exposed: No Necrotic Amount: Medium (34-66%) Fat Layer (Subcutaneous Tissue) Exposed: Yes Necrotic Quality: Eschar, Adherent Slough Tendon Exposed: No Muscle Exposed: No Joint Exposed: No Bone Exposed: No Periwound Skin Texture Texture Color No Abnormalities Noted: No No Abnormalities Noted: No Callus: No Atrophie Blanche: No Crepitus: No Cyanosis: No Excoriation: No Ecchymosis: No Induration: No Erythema: No Rash: No Hemosiderin Staining: Yes Scarring: No Mottled: No Pallor: No Moisture Rubor: Yes No Abnormalities Noted: No Dry / Scaly: No Temperature / Pain Buenger, Rathana ([id number]) Maceration: No Temperature: No Abnormality Tenderness on Palpation: Yes Wound Preparation Ulcer Cleansing: Other: soap and water, Topical Anesthetic Applied: Other: lidocaine 4%, Treatment Notes Wound #1 (Left, Anterior Lower Leg) Notes hydrafera blue, telfa island, velcro compression Electronic Signature(s) Signed: 09/09/2018 4:40:32 PM By: Curtis Sites Entered By: Curtis Sites on 09/09/2018 08:25:57 Johnathan Scott ([id number]) -------------------------------------------------------------------------------- Vitals Details Patient Name: Johnathan Scott Date of Service: 09/09/2018 8:15 AM Medical Record Number: [id number] Patient Account Number: [id number] Date of Birth/Sex: 11-Dec-1965 (52 y.o. M) Treating RN: Curtis Sites Primary Care Novaleigh Kohlman: Birdie Sons, ERIC Other Clinician: Referring Ondria Oswald: Birdie Sons, ERIC Treating Breton Berns/Extender: Linwood Dibbles, HOYT Weeks in Treatment: 4 Vital Signs Time Taken: 08:05 Temperature (F): 97.7 Height (in): 61 Pulse (bpm): 63 Weight (lbs): 232 Respiratory Rate (breaths/min): 16 Body Mass Index (BMI): 43.8 Blood Pressure (mmHg): 141/76 Reference Range: 80 - 120 mg / dl Electronic Signature(s) Signed: 09/09/2018 11:45:07 AM By: Dayton Martes RCP, RRT, CHT Entered By: Dayton Martes on 09/09/2018  08:12:55

## 2018-09-11 NOTE — Progress Notes (Addendum)
RICKI, CLACK ([id number]) Visit Report for 09/09/2018 Chief Complaint Document Details Patient Name: Johnathan Scott, Johnathan Scott Date of Service: 09/09/2018 8:15 AM Medical Record Number: [id number] Patient Account Number: [id number] Date of Birth/Sex: 1966/06/29 (52 y.o. M) Treating RN: Curtis Sites Primary Care Provider: Birdie Sons, ERIC Other Clinician: Referring Provider: Birdie Sons, ERIC Treating Provider/Extender: Linwood Dibbles, Lynton Crescenzo Weeks in Treatment: 4 Information Obtained from: Patient Chief Complaint Left Anterior LE Ulcer Electronic Signature(s) Signed: 09/09/2018 4:34:16 PM By: Lenda Kelp PA-C Entered By: Lenda Kelp on 09/09/2018 08:19:22 Johnathan Scott ([id number]) -------------------------------------------------------------------------------- Debridement Details Patient Name: Johnathan Scott Date of Service: 09/09/2018 8:15 AM Medical Record Number: [id number] Patient Account Number: [id number] Date of Birth/Sex: 1966/04/04 (52 y.o. M) Treating RN: Curtis Sites Primary Care Provider: Birdie Sons, ERIC Other Clinician: Referring Provider: Birdie Sons, ERIC Treating Provider/Extender: Linwood Dibbles, Haidar Muse Weeks in Treatment: 4 Debridement Performed for Wound #1 Left,Anterior Lower Leg Assessment: Performed By: Physician STONE III, Beecher Furio E., PA-C Debridement Type: Debridement Severity of Tissue Pre Fat layer exposed Debridement: Level of Consciousness (Pre- Awake and Alert procedure): Pre-procedure Verification/Time Yes - 08:40 Out Taken: Start Time: 08:40 Pain Control: Other : benzocaine 20% Total Area Debrided (L x W): 2.1 (cm) x 2 (cm) = 4.2 (cm) Tissue and other material Slough, Subcutaneous, Biofilm, Slough debrided: Level: Skin/Subcutaneous Tissue Debridement Description: Excisional Instrument: Curette Bleeding: Minimum Hemostasis Achieved: Pressure End Time: 08:45 Procedural Pain: 0 Post Procedural Pain: 0 Response to Treatment: Procedure was  tolerated well Level of Consciousness Awake and Alert (Post-procedure): Post Debridement Measurements of Total Wound Length: (cm) 2.1 Width: (cm) 2 Depth: (cm) 0.1 Volume: (cm) 0.33 Character of Wound/Ulcer Post Debridement: Improved Severity of Tissue Post Debridement: Fat layer exposed Post Procedure Diagnosis Same as Pre-procedure Electronic Signature(s) Signed: 09/09/2018 4:34:16 PM By: Lenda Kelp PA-C Signed: 09/09/2018 4:40:32 PM By: Curtis Sites Entered By: Curtis Sites on 09/09/2018 08:43:12 Johnathan Scott ([id number]) -------------------------------------------------------------------------------- HPI Details Patient Name: Johnathan Scott Date of Service: 09/09/2018 8:15 AM Medical Record Number: [id number] Patient Account Number: [id number] Date of Birth/Sex: 06/05/1966 (52 y.o. M) Treating RN: Curtis Sites Primary Care Provider: Birdie Sons, ERIC Other Clinician: Referring Provider: Birdie Sons, ERIC Treating Provider/Extender: Linwood Dibbles, Arryanna Holquin Weeks in Treatment: 4 History of Present Illness HPI Description: 08/09/18 on evaluation today patient presents for initial evaluation and clinic as referral concerning an ulcer that he has on the left anterior lower Trinity which has been present for five months. He states that he actually noticed this after having an "insect bite" while driving down the road. He states that he did not see what kind of insect it was but he felt it wrong up his leg and then have the bite. Since that time is been having issues with the fact that this area does not want to heal. He does have a history of asthma as well as anemia is most recent CBC revealed a hemoglobin of 11.1 with a hematocrit of 34.6. He does have a history of panic attacks as well intermittently. No fevers, chills, nausea, or vomiting noted at this time. Specifically the patient does not appear to have any infection in regard to the left lower extremity. He's not on  the current antibiotics. 08/16/18 upon evaluation today patient actually appears to be doing significantly better in regard to his wound. He has been tolerating the dressing changes that he was performing on his own over the last week. Subsequently we did not wrap his leg at that time although we discussed it. We weren't sure that he was [place]  be able to get his pants on top of the dressing. Nonetheless the patient fortunately came today with shorts on he states that he is willing to be wrapped if it will help the wound to heal faster. He is pleased with the fact that the wound already appears to be doing better it's also not hurting as badly. The last debridement was a little bit more uncomfortable for him unfortunately. Nonetheless he states he's willing to allow me to debride it again today if I do so carefully. 08/24/18 upon evaluation today patient actually appears to be doing rather well in regard to his lower Trinity ulcer on the left. He has been tolerating the dressing changes with Iodoflex including the wrap without complication and the wound [place] into making signs of good progress. Overall I'm very pleased with how things appear at this time. No fevers, chills, nausea, or vomiting noted at this time. 09/03/18 on evaluation today patient actually appears to be doing rather well in regard to his wound. In fact the overall measurement belies the fact that the wound is actually healed quite a bit more than what the measurement would suggest. In fact it's almost half the size that it was during the last evaluation unfortunately a lot of the area where new skin has grown over is in between two areas that had to be clustered together one of the distal portion of the wound has not completely healed up. Nonetheless there is actually quite a bit more healing than what seems to be represented by the wound measurement today. Overall I'm very pleased with the overall progress and how he seems to be  doing with the dressing changes. The wrap seems to be of great benefit for him as well. 09/09/18 upon evaluation today patient actually appears to be doing excellent in regard to his left lotion the ulcer. He has been tolerating the dressing changes without complication. There does not appear to be any evidence of infection overall I think the [facility] Dressing has done excellent for him. I'm very pleased. Electronic Signature(s) Signed: 09/09/2018 4:34:16 PM By: Lenda Kelp PA-C Entered By: Lenda Kelp on 09/09/2018 10:36:25 Johnathan Scott ([id number]) -------------------------------------------------------------------------------- Physical Exam Details Patient Name: Johnathan Scott Date of Service: 09/09/2018 8:15 AM Medical Record Number: [id number] Patient Account Number: [id number] Date of Birth/Sex: 12-10-1965 (51 y.o. M) Treating RN: Curtis Sites Primary Care Provider: Birdie Sons, ERIC Other Clinician: Referring Provider: Birdie Sons, ERIC Treating Provider/Extender: STONE III, Kayci [place] Weeks in Treatment: 4 Constitutional Well-nourished and well-hydrated in no acute distress. Respiratory normal breathing without difficulty. clear to auscultation bilaterally. Cardiovascular regular rate and rhythm with normal S1, S2. Psychiatric this patient is able to make decisions and demonstrates good insight into disease process. Alert and Oriented x 3. pleasant and cooperative. Notes Patient's wound bed currently have minimal slough and biofilm noted on the surface of the wound which did require sharp debridement today although again compared to previous this was extremely little. He tolerated this with no pain post debridement the wound bed appears to be doing much better. He shown signs of excellent improvement. Electronic Signature(s) Signed: 09/09/2018 4:34:16 PM By: Lenda Kelp PA-C Entered By: Lenda Kelp on 09/09/2018 10:36:53 Johnathan Scott  ([id number]) -------------------------------------------------------------------------------- Physician Orders Details Patient Name: Johnathan Scott Date of Service: 09/09/2018 8:15 AM Medical Record Number: [id number] Patient Account Number: [id number] Date of Birth/Sex: [birth year] (52 y.o. M) Treating RN: Curtis Sites Primary Care Provider: Birdie Sons ERIC Other Clinician: Referring Provider: Birdie Sons, ERIC Treating Provider/Extender:  STONE III, Kaytelyn Glore Weeks in Treatment: 4 Verbal / Phone Orders: No Diagnosis Coding ICD-10 Coding Code Description I87.2 Venous insufficiency (chronic) (peripheral) L97.822 Non-pressure chronic ulcer of other part of left lower leg with fat layer exposed D50.9 Iron deficiency anemia, unspecified J45.998 Other asthma Wound Cleansing Wound #1 Left,Anterior Lower Leg o Clean wound with Normal Saline. o May Shower, gently pat wound dry prior to applying new dressing. Anesthetic (add to Medication List) Wound #1 Left,Anterior Lower Leg o Topical Lidocaine 4% cream applied to wound bed prior to debridement (In Clinic Only). Primary Wound Dressing Wound #1 Left,Anterior Lower Leg o Hydrafera Blue Ready Transfer Secondary Dressing Wound #1 Left,Anterior Lower Leg o [place] Dressing Change Frequency Wound #1 Left,Anterior Lower Leg o Change dressing every other day. Follow-up Appointments Wound #1 Left,Anterior Lower Leg o Return Appointment in 2 weeks. Edema Control Wound #1 Left,Anterior Lower Leg o Patient to wear own Velcro compression garment. o Elevate legs to the level of the heart and pump ankles as often as possible Electronic Signature(s) Signed: 09/09/2018 4:34:16 PM By: Buren KosStone III, Tyjanae Bartek PA-C [place], ByarsHOMAS ([id number]) Signed: 09/09/2018 4:40:32 PM By: Curtis Sitesorthy, Joanna Entered By: Curtis Sitesorthy, Joanna on 09/09/2018 08:44:30 Johnathan SextonSHAMBLEY, Kashton  ([id number]) -------------------------------------------------------------------------------- Problem List Details Patient Name: Johnathan SextonSHAMBLEY, Jovoni Date of Service: 09/09/2018 8:15 AM Medical Record Number: [id number] Patient Account Number: [id number] Date of Birth/Sex: 1966-01-21 (52 y.o. M) Treating RN: Curtis Sitesorthy, Joanna Primary Care Provider: Birdie SonsSONNENBERG, ERIC Other Clinician: Referring Provider: Birdie SonsSONNENBERG, ERIC Treating Provider/Extender: Linwood DibblesSTONE III, Jamaya Sleeth Weeks in Treatment: 4 Active Problems ICD-10 Evaluated Encounter Code Description Active Date Today Diagnosis I87.2 Venous insufficiency (chronic) (peripheral) 08/09/2018 No Yes L97.822 Non-pressure chronic ulcer of other part of left lower leg with 08/09/2018 No Yes fat layer exposed D50.9 Iron deficiency anemia, unspecified 08/09/2018 No Yes J45.998 Other asthma 08/09/2018 No Yes Inactive Problems Resolved Problems Electronic Signature(s) Signed: 09/09/2018 4:34:16 PM By: Lenda KelpStone III, Akili Cuda PA-C Entered By: Lenda KelpStone III, Esai Stecklein on 09/09/2018 08:18:19 Johnathan SextonSHAMBLEY, Ketan ([id number]) -------------------------------------------------------------------------------- Progress Note Details Patient Name: Johnathan SextonSHAMBLEY, Samuell Date of Service: 09/09/2018 8:15 AM Medical Record Number: [id number] Patient Account Number: [id number] Date of Birth/Sex: 1966-01-21 (52 y.o. M) Treating RN: Curtis Sitesorthy, Joanna Primary Care Provider: Birdie SonsSONNENBERG, ERIC Other Clinician: Referring Provider: Birdie SonsSONNENBERG, ERIC Treating Provider/Extender: Linwood DibblesSTONE III, Jonathan Corp[place] Weeks in Treatment: 4 Subjective Chief Complaint Information obtained from Patient Left Anterior LE Ulcer History of Present Illness (HPI) 08/09/18 on evaluation today patient presents for initial evaluation and clinic as referral concerning an ulcer that he has on the left anterior lower [place] which has been present for five months. He states that he actually noticed this after having an "insect bite" while driving  down the road. He states that he did not see what kind of insect it was but he felt it wrong up his leg and then have the bite. Since that time is been having issues with the fact that this area does not want to heal. He does have a history of asthma as well as anemia is most recent CBC revealed a hemoglobin of 11.1 with a hematocrit of 34.6. He does have a history of panic attacks as well intermittently. No fevers, chills, nausea, or vomiting noted at this time. Specifically the patient does not appear to have any infection in regard to the left lower extremity. He's not on the current antibiotics. 08/16/18 upon evaluation today patient actually appears to be doing significantly better in regard to his wound. He has been tolerating the dressing changes that he was performing on  his own over the last week. Subsequently we did not wrap his leg at that time although we discussed it. We weren't sure that he was gonna be able to get his pants on top of the dressing. Nonetheless the patient fortunately came today with shorts on he states that he is willing to be wrapped if it will help the wound to heal faster. He is pleased with the fact that the wound already appears to be doing better it's also not hurting as badly. The last debridement was a little bit more uncomfortable for him unfortunately. Nonetheless he states he's willing to allow me to debride it again today if I do so carefully. 08/24/18 upon evaluation today patient actually appears to be doing rather well in regard to his lower Trinity ulcer on the left. He has been tolerating the dressing changes with Iodoflex including the wrap without complication and the wound [place] into making signs of good progress. Overall I'm very pleased with how things appear at this time. No fevers, chills, nausea, or vomiting noted at this time. 09/03/18 on evaluation today patient actually appears to be doing rather well in regard to his wound. In fact the  overall measurement belies the fact that the wound is actually healed quite a bit more than what the measurement would suggest. In fact it's almost half the size that it was during the last evaluation unfortunately a lot of the area where new skin has grown over is in between two areas that had to be clustered together one of the distal portion of the wound has not completely healed up. Nonetheless there is actually quite a bit more healing than what seems to be represented by the wound measurement today. Overall I'm very pleased with the overall progress and how he seems to be doing with the dressing changes. The wrap seems to be of great benefit for him as well. 09/09/18 upon evaluation today patient actually appears to be doing excellent in regard to his left lotion the ulcer. He has been tolerating the dressing changes without complication. There does not appear to be any evidence of infection overall I think the [facility] Dressing has done excellent for him. I'm very pleased. Patient History Information obtained from Patient. Family History Cancer - Mother,Father,Siblings, Diabetes - Siblings, Heart Disease - Siblings, Hypertension - Siblings, No family history of Hereditary Spherocytosis, Kidney Disease, Lung Disease, Seizures, Stroke, Thyroid Problems, Tuberculosis. RANSOME, HELWIG ([id number]) Social History Former smoker - 7 years, Marital Status - Single, Alcohol Use - Rarely, Drug Use - No History, Caffeine Use - Never. Review of Systems (ROS) Constitutional Symptoms (General Health) Denies complaints or symptoms of Fever, Chills. Respiratory The patient has no complaints or symptoms. Cardiovascular Complains or has symptoms of LE edema. Psychiatric The patient has no complaints or symptoms. Objective Constitutional Well-nourished and well-hydrated in no acute distress. Vitals Time Taken: 8:05 AM, Height: 61 in, Weight: 232 lbs, BMI: 43.8, Temperature: 97.7 F, Pulse:  63 bpm, Respiratory Rate: 16 breaths/min, Blood Pressure: 141/76 mmHg. Respiratory normal breathing without difficulty. clear to auscultation bilaterally. Cardiovascular regular rate and rhythm with normal S1, S2. Psychiatric this patient is able to make decisions and demonstrates good insight into disease process. Alert and Oriented x 3. pleasant and cooperative. General Notes: Patient's wound bed currently have minimal slough and biofilm noted on the surface of the wound which did require sharp debridement today although again compared to previous this was extremely little. He tolerated this with no pain post debridement  the wound bed appears to be doing much better. He shown signs of excellent improvement. Integumentary (Hair, Skin) Wound #1 status is Open. Original cause of wound was Gradually Appeared. The wound is located on the Left,Anterior Lower Leg. The wound measures 2.1cm length x 2cm width x 0.1cm depth; 3.299cm^2 area and 0.33cm^3 volume. There is Fat Layer (Subcutaneous Tissue) Exposed exposed. There is no tunneling or undermining noted. There is a large amount of serous drainage noted. The wound margin is flat and intact. There is small (1-33%) red, hyper - granulation within the wound bed. There is a medium (34-66%) amount of necrotic tissue within the wound bed including Eschar and Adherent Slough. The periwound skin appearance exhibited: Hemosiderin Staining, Rubor. The periwound skin appearance did not exhibit: Callus, Crepitus, Excoriation, Induration, Rash, Scarring, Dry/Scaly, Maceration, Atrophie Blanche, Cyanosis, Ecchymosis, Mottled, Pallor, Erythema. Periwound temperature was noted as No Abnormality. The periwound has tenderness on palpation. CARA, AGUINO ([id number]) Assessment Active Problems ICD-10 Venous insufficiency (chronic) (peripheral) Non-pressure chronic ulcer of other part of left lower leg with fat layer exposed Iron deficiency anemia,  unspecified Other asthma Procedures Wound #1 Pre-procedure diagnosis of Wound #1 is a Venous Leg Ulcer located on the Left,Anterior Lower Leg .Severity of Tissue Pre Debridement is: Fat layer exposed. There was a Excisional Skin/Subcutaneous Tissue Debridement with a total area of 4.2 sq cm performed by STONE III, Caylen Yardley E., PA-C. With the following instrument(s): Curette Material removed includes Subcutaneous Tissue, Slough, and Biofilm after achieving pain control using Other (benzocaine 20%). No specimens were taken. A time out was conducted at 08:40, prior to the start of the procedure. A Minimum amount of bleeding was controlled with Pressure. The procedure was tolerated well with a pain level of 0 throughout and a pain level of 0 following the procedure. Post Debridement Measurements: 2.1cm length x 2cm width x 0.1cm depth; 0.33cm^3 volume. Character of Wound/Ulcer Post Debridement is improved. Severity of Tissue Post Debridement is: Fat layer exposed. Post procedure Diagnosis Wound #1: Same as Pre-Procedure Plan Wound Cleansing: Wound #1 Left,Anterior Lower Leg: Clean wound with Normal Saline. May Shower, gently pat wound dry prior to applying new dressing. Anesthetic (add to Medication List): Wound #1 Left,Anterior Lower Leg: Topical Lidocaine 4% cream applied to wound bed prior to debridement (In Clinic Only). Primary Wound Dressing: Wound #1 Left,Anterior Lower Leg: Hydrafera Blue Ready Transfer Secondary Dressing: Wound #1 Left,Anterior Lower Leg: [place] Dressing Change Frequency: Wound #1 Left,Anterior Lower Leg: Change dressing every other day. Follow-up Appointments: Wound #1 Left,Anterior Lower Leg: Return Appointment in 2 weeks. Edema Control: Wound #1 Left,Anterior Lower Leg: Patient to wear own Velcro compression garment. Elevate legs to the level of the heart and pump ankles as often as possible Licausi, Booker ([id number]) I'm gonna recommend that we  continue with the above wound care measures for the next two weeks. Time will see him on a rotating two week schedule he uses Velcro compression stocking in the meantime. Please see above for specific wound care orders. We will see patient for re-evaluation in 1 week(s) here in the clinic. If anything worsens or changes patient will contact our office for additional recommendations. Electronic Signature(s) Signed: 09/09/2018 4:34:16 PM By: Lenda Kelp PA-C Entered By: Lenda Kelp on 09/09/2018 10:37:10 Johnathan Scott ([id number]) -------------------------------------------------------------------------------- ROS/PFSH Details Patient Name: Johnathan Scott Date of Service: 09/09/2018 8:15 AM Medical Record Number: [id number] Patient Account Number: [id number] Date of Birth/Sex: [birth year] (52 y.o. M) Treating RN: Curtis Sites Primary Care Provider:  SONNENBERG, ERIC Other Clinician: Referring Provider: Birdie Sons, ERIC Treating Provider/Extender: STONE III, Cordero Surette Weeks in Treatment: 4 Information Obtained From Patient Wound History Do you currently have one or more open woundso Yes How many open wounds do you currently haveo 1 Approximately how long have you had your woundso 5 months How have you been treating your wound(s) until nowo open to air Has your wound(s) ever healed and then re-openedo No Have you had any lab work done in the past montho No Have you tested positive for an antibiotic resistant organism (MRSA, VRE)o No Have you tested positive for osteomyelitis (bone infection)o No Have you had any tests for circulation on your legso No Have you had other problems associated with your woundso Swelling Constitutional Symptoms (General Health) Complaints and Symptoms: Negative for: Fever; Chills Cardiovascular Complaints and Symptoms: Positive for: LE edema Medical History: Negative for: Angina; Arrhythmia; Congestive Heart Failure; Coronary Artery Disease; Deep  Vein Thrombosis; Hypertension; Hypotension; Myocardial Infarction; Peripheral Arterial Disease; Peripheral Venous Disease; Phlebitis; Vasculitis Eyes Medical History: Negative for: Cataracts; Glaucoma; Optic Neuritis Ear/Nose/Mouth/Throat Medical History: Negative for: Chronic sinus problems/congestion; Middle ear problems Hematologic/Lymphatic Medical History: Negative for: Anemia; Hemophilia; Human Immunodeficiency Virus; Lymphedema; Sickle Cell Disease Respiratory Complaints and Symptoms: No Complaints or Symptoms Medical HistoryZEPPELIN, COMMISSO ([id number]) Positive for: Asthma - history as a child Negative for: Aspiration; Chronic Obstructive Pulmonary Disease (COPD); Pneumothorax; Sleep Apnea; Tuberculosis Gastrointestinal Medical History: Negative for: Cirrhosis ; Colitis; Crohnos; Hepatitis A; Hepatitis B; Hepatitis C Endocrine Medical History: Negative for: Type I Diabetes; Type II Diabetes Genitourinary Medical History: Negative for: End Stage Renal Disease Immunological Medical History: Negative for: Lupus Erythematosus; Raynaudos; Scleroderma Integumentary (Skin) Medical History: Negative for: History of Burn; History of pressure wounds Musculoskeletal Medical History: Positive for: Osteoarthritis - both hips Negative for: Gout; Rheumatoid Arthritis; Osteomyelitis Neurologic Medical History: Positive for: Seizure Disorder - as a child Negative for: Dementia; Neuropathy; Quadriplegia; Paraplegia Oncologic Medical History: Negative for: Received Chemotherapy; Received Radiation Psychiatric Complaints and Symptoms: No Complaints or Symptoms Medical History: Negative for: Anorexia/bulimia; Confinement Anxiety Immunizations Pneumococcal Vaccine: Received Pneumococcal Vaccination: No Implantable Devices DAKOTA, VANWART ([id number]) Family and Social History Cancer: Yes - Mother,Father,Siblings; Diabetes: Yes - Siblings; Heart Disease: Yes - Siblings;  Hereditary Spherocytosis: No; Hypertension: Yes - Siblings; Kidney Disease: No; Lung Disease: No; Seizures: No; Stroke: No; Thyroid Problems: No; Tuberculosis: No; Former smoker - 7 years; Marital Status - Single; Alcohol Use: Rarely; Drug Use: No History; Caffeine Use: Never; Financial Concerns: No; Food, Clothing or Shelter Needs: No; Support System Lacking: No; Transportation Concerns: No; Advanced Directives: No; Patient does not want information on Advanced Directives Physician Affirmation I have reviewed and agree with the above information. Electronic Signature(s) Signed: 09/09/2018 4:34:16 PM By: Lenda Kelp PA-C Signed: 09/09/2018 4:40:32 PM By: Curtis Sites Entered By: Lenda Kelp on 09/09/2018 10:36:40 Johnathan Scott ([id number]) -------------------------------------------------------------------------------- SuperBill Details Patient Name: Johnathan Scott Date of Service: 09/09/2018 Medical Record Number: [id number] Patient Account Number: [id number] Date of Birth/Sex: January 03, 1966 (52 y.o. M) Treating RN: Curtis Sites Primary Care Provider: Birdie Sons, ERIC Other Clinician: Referring Provider: Birdie Sons, ERIC Treating Provider/Extender: Linwood Dibbles, Zyair Rhein Weeks in Treatment: 4 Diagnosis Coding ICD-10 Codes Code Description I87.2 Venous insufficiency (chronic) (peripheral) L97.822 Non-pressure chronic ulcer of other part of left lower leg with fat layer exposed D50.9 Iron deficiency anemia, unspecified J45.998 Other asthma Facility Procedures CPT4 Code Description: 24401027 11042 - DEB SUBQ TISSUE 20 SQ CM/< ICD-10 Diagnosis Description L97.822 Non-pressure chronic ulcer of other part  of left lower leg with Modifier: fat layer expos Quantity: 1 ed Physician Procedures CPT4 Code Description: 1610960 11042 - WC PHYS SUBQ TISS 20 SQ CM ICD-10 Diagnosis Description L97.822 Non-pressure chronic ulcer of other part of left lower leg with Modifier: fat layer  expos Quantity: 1 ed Electronic Signature(s) Signed: 09/09/2018 4:34:16 PM By: Lenda Kelp PA-C Entered By: Lenda Kelp on 09/09/2018 10:37:19

## 2018-09-14 ENCOUNTER — Telehealth: Payer: Self-pay | Admitting: *Deleted

## 2018-09-14 NOTE — Progress Notes (Signed)
Left message for patient to return call to office. 

## 2018-09-15 NOTE — Telephone Encounter (Signed)
Patient returning call from [place].

## 2018-09-20 ENCOUNTER — Other Ambulatory Visit: Payer: Self-pay | Admitting: *Deleted

## 2018-09-20 DIAGNOSIS — I10 Essential (primary) hypertension: Secondary | ICD-10-CM

## 2018-09-20 MED ORDER — HYDROCHLOROTHIAZIDE 25 MG PO TABS: 25.0000 mg | ORAL_TABLET | Freq: Every day | ORAL | 1 refills | Status: DC

## 2018-09-20 NOTE — Telephone Encounter (Signed)
See [place] note 

## 2018-09-20 NOTE — Progress Notes (Signed)
Scheduled nurse visit BP recheck in one month and sent script for HCTZ 25 mg 1 tablet daily.

## 2018-09-23 ENCOUNTER — Encounter: Payer: 59 | Attending: Physician Assistant | Admitting: Physician Assistant

## 2018-09-23 DIAGNOSIS — I872 Venous insufficiency (chronic) (peripheral): Secondary | ICD-10-CM | POA: Insufficient documentation

## 2018-09-23 DIAGNOSIS — L97822 Non-pressure chronic ulcer of other part of left lower leg with fat layer exposed: Secondary | ICD-10-CM | POA: Insufficient documentation

## 2018-09-23 DIAGNOSIS — D509 Iron deficiency anemia, unspecified: Secondary | ICD-10-CM | POA: Insufficient documentation

## 2018-09-23 DIAGNOSIS — J45998 Other asthma: Secondary | ICD-10-CM | POA: Diagnosis not present

## 2018-09-23 DIAGNOSIS — L97422 Non-pressure chronic ulcer of left heel and midfoot with fat layer exposed: Secondary | ICD-10-CM | POA: Diagnosis present

## 2018-09-26 NOTE — Progress Notes (Signed)
EZEKIEL, MENZER ([id number]) Visit Report for 09/23/2018 Allergy List Details Patient Name: Johnathan Scott, Johnathan Scott Date of Service: 09/23/2018 3:15 PM Medical Record Number: [id number] Patient Account Number: [id number] Date of Birth/Sex: 07-03-1966 (52 y.o. M) Treating RN: Curtis Sites Primary Care Ashante Yellin: Birdie Sons, ERIC Other Clinician: Referring Kiyoko Mcguirt: Birdie Sons, ERIC Treating Kc Summerson/Extender: STONE III, HOYT Weeks in Treatment: 6 Allergies Active Allergies Novocain cortisone Allergy Notes Electronic Signature(s) Signed: 09/23/2018 5:28:33 PM By: Curtis Sites Entered By: Curtis Sites on 09/23/2018 15:55:40 Johnathan Scott ([id number]) -------------------------------------------------------------------------------- Arrival Information Details Patient Name: Johnathan Scott Date of Service: 09/23/2018 3:15 PM Medical Record Number: [id number] Patient Account Number: [id number] Date of Birth/Sex: [birth year] (52 y.o. M) Treating RN: Huel Coventry Primary Care Jiselle Sheu: Birdie Sons, ERIC Other Clinician: Referring Maevis Mumby: Birdie Sons, ERIC Treating Denilson Salminen/Extender: Linwood Dibbles, HOYT Weeks in Treatment: 6 Visit Information Patient Arrived: Cane Arrival Time: 15:22 Accompanied By: self Transfer Assistance: None Patient Identification Verified: Yes Secondary Verification Process Yes Completed: Patient Has Alerts: Yes Patient Alerts: PT REFUSED FACE PHOTO History Since Last Visit Added or deleted any medications: No Any new allergies or adverse reactions: No Had a fall or experienced change in activities of daily living that may affect risk of falls: No Signs or symptoms of abuse/neglect since last visito No Hospitalized since last visit: No Implantable device outside of the clinic excluding cellular tissue based products placed in the center since last visit: No Has Dressing in Place as Prescribed: Yes Electronic Signature(s) Signed: 09/23/2018 5:33:18 PM By: Elliot Gurney, BSN,  RN, CWS, Kim RN, BSN Entered By: Elliot Gurney, BSN, RN, CWS, Kim on 09/23/2018 15:23:05 Johnathan Scott ([id number]) -------------------------------------------------------------------------------- Clinic Level of Care Assessment Details Patient Name: Johnathan Scott Date of Service: 09/23/2018 3:15 PM Medical Record Number: [id number] Patient Account Number: [id number] Date of Birth/Sex: [birth year] (52 y.o. M) Treating RN: Curtis Sites Primary Care Marcquis Ridlon: Birdie Sons, ERIC Other Clinician: Referring Meagan Spease: Birdie Sons, ERIC Treating Eldwin Volkov/Extender: Linwood Dibbles, HOYT Weeks in Treatment: 6 Clinic Level of Care Assessment Items TOOL 4 Quantity Score []  - Use when only an EandM is performed on FOLLOW-UP visit 0 ASSESSMENTS - Nursing Assessment / Reassessment X - Reassessment of Co-morbidities (includes updates in patient status) 1 10 X- 1 5 Reassessment of Adherence to Treatment Plan ASSESSMENTS - Wound and Skin Assessment / Reassessment X - Simple Wound Assessment / Reassessment - one wound 1 5 []  - 0 Complex Wound Assessment / Reassessment - multiple wounds []  - 0 Dermatologic / Skin Assessment (not related to wound area) ASSESSMENTS - Focused Assessment X - Circumferential Edema Measurements - multi extremities 1 5 []  - 0 Nutritional Assessment / Counseling / Intervention X- 1 5 Lower Extremity Assessment (monofilament, tuning fork, pulses) []  - 0 Peripheral Arterial Disease Assessment (using hand held doppler) ASSESSMENTS - Ostomy and/or Continence Assessment and Care []  - Incontinence Assessment and Management 0 []  - 0 Ostomy Care Assessment and Management (repouching, etc.) PROCESS - Coordination of Care X - Simple Patient / Family Education for ongoing care 1 15 []  - 0 Complex (extensive) Patient / Family Education for ongoing care []  - 0 Staff obtains Chiropractor, Records, Test Results / Process Orders []  - 0 Staff telephones HHA, Nursing Homes / Clarify orders / etc []   - 0 Routine Transfer to another Facility (non-emergent condition) []  - 0 Routine Hospital Admission (non-emergent condition) []  - 0 New Admissions / Manufacturing engineer / Ordering NPWT, Apligraf, etc. []  - 0 Emergency Hospital Admission (emergent condition) X- 1 10 Simple Discharge Coordination [place], [place] ([id number]) []  - 0 Complex (extensive)  Discharge Coordination PROCESS - Special Needs []  - Pediatric / Minor Patient Management 0 []  - 0 Isolation Patient Management []  - 0 Hearing / Language / Visual special needs []  - 0 Assessment of Community assistance (transportation, D/C planning, etc.) []  - 0 Additional assistance / Altered mentation []  - 0 Support Surface(s) Assessment (bed, cushion, seat, etc.) INTERVENTIONS - Wound Cleansing / Measurement X - Simple Wound Cleansing - one wound 1 5 []  - 0 Complex Wound Cleansing - multiple wounds X- 1 5 Wound Imaging (photographs - any number of wounds) []  - 0 Wound Tracing (instead of photographs) X- 1 5 Simple Wound Measurement - one wound []  - 0 Complex Wound Measurement - multiple wounds INTERVENTIONS - Wound Dressings []  - Small Wound Dressing one or multiple wounds 0 X- 1 15 Medium Wound Dressing one or multiple wounds []  - 0 Large Wound Dressing one or multiple wounds []  - 0 Application of Medications - topical []  - 0 Application of Medications - injection INTERVENTIONS - Miscellaneous []  - External ear exam 0 X- 1 5 Specimen Collection (cultures, biopsies, blood, body fluids, etc.) []  - 0 Specimen(s) / Culture(s) sent or taken to Lab for analysis []  - 0 Patient Transfer (multiple staff / Nurse, adult / Similar devices) []  - 0 Simple Staple / Suture removal (25 or less) []  - 0 Complex Staple / Suture removal (26 or more) []  - 0 Hypo / Hyperglycemic Management (close monitor of Blood Glucose) []  - 0 Ankle / Brachial Index (ABI) - do not check if billed separately X- 1 5 Vital Signs Johnathan Scott,  Johnathan Scott ([id number]) Has the patient been seen at the hospital within the last three years: Yes Total Score: 95 Level Of Care: New/Established - Level 3 Electronic Signature(s) Signed: 09/23/2018 5:28:33 PM By: Curtis Sites Entered By: Curtis Sites on 09/23/2018 16:15:58 Johnathan Scott ([id number]) -------------------------------------------------------------------------------- Lower Extremity Assessment Details Patient Name: Johnathan Scott Date of Service: 09/23/2018 3:15 PM Medical Record Number: [id number] Patient Account Number: [id number] Date of Birth/Sex: 28-Jun-1966 (52 y.o. M) Treating RN: Huel Coventry Primary Care Sherece Gambrill: Birdie Sons, ERIC Other Clinician: Referring Nandika Stetzer: Birdie Sons, ERIC Treating Culver Feighner/Extender: Linwood Dibbles, HOYT Weeks in Treatment: 6 Edema Assessment Assessed: [Left: No] [Right: No] [Left: Edema] [Right: :] Calf Left: Right: Point of Measurement: 34 cm From Medial Instep 52.5 cm cm Ankle Left: Right: Point of Measurement: 10 cm From Medial Instep 28.3 cm cm Vascular Assessment Claudication: Claudication Assessment [Left:None] Pulses: Dorsalis Pedis Palpable: [Left:Yes] Posterior Tibial Palpable: [Left:Yes] Extremity colors, hair growth, and conditions: Hair Growth on Extremity: [Left:No] Temperature of Extremity: [Left:Warm] Capillary Refill: [Left:< 3 seconds] Toe Nail Assessment Left: Right: Thick: Yes Discolored: Yes Deformed: Yes Improper Length and Hygiene: Yes Electronic Signature(s) Signed: 09/23/2018 5:33:18 PM By: Elliot Gurney, BSN, RN, CWS, Kim RN, BSN Entered By: Elliot Gurney, BSN, RN, CWS, Kim on 09/23/2018 15:39:11 Johnathan Scott ([id number]) -------------------------------------------------------------------------------- Multi Wound Chart Details Patient Name: Johnathan Scott Date of Service: 09/23/2018 3:15 PM Medical Record Number: [id number] Patient Account Number: [id number] Date of Birth/Sex: Nov 11, 1965 (52 y.o. M) Treating  RN: Curtis Sites Primary Care Teran Knittle: Birdie Sons, ERIC Other Clinician: Referring Adylene Dlugosz: Birdie Sons, ERIC Treating Dorothy Landgrebe/Extender: Linwood Dibbles, HOYT Weeks in Treatment: 6 Vital Signs Height(in): 61 Pulse(bpm): 72 Weight(lbs): 232 Blood Pressure(mmHg): 152/77 Body Mass Index(BMI): 44 Temperature(F): 97.7 Respiratory Rate 16 (breaths/min): Photos: [1:No Photos] [N/A:N/A] Wound Location: [1:Left Lower Leg - Anterior] [N/A:N/A] Wounding Event: [1:Gradually Appeared] [N/A:N/A] Primary Etiology: [1:Venous Leg Ulcer] [N/A:N/A] Comorbid History: [1:Asthma, Osteoarthritis, Seizure Disorder] [N/A:N/A] Date Acquired: [1:03/08/2018] [N/A:N/A] Weeks  of Treatment: [1:6] [N/A:N/A] Wound Status: [1:Open] [N/A:N/A] Measurements L x W x D [1:2.3x2.1x0.1] [N/A:N/A] (cm) Area (cm) : [1:3.793] [N/A:N/A] Volume (cm) : [1:0.379] [N/A:N/A] % Reduction in Area: [1:61.70%] [N/A:N/A] % Reduction in Volume: [1:80.80%] [N/A:N/A] Classification: [1:Full Thickness Without Exposed Support Structures] [N/A:N/A] Exudate Amount: [1:Large] [N/A:N/A] Exudate Type: [1:Serous] [N/A:N/A] Exudate Color: [1:amber] [N/A:N/A] Wound Margin: [1:Flat and Intact] [N/A:N/A] Granulation Amount: [1:Small (1-33%)] [N/A:N/A] Granulation Quality: [1:Red, Hyper-granulation] [N/A:N/A] Necrotic Amount: [1:Medium (34-66%)] [N/A:N/A] Necrotic Tissue: [1:Eschar, Adherent Slough] [N/A:N/A] Exposed Structures: [1:Fat Layer (Subcutaneous Tissue) Exposed: Yes Fascia: No Tendon: No Muscle: No Joint: No Bone: No] [N/A:N/A] Epithelialization: [1:Small (1-33%)] [N/A:N/A] Periwound Skin Texture: [1:Excoriation: Yes Induration: No Callus: No Crepitus: No] [N/A:N/A] Rash: No Scarring: No Periwound Skin Moisture: Maceration: No N/A N/A Dry/Scaly: No Periwound Skin Color: Erythema: Yes N/A N/A Hemosiderin Staining: Yes Rubor: Yes Atrophie Blanche: No Cyanosis: No Ecchymosis: No Mottled: No Pallor: No Erythema Location:  Circumferential N/A N/A Erythema Measurement: Measured: 7cm N/A N/A Temperature: No Abnormality N/A N/A Tenderness on Palpation: Yes N/A N/A Wound Preparation: Ulcer Cleansing: Other: soap N/A N/A and water Topical Anesthetic Applied: Other: lidocaine 4% Treatment Notes Electronic Signature(s) Signed: 09/23/2018 5:28:33 PM By: Curtis Sites Entered By: Curtis Sites on 09/23/2018 15:53:26 Johnathan Scott ([id number]) -------------------------------------------------------------------------------- Multi-Disciplinary Care Plan Details Patient Name: Johnathan Scott Date of Service: 09/23/2018 3:15 PM Medical Record Number: [id number] Patient Account Number: [id number] Date of Birth/Sex: 12-16-1965 (52 y.o. M) Treating RN: Curtis Sites Primary Care Amnah Breuer: Birdie Sons, ERIC Other Clinician: Referring Sarahanne Novakowski: Birdie Sons, ERIC Treating Britton Perkinson/Extender: Linwood Dibbles, HOYT Weeks in Treatment: 6 Active Inactive Necrotic Tissue Nursing Diagnoses: Knowledge deficit related to management of necrotic/devitalized tissue Goals: Necrotic/devitalized tissue will be minimized in the wound bed Date Initiated: 08/09/2018 Target Resolution Date: 09/09/2018 Goal Status: Active Interventions: Assess patient pain level pre-, during and post procedure and prior to discharge Treatment Activities: Apply topical anesthetic as ordered : 08/09/2018 Excisional debridement : 08/09/2018 Notes: Orientation to the Wound Care Program Nursing Diagnoses: Knowledge deficit related to the wound healing center program Goals: Patient/caregiver will verbalize understanding of the Wound Healing Center Program Date Initiated: 08/09/2018 Target Resolution Date: 09/09/2018 Goal Status: Active Interventions: Provide education on orientation to the wound center Notes: Wound/Skin Impairment Nursing Diagnoses: Impaired tissue integrity Knowledge deficit related to smoking impact on wound  healing Goals: Ulcer/skin breakdown will have a volume reduction of 30% by week 4 Johnathan Scott, Johnathan Scott ([id number]) Date Initiated: 08/09/2018 Target Resolution Date: 09/09/2018 Goal Status: Active Interventions: Assess patient/caregiver ability to obtain necessary supplies Assess ulceration(s) every visit Treatment Activities: Topical wound management initiated : 08/09/2018 Notes: Electronic Signature(s) Signed: 09/23/2018 5:28:33 PM By: Curtis Sites Entered By: Curtis Sites on 09/23/2018 15:53:16 Johnathan Scott ([id number]) -------------------------------------------------------------------------------- Pain Assessment Details Patient Name: Johnathan Scott Date of Service: 09/23/2018 3:15 PM Medical Record Number: [id number] Patient Account Number: [id number] Date of Birth/Sex: 05-07-1966 (52 y.o. M) Treating RN: Huel Coventry Primary Care Remmie Bembenek: Birdie Sons, ERIC Other Clinician: Referring Betzabe Bevans: Birdie Sons, ERIC Treating Seibert Keeter/Extender: Linwood Dibbles, HOYT Weeks in Treatment: 6 Active Problems Location of Pain Severity and Description of Pain Patient Has Paino No Site Locations Pain Management and Medication Current Pain Management: Electronic Signature(s) Signed: 09/23/2018 5:33:18 PM By: Elliot Gurney, BSN, RN, CWS, Kim RN, BSN Entered By: Elliot Gurney, BSN, RN, CWS, Kim on 09/23/2018 15:23:12 Johnathan Scott ([id number]) -------------------------------------------------------------------------------- Patient/Caregiver Education Details Patient Name: Johnathan Scott Date of Service: 09/23/2018 3:15 PM Medical Record Number: [id number] Patient Account Number: [id number] Date of Birth/Gender: [birth year] (52 y.o. M) Treating RN: Curtis Sites  Primary Care Physician: Birdie SonsSONNENBERG, ERIC Other Clinician: Referring Physician: Birdie SonsSONNENBERG, ERIC Treating Physician/Extender: Skeet SimmerSTONE III, HOYT Weeks in Treatment: 6 Education Assessment Education Provided To: Patient Education Topics  Provided Wound/Skin Impairment: Handouts: Other: daily wound care as ordered Methods: Demonstration, Explain/Verbal Responses: State content correctly Electronic Signature(s) Signed: 09/23/2018 5:28:33 PM By: Curtis Sitesorthy, Joanna Entered By: Curtis Sitesorthy, Joanna on 09/23/2018 16:16:37 Johnathan Scott, Johnathan Scott ([id number]) -------------------------------------------------------------------------------- Wound Assessment Details Patient Name: Johnathan Scott, Johnathan Scott Date of Service: 09/23/2018 3:15 PM Medical Record Number: [id number] Patient Account Number: [id number] Date of Birth/Sex: 07-04-1966 (52 y.o. M) Treating RN: Huel CoventryWoody, Kim Primary Care Tata Timmins: Birdie SonsSONNENBERG, ERIC Other Clinician: Referring Embree Brawley: Birdie SonsSONNENBERG, ERIC Treating Minka Knight/Extender: Linwood DibblesSTONE III, HOYT Weeks in Treatment: 6 Wound Status Wound Number: 1 Primary Etiology: Venous Leg Ulcer Wound Location: Left Lower Leg - Anterior Wound Status: Open Wounding Event: Gradually Appeared Comorbid History: Asthma, Osteoarthritis, Seizure Disorder Date Acquired: 03/08/2018 Weeks Of Treatment: 6 Clustered Wound: No Photos Photo Uploaded By: Rema JasmineNg, Wendi on 09/24/2018 09:00:23 Wound Measurements Length: (cm) 2.3 Width: (cm) 2.1 Depth: (cm) 0.1 Area: (cm) 3.793 Volume: (cm) 0.379 % Reduction in Area: 61.7% % Reduction in Volume: 80.8% Epithelialization: Small (1-33%) Wound Description Full Thickness Without Exposed Support Foul Odo Classification: Structures Slough/F Wound Margin: Flat and Intact Exudate Large Amount: Exudate Type: Serous Exudate Color: amber r After Cleansing: No ibrino Yes Wound Bed Granulation Amount: Small (1-33%) Exposed Structure Granulation Quality: Red, Hyper-granulation Fascia Exposed: No Necrotic Amount: Medium (34-66%) Fat Layer (Subcutaneous Tissue) Exposed: Yes Necrotic Quality: Eschar, Adherent Slough Tendon Exposed: No Muscle Exposed: No Joint Exposed: No Bone Exposed: No Domke, Kailyn  ([id number]) Periwound Skin Texture Texture Color No Abnormalities Noted: No No Abnormalities Noted: No Callus: No Atrophie Blanche: No Crepitus: No Cyanosis: No Excoriation: Yes Ecchymosis: No Induration: No Erythema: Yes Rash: No Erythema Location: Circumferential Scarring: No Erythema Measurement: Measured 7 cm Moisture Hemosiderin Staining: Yes No Abnormalities Noted: No Mottled: No Dry / Scaly: No Pallor: No Maceration: No Rubor: Yes Temperature / Pain Temperature: No Abnormality Tenderness on Palpation: Yes Wound Preparation Ulcer Cleansing: Other: soap and water, Topical Anesthetic Applied: Other: lidocaine 4%, Electronic Signature(s) Signed: 09/23/2018 5:33:18 PM By: Elliot GurneyWoody, BSN, RN, CWS, Kim RN, BSN Entered By: Elliot GurneyWoody, BSN, RN, CWS, Kim on 09/23/2018 15:35:25 Johnathan Scott, Johnathan Scott ([id number]) -------------------------------------------------------------------------------- Vitals Details Patient Name: Johnathan Scott, Johnathan Scott Date of Service: 09/23/2018 3:15 PM Medical Record Number: [id number] Patient Account Number: [id number] Date of Birth/Sex: 07-04-1966 (52 y.o. M) Treating RN: Huel CoventryWoody, Kim Primary Care Demetric Dunnaway: Birdie SonsSONNENBERG, ERIC Other Clinician: Referring Lunabella Badgett: Birdie SonsSONNENBERG, ERIC Treating Adham Johnson/Extender: Linwood DibblesSTONE III, HOYT Weeks in Treatment: 6 Vital Signs Time Taken: 15:26 Temperature (F): 97.7 Height (in): 61 Pulse (bpm): 72 Weight (lbs): 232 Respiratory Rate (breaths/min): 16 Body Mass Index (BMI): 43.8 Blood Pressure (mmHg): 152/77 Reference Range: 80 - 120 mg / dl Electronic Signature(s) Signed: 09/23/2018 5:33:18 PM By: Elliot GurneyWoody, BSN, RN, CWS, Kim RN, BSN Entered By: Elliot GurneyWoody, BSN, RN, CWS, Kim on 09/23/2018 15:27:23

## 2018-09-26 NOTE — Progress Notes (Addendum)
Johnathan, Scott ([id number]) Visit Report for 09/23/2018 Chief Complaint Document Details Patient Name: Johnathan Scott, Johnathan Scott Date of Service: 09/23/2018 3:15 PM Medical Record Number: [id number] Patient Account Number: [id number] Date of Birth/Sex: 07-21-1966 (52 y.o. Male) Treating RN: Huel Coventry Primary Care Provider: Birdie Sons, ERIC Other Clinician: Referring Provider: Birdie Sons, ERIC Treating Provider/Extender: Lenda Kelp Weeks in Treatment: 6 Information Obtained from: Patient Chief Complaint Left Anterior LE Ulcer Electronic Signature(s) Signed: 09/23/2018 9:49:28 PM By: Lenda Kelp PA-C Entered By: Lenda Kelp on 09/23/2018 15:14:23 Johnathan Scott ([id number]) -------------------------------------------------------------------------------- HPI Details Patient Name: Johnathan Scott Date of Service: 09/23/2018 3:15 PM Medical Record Number: [id number] Patient Account Number: [id number] Date of Birth/Sex: 08-03-1966 (52 y.o. Male) Treating RN: Huel Coventry Primary Care Provider: Birdie Sons, ERIC Other Clinician: Referring Provider: Birdie Sons, ERIC Treating Provider/Extender: Linwood Dibbles, HOYT Weeks in Treatment: 6 History of Present Illness HPI Description: 08/09/18 on evaluation today patient presents for initial evaluation and clinic as referral concerning an ulcer that he has on the left anterior lower Trinity which has been present for five months. He states that he actually noticed this after having an "insect bite" while driving down the road. He states that he did not see what kind of insect it was but he felt it wrong up his leg and then have the bite. Since that time is been having issues with the fact that this area does not want to heal. He does have a history of asthma as well as anemia is most recent CBC revealed a hemoglobin of 11.1 with a hematocrit of 34.6. He does have a history of panic attacks as well intermittently. No fevers, chills, nausea, or vomiting  noted at this time. Specifically the patient does not appear to have any infection in regard to the left lower extremity. He's not on the current antibiotics. 08/16/18 upon evaluation today patient actually appears to be doing significantly better in regard to his wound. He has been tolerating the dressing changes that he was performing on his own over the last week. Subsequently we did not wrap his leg at that time although we discussed it. We weren't sure that he was gonna be able to get his pants on top of the dressing. Nonetheless the patient fortunately came today with shorts on he states that he is willing to be wrapped if it will help the wound to heal faster. He is pleased with the fact that the wound already appears to be doing better it's also not hurting as badly. The last debridement was a little bit more uncomfortable for him unfortunately. Nonetheless he states he's willing to allow me to debride it again today if I do so carefully. 08/24/18 upon evaluation today patient actually appears to be doing rather well in regard to his lower Trinity ulcer on the left. He has been tolerating the dressing changes with Iodoflex including the wrap without complication and the wound [place] into making signs of good progress. Overall I'm very pleased with how things appear at this time. No fevers, chills, nausea, or vomiting noted at this time. 09/03/18 on evaluation today patient actually appears to be doing rather well in regard to his wound. In fact the overall measurement belies the fact that the wound is actually healed quite a bit more than what the measurement would suggest. In fact it's almost half the size that it was during the last evaluation unfortunately a lot of the area where new skin has grown over is in between two areas that had to  be clustered together one of the distal portion of the wound has not completely healed up. Nonetheless there is actually quite a bit more healing than what  seems to be represented by the wound measurement today. Overall I'm very pleased with the overall progress and how he seems to be doing with the dressing changes. The wrap seems to be of great benefit for him as well. 09/09/18 upon evaluation today patient actually appears to be doing excellent in regard to his left lotion the ulcer. He has been tolerating the dressing changes without complication. There does not appear to be any evidence of infection overall I think the [facility] Dressing has done excellent for him. I'm very pleased. 09/23/18 evaluation today patient actually appears to be doing poorly in regard to his lower extremity ulcer. There's a lot of maceration and skin breakdown around the actual wound itself which is making it difficult to even tell exactly what is going on. This is something he states started about four days ago. Nonetheless I'm concerned about the possibility of infection just being that he was not having near this amount of drainage previous. He also states in the past 24 hours the pain has been a little bit more significant. No fevers, chills, nausea, or vomiting noted at this time. Electronic Signature(s) Signed: 09/28/2018 8:14:55 AM By: Lenda Kelp PA-C Previous Signature: 09/23/2018 9:49:28 PM Version By: Lenda Kelp PA-C Entered By: Lenda Kelp on 09/28/2018 08:13:52 JULEZ, HUSEBY ([id number]) BERTHEL, BAGNALL ([id number]) -------------------------------------------------------------------------------- Physical Exam Details Patient Name: Johnathan Scott Date of Service: 09/23/2018 3:15 PM Medical Record Number: [id number] Patient Account Number: [id number] Date of Birth/Sex: [birth year] (52 y.o. Male) Treating RN: Huel Coventry Primary Care Provider: Birdie Sons, ERIC Other Clinician: Referring Provider: Birdie Sons, ERIC Treating Provider/Extender: STONE III, HOYT Weeks in Treatment: 6 Constitutional Well-nourished and well-hydrated in no  acute distress. Respiratory normal breathing without difficulty. clear to auscultation bilaterally. Cardiovascular regular rate and rhythm with normal S1, S2. 2+ pitting edema of the bilateral lower extremities. Psychiatric this patient is able to make decisions and demonstrates good insight into disease process. Alert and Oriented x 3. pleasant and cooperative. Notes Patient's wound bed currently did show some Slough noted on the surface of the wound although no sharp debridement was performed today simply due to the fact that he is having more discomfort anyway as well as the fact that he potentially has an infection I do not want to spread anything at this point I want to try to get this under control. To that end I'm going to place him on an antibiotic I did perform the wound culture today as well in order to evaluate for what bacteria may be causing the issue and subsequently what we may need to switch the anabiotic to depending on the results of the culture. Electronic Signature(s) Signed: 09/23/2018 9:49:28 PM By: Lenda Kelp PA-C Entered By: Lenda Kelp on 09/23/2018 16:08:24 Johnathan Scott ([id number]) -------------------------------------------------------------------------------- Physician Orders Details Patient Name: Johnathan Scott Date of Service: 09/23/2018 3:15 PM Medical Record Number: [id number] Patient Account Number: [id number] Date of Birth/Sex: 1966/07/15 (52 y.o. Male) Treating RN: Curtis Sites Primary Care Provider: Birdie Sons, ERIC Other Clinician: Referring Provider: Birdie Sons, ERIC Treating Provider/Extender: Linwood Dibbles, HOYT Weeks in Treatment: 6 Verbal / Phone Orders: No Diagnosis Coding ICD-10 Coding Code Description I87.2 Venous insufficiency (chronic) (peripheral) L97.822 Non-pressure chronic ulcer of other part of left lower leg with fat layer exposed D50.9 Iron deficiency anemia, unspecified J45.998 Other asthma Wound Cleansing  Wound #1  Left,Anterior Lower Leg o Clean wound with Normal Saline. o May Shower, gently pat wound dry prior to applying new dressing. Anesthetic (add to Medication List) Wound #1 Left,Anterior Lower Leg o Topical Lidocaine 4% cream applied to wound bed prior to debridement (In Clinic Only). Primary Wound Dressing Wound #1 Left,Anterior Lower Leg o Hydrafera Blue Ready Transfer Secondary Dressing Wound #1 Left,Anterior Lower Leg o ABD and Kerlix/Conform Dressing Change Frequency Wound #1 Left,Anterior Lower Leg o Change dressing every day. Follow-up Appointments Wound #1 Left,Anterior Lower Leg o Return Appointment in 1 week. Edema Control Wound #1 Left,Anterior Lower Leg o Patient to wear own Velcro compression garment. o Elevate legs to the level of the heart and pump ankles as often as possible Laboratory o Bacteria identified in Wound by Culture (MICRO) DALEN, HENNES[place] ([id number]) oooo LOINC Code: [id number] oooo Convenience Name: Wound culture routine Patient Medications Allergies: Novocain, cortisone Notifications Medication Indication Start End Bactrim DS 09/23/2018 DOSE 1 - oral 800 mg-160 mg tablet - 1 tablet oral taken 2 times a day for 14 days Electronic Signature(s) Signed: 09/23/2018 4:06:39 PM By: Lenda Kelp PA-C Entered By: Lenda Kelp on 09/23/2018 16:06:39 Johnathan Scott ([id number]) -------------------------------------------------------------------------------- Problem List Details Patient Name: Johnathan Scott Date of Service: 09/23/2018 3:15 PM Medical Record Number: [id number] Patient Account Number: [id number] Date of Birth/Sex: 1966/07/15 (52 y.o. Male) Treating RN: Huel Coventry Primary Care Provider: Birdie Sons, ERIC Other Clinician: Referring Provider: Birdie Sons, ERIC Treating Provider/Extender: Linwood Dibbles, HOYT Weeks in Treatment: 6 Active Problems ICD-10 Evaluated Encounter Code Description Active Date Today Diagnosis I87.2  Venous insufficiency (chronic) (peripheral) 08/09/2018 No Yes L97.822 Non-pressure chronic ulcer of other part of left lower leg with 08/09/2018 No Yes fat layer exposed D50.9 Iron deficiency anemia, unspecified 08/09/2018 No Yes J45.998 Other asthma 08/09/2018 No Yes Inactive Problems Resolved Problems Electronic Signature(s) Signed: 09/23/2018 9:49:28 PM By: Lenda Kelp PA-C Entered By: Lenda Kelp on 09/23/2018 15:14:19 Johnathan Scott ([id number]) -------------------------------------------------------------------------------- Progress Note Details Patient Name: Johnathan Scott Date of Service: 09/23/2018 3:15 PM Medical Record Number: [id number] Patient Account Number: [id number] Date of Birth/Sex: [birth year] (52 y.o. Male) Treating RN: Huel Coventry Primary Care Provider: Birdie Sons, ERIC Other Clinician: Referring Provider: Birdie Sons, ERIC Treating Provider/Extender: Linwood Dibbles, HOYT Weeks in Treatment: 6 Subjective Chief Complaint Information obtained from Patient Left Anterior LE Ulcer History of Present Illness (HPI) 08/09/18 on evaluation today patient presents for initial evaluation and clinic as referral concerning an ulcer that he has on the left anterior lower Trinity which has been present for five months. He states that he actually noticed this after having an "insect bite" while driving down the road. He states that he did not see what kind of insect it was but he felt it wrong up his leg and then have the bite. Since that time is been having issues with the fact that this area does not want to heal. He does have a history of asthma as well as anemia is most recent CBC revealed a hemoglobin of 11.1 with a hematocrit of 34.6. He does have a history of panic attacks as well intermittently. No fevers, chills, nausea, or vomiting noted at this time. Specifically the patient does not appear to have any infection in regard to the left lower extremity. He's not on the  current antibiotics. 08/16/18 upon evaluation today patient actually appears to be doing significantly better in regard to his wound. He has been tolerating the dressing changes that he was performing on his  own over the last week. Subsequently we did not wrap his leg at that time although we discussed it. We weren't sure that he was gonna be able to get his pants on top of the dressing. Nonetheless the patient fortunately came today with shorts on he states that he is willing to be wrapped if it will help the wound to heal faster. He is pleased with the fact that the wound already appears to be doing better it's also not hurting as badly. The last debridement was a little bit more uncomfortable for him unfortunately. Nonetheless he states he's willing to allow me to debride it again today if I do so carefully. 08/24/18 upon evaluation today patient actually appears to be doing rather well in regard to his lower Trinity ulcer on the left. He has been tolerating the dressing changes with Iodoflex including the wrap without complication and the wound [place] into making signs of good progress. Overall I'm very pleased with how things appear at this time. No fevers, chills, nausea, or vomiting noted at this time. 09/03/18 on evaluation today patient actually appears to be doing rather well in regard to his wound. In fact the overall measurement belies the fact that the wound is actually healed quite a bit more than what the measurement would suggest. In fact it's almost half the size that it was during the last evaluation unfortunately a lot of the area where new skin has grown over is in between two areas that had to be clustered together one of the distal portion of the wound has not completely healed up. Nonetheless there is actually quite a bit more healing than what seems to be represented by the wound measurement today. Overall I'm very pleased with the overall progress and how he seems to be doing  with the dressing changes. The wrap seems to be of great benefit for him as well. 09/09/18 upon evaluation today patient actually appears to be doing excellent in regard to his left lotion the ulcer. He has been tolerating the dressing changes without complication. There does not appear to be any evidence of infection overall I think the [facility] Dressing has done excellent for him. I'm very pleased. 09/23/18 evaluation today patient actually appears to be doing poorly in regard to his lower extremity ulcer. There's a lot of maceration and skin breakdown around the actual wound itself which is making it difficult to even tell exactly what is going on. This is something he states started about four days ago. Nonetheless I'm concerned about the possibility of infection just being that he was not having near this amount of drainage previous. He also states in the past 24 hours the pain has been a little bit more significant. No fevers, chills, nausea, or vomiting noted at this time. Patient History OMARII, SCALZO ([id number]) Information obtained from Patient. Allergies Novocain, cortisone Family History Cancer - Mother,Father,Siblings, Diabetes - Siblings, Heart Disease - Siblings, Hypertension - Siblings, No family history of Hereditary Spherocytosis, Kidney Disease, Lung Disease, Seizures, Stroke, Thyroid Problems, Tuberculosis. Social History Former smoker - 7 years, Marital Status - Single, Alcohol Use - Rarely, Drug Use - No History, Caffeine Use - Never. Review of Systems (ROS) Constitutional Symptoms (General Health) Denies complaints or symptoms of Fever, Chills. Respiratory The patient has no complaints or symptoms. Cardiovascular Complains or has symptoms of LE edema. Psychiatric The patient has no complaints or symptoms. Objective Constitutional Well-nourished and well-hydrated in no acute distress. Vitals Time Taken: 3:26 PM, Height: 61  in, Weight: 232 lbs, BMI: 43.8,  Temperature: 97.7 F, Pulse: 72 bpm, Respiratory Rate: 16 breaths/min, Blood Pressure: 152/77 mmHg. Respiratory normal breathing without difficulty. clear to auscultation bilaterally. Cardiovascular regular rate and rhythm with normal S1, S2. 2+ pitting edema of the bilateral lower extremities. Psychiatric this patient is able to make decisions and demonstrates good insight into disease process. Alert and Oriented x 3. pleasant and cooperative. General Notes: Patient's wound bed currently did show some Slough noted on the surface of the wound although no sharp debridement was performed today simply due to the fact that he is having more discomfort anyway as well as the fact that he potentially has an infection I do not want to spread anything at this point I want to try to get this under control. To that end I'm going to place him on an antibiotic I did perform the wound culture today as well in order to evaluate for what bacteria may be causing the issue and subsequently what we may need to switch the anabiotic to depending on the results of the culture. Integumentary (Hair, Skin) Paro, Nyles ([id number]) Wound #1 status is Open. Original cause of wound was Gradually Appeared. The wound is located on the Left,Anterior Lower Leg. The wound measures 2.3cm length x 2.1cm width x 0.1cm depth; 3.793cm^2 area and 0.379cm^3 volume. There is Fat Layer (Subcutaneous Tissue) Exposed exposed. There is a large amount of serous drainage noted. The wound margin is flat and intact. There is small (1-33%) red, hyper - granulation within the wound bed. There is a medium (34-66%) amount of necrotic tissue within the wound bed including Eschar and Adherent Slough. The periwound skin appearance exhibited: Excoriation, Hemosiderin Staining, Rubor, Erythema. The periwound skin appearance did not exhibit: Callus, Crepitus, Induration, Rash, Scarring, Dry/Scaly, Maceration, Atrophie Blanche, Cyanosis,  Ecchymosis, Mottled, Pallor. The surrounding wound skin color is noted with erythema which is circumferential. Erythema is measured at 7 cm. Periwound temperature was noted as No Abnormality. The periwound has tenderness on palpation. Assessment Active Problems ICD-10 Venous insufficiency (chronic) (peripheral) Non-pressure chronic ulcer of other part of left lower leg with fat layer exposed Iron deficiency anemia, unspecified Other asthma Plan Wound Cleansing: Wound #1 Left,Anterior Lower Leg: Clean wound with Normal Saline. May Shower, gently pat wound dry prior to applying new dressing. Anesthetic (add to Medication List): Wound #1 Left,Anterior Lower Leg: Topical Lidocaine 4% cream applied to wound bed prior to debridement (In Clinic Only). Primary Wound Dressing: Wound #1 Left,Anterior Lower Leg: Hydrafera Blue Ready Transfer Secondary Dressing: Wound #1 Left,Anterior Lower Leg: ABD and Kerlix/Conform Dressing Change Frequency: Wound #1 Left,Anterior Lower Leg: Change dressing every day. Follow-up Appointments: Wound #1 Left,Anterior Lower Leg: Return Appointment in 1 week. Edema Control: Wound #1 Left,Anterior Lower Leg: Patient to wear own Velcro compression garment. Elevate legs to the level of the heart and pump ankles as often as possible Laboratory ordered were: Wound culture routine The following medication(s) was prescribed: Bactrim DS oral 800 mg-160 mg tablet 1 1 tablet oral taken 2 times a day for 14 days starting 09/23/2018 NEELY, CECENA ([id number]) My suggestion currently is gonna be that we continue with the above wound care measures for the next week. Patient is in agreement with plan. If anything changes or worsens in the meantime patient will let me know. Otherwise will see were things stand at follow-up. Please see above for specific wound care orders. We will see patient for re-evaluation in 1 week(s) here in the clinic. If anything  worsens or  changes patient will contact our office for additional recommendations. Electronic Signature(s) Signed: 09/28/2018 8:14:55 AM By: Lenda KelpStone III, Hoyt PA-C Previous Signature: 09/23/2018 9:49:28 PM Version By: Lenda KelpStone III, Hoyt PA-C Entered By: Lenda KelpStone III, Hoyt on 09/28/2018 08:14:03 Johnathan SextonSHAMBLEY, Gerber ([id number]) -------------------------------------------------------------------------------- ROS/PFSH Details Patient Name: Johnathan SextonSHAMBLEY, Jafet Date of Service: 09/23/2018 3:15 PM Medical Record Number: [id number] Patient Account Number: [id number] Date of Birth/Sex: [birth year](52 y.o. Male) Treating RN: Huel CoventryWoody, Kim Primary Care Provider: Birdie SonsSONNENBERG, ERIC Other Clinician: Referring Provider: Birdie SonsSONNENBERG, ERIC Treating Provider/Extender: Linwood DibblesSTONE III, HOYT Weeks in Treatment: 6 Information Obtained From Patient Wound History Do you currently have one or more open woundso Yes How many open wounds do you currently haveo 1 Approximately how long have you had your woundso 5 months How have you been treating your wound(s) until nowo open to air Has your wound(s) ever healed and then re-openedo No Have you had any lab work done in the past montho No Have you tested positive for an antibiotic resistant organism (MRSA, VRE)o No Have you tested positive for osteomyelitis (bone infection)o No Have you had any tests for circulation on your legso No Have you had other problems associated with your woundso Swelling Constitutional Symptoms (General Health) Complaints and Symptoms: Negative for: Fever; Chills Cardiovascular Complaints and Symptoms: Positive for: LE edema Medical History: Negative for: Angina; Arrhythmia; Congestive Heart Failure; Coronary Artery Disease; Deep Vein Thrombosis; Hypertension; Hypotension; Myocardial Infarction; Peripheral Arterial Disease; Peripheral Venous Disease; Phlebitis; Vasculitis Eyes Medical History: Negative for: Cataracts; Glaucoma; Optic  Neuritis Ear/Nose/Mouth/Throat Medical History: Negative for: Chronic sinus problems/congestion; Middle ear problems Hematologic/Lymphatic Medical History: Negative for: Anemia; Hemophilia; Human Immunodeficiency Virus; Lymphedema; Sickle Cell Disease Respiratory Complaints and Symptoms: No Complaints or Symptoms Johnathan SextonSHAMBLEY, [place] ([id number]) Medical History: Positive for: Asthma - history as a child Negative for: Aspiration; Chronic Obstructive Pulmonary Disease (COPD); Pneumothorax; Sleep Apnea; Tuberculosis Gastrointestinal Medical History: Negative for: Cirrhosis ; Colitis; Crohnos; Hepatitis A; Hepatitis B; Hepatitis C Endocrine Medical History: Negative for: Type I Diabetes; Type II Diabetes Genitourinary Medical History: Negative for: End Stage Renal Disease Immunological Medical History: Negative for: Lupus Erythematosus; Raynaudos; Scleroderma Integumentary (Skin) Medical History: Negative for: History of Burn; History of pressure wounds Musculoskeletal Medical History: Positive for: Osteoarthritis - both hips Negative for: Gout; Rheumatoid Arthritis; Osteomyelitis Neurologic Medical History: Positive for: Seizure Disorder - as a child Negative for: Dementia; Neuropathy; Quadriplegia; Paraplegia Oncologic Medical History: Negative for: Received Chemotherapy; Received Radiation Psychiatric Complaints and Symptoms: No Complaints or Symptoms Medical History: Negative for: Anorexia/bulimia; Confinement Anxiety Immunizations Pneumococcal Vaccine: Received Pneumococcal Vaccination: No Implantable Devices Johnathan SextonSHAMBLEY, Claudy ([id number]) Family and Social History Cancer: Yes - Mother,Father,Siblings; Diabetes: Yes - Siblings; Heart Disease: Yes - Siblings; Hereditary Spherocytosis: No; Hypertension: Yes - Siblings; Kidney Disease: No; Lung Disease: No; Seizures: No; Stroke: No; Thyroid Problems: No; Tuberculosis: No; Former smoker - 7 years; Marital Status - Single;  Alcohol Use: Rarely; Drug Use: No History; Caffeine Use: Never; Financial Concerns: No; Food, Clothing or Shelter Needs: No; Support System Lacking: No; Transportation Concerns: No; Advanced Directives: No; Patient does not want information on Advanced Directives Physician Affirmation I have reviewed and agree with the above information. Electronic Signature(s) Signed: 09/23/2018 5:33:18 PM By: Elliot GurneyWoody, BSN, RN, CWS, Kim RN, BSN Signed: 09/23/2018 9:49:28 PM By: Lenda KelpStone III, Hoyt PA-C Entered By: Lenda KelpStone III, Hoyt on 09/23/2018 16:07:53 Johnathan SextonSHAMBLEY, Anothony ([id number]) -------------------------------------------------------------------------------- SuperBill Details Patient Name: Johnathan SextonSHAMBLEY, Sujay Date of Service: 09/23/2018 Medical Record Number: [id number] Patient Account Number: [id number] Date of Birth/Sex: [birth year] (  52 y.o. Male) Treating RN: Huel Coventry Primary Care Provider: Birdie Sons, ERIC Other Clinician: Referring Provider: Birdie Sons, ERIC Treating Provider/Extender: Linwood Dibbles, HOYT Weeks in Treatment: 6 Diagnosis Coding ICD-10 Codes Code Description I87.2 Venous insufficiency (chronic) (peripheral) L97.822 Non-pressure chronic ulcer of other part of left lower leg with fat layer exposed D50.9 Iron deficiency anemia, unspecified J45.998 Other asthma Facility Procedures CPT4 Code: 09811914 Description: 99213 - WOUND CARE VISIT-LEV 3 EST PT Modifier: Quantity: 1 Physician Procedures CPT4 Code Description: 7829562 99214 - WC PHYS LEVEL 4 - EST PT ICD-10 Diagnosis Description I87.2 Venous insufficiency (chronic) (peripheral) L97.822 Non-pressure chronic ulcer of other part of left lower leg wit D50.9 Iron deficiency anemia,  unspecified J45.998 Other asthma Modifier: h fat layer expos Quantity: 1 ed Electronic Signature(s) Signed: 09/23/2018 5:28:33 PM By: Curtis Sites Signed: 09/23/2018 9:49:28 PM By: Lenda Kelp PA-C Entered By: Curtis Sites on 09/23/2018 16:16:20

## 2018-09-27 ENCOUNTER — Other Ambulatory Visit
Admission: RE | Admit: 2018-09-27 | Discharge: 2018-09-27 | Disposition: A | Discharge status: Home / Self Care | Payer: 59 | Source: Ambulatory Visit | Attending: Physician Assistant | Admitting: Physician Assistant

## 2018-09-27 DIAGNOSIS — L02416 Cutaneous abscess of left lower limb: Secondary | ICD-10-CM | POA: Insufficient documentation

## 2018-09-30 ENCOUNTER — Encounter: Payer: 59 | Admitting: Physician Assistant

## 2018-09-30 DIAGNOSIS — L97822 Non-pressure chronic ulcer of other part of left lower leg with fat layer exposed: Secondary | ICD-10-CM | POA: Diagnosis not present

## 2018-09-30 DIAGNOSIS — I872 Venous insufficiency (chronic) (peripheral): Secondary | ICD-10-CM | POA: Diagnosis not present

## 2018-09-30 DIAGNOSIS — L988 Other specified disorders of the skin and subcutaneous tissue: Secondary | ICD-10-CM | POA: Diagnosis not present

## 2018-10-02 LAB — AEROBIC/ANAEROBIC CULTURE W GRAM STAIN (SURGICAL/DEEP WOUND)

## 2018-10-02 LAB — AEROBIC/ANAEROBIC CULTURE (SURGICAL/DEEP WOUND)

## 2018-10-03 NOTE — Progress Notes (Signed)
JODY, SILAS ([id number]) Visit Report for 09/30/2018 Arrival Information Details Patient Name: JUNIE, ENGRAM Date of Service: 09/30/2018 3:15 PM Medical Record Number: [id number] Patient Account Number: [id number] Date of Birth/Sex: 05/21/1966 (52 y.o. M) Treating RN: Curtis Sites Primary Care Alexea Blase: Birdie Sons, ERIC Other Clinician: Referring Leeanne Butters: Birdie Sons, ERIC Treating Karyme Mcconathy/Extender: Linwood Dibbles, HOYT Weeks in Treatment: 7 Visit Information History Since Last Visit Added or deleted any medications: No Patient Arrived: Cane Any new allergies or adverse reactions: No Arrival Time: 15:29 Had a fall or experienced change in No Accompanied By: self activities of daily living that may affect Transfer Assistance: None risk of falls: Patient Identification Verified: Yes Signs or symptoms of abuse/neglect since last visito No Secondary Verification Process Yes Hospitalized since last visit: No Completed: Implantable device outside of the clinic excluding No Patient Has Alerts: Yes cellular tissue based products placed in the center Patient Alerts: PT REFUSED FACE since last visit: PHOTO Has Dressing in Place as Prescribed: Yes Has Compression in Place as Prescribed: Yes Pain Present Now: No Electronic Signature(s) Signed: 10/01/2018 5:23:14 PM By: Curtis Sites Entered By: Curtis Sites on 09/30/2018 15:30:56 [name] ([id number]) -------------------------------------------------------------------------------- Compression Therapy Details Patient Name: [name] Date of Service: 09/30/2018 3:15 PM Medical Record Number: [id number] Patient Account Number: [id number] Date of Birth/Sex: 1965/12/11 (52 y.o. M) Treating RN: Curtis Sites Primary Care Trejon Duford: Birdie Sons, ERIC Other Clinician: Referring Inna Tisdell: Birdie Sons, ERIC Treating Lurena Naeve/Extender: Linwood Dibbles, HOYT Weeks in Treatment: 7 Compression Therapy Performed for Wound  Assessment: Wound #1 Left,Anterior Lower Leg Performed By: Clinician Curtis Sites, RN Compression Type: Three Layer Pre Treatment ABI: 1.2 Post Procedure Diagnosis Same as Pre-procedure Electronic Signature(s) Signed: 10/01/2018 5:23:14 PM By: Curtis Sites Entered By: Curtis Sites on 09/30/2018 16:09:48 [name] ([id number]) -------------------------------------------------------------------------------- Compression Therapy Details Patient Name: [name] Date of Service: 09/30/2018 3:15 PM Medical Record Number: [id number] Patient Account Number: [id number] Date of Birth/Sex: Jul 03, 1966 (52 y.o. M) Treating RN: Curtis Sites Primary Care Brownie Gockel: Birdie Sons, ERIC Other Clinician: Referring Buryl Bamber: Birdie Sons, ERIC Treating Nickolaus Bordelon/Extender: Linwood Dibbles, HOYT Weeks in Treatment: 7 Compression Therapy Performed for Wound Assessment: Wound #2 Left,Lateral Lower Leg Performed By: Clinician Curtis Sites, RN Compression Type: Three Layer Pre Treatment ABI: 1.2 Post Procedure Diagnosis Same as Pre-procedure Electronic Signature(s) Signed: 10/01/2018 5:23:14 PM By: Curtis Sites Entered By: Curtis Sites on 09/30/2018 16:09:48 [name] ([id number]) -------------------------------------------------------------------------------- Lower Extremity Assessment Details Patient Name: [name] Date of Service: 09/30/2018 3:15 PM Medical Record Number: [id number] Patient Account Number: [id number] Date of Birth/Sex: [birth year] (52 y.o. M) Treating RN: Curtis Sites Primary Care Tessla Spurling: Birdie Sons, ERIC Other Clinician: Referring Akim Watkinson: Birdie Sons, ERIC Treating Scotlynn Noyes/Extender: Linwood Dibbles, HOYT Weeks in Treatment: 7 Edema Assessment Assessed: [Left: No] [Right: No] [Left: Edema] [Right: :] Calf Left: Right: Point of Measurement: 34 cm From Medial Instep 50.6 cm cm Ankle Left: Right: Point of Measurement: 10 cm From Medial Instep 27 cm  cm Vascular Assessment Pulses: Dorsalis Pedis Palpable: [Left:Yes] Posterior Tibial Extremity colors, hair growth, and conditions: Extremity Color: [Left:Hyperpigmented] Hair Growth on Extremity: [Left:No] Temperature of Extremity: [Left:Warm] Capillary Refill: [Left:< 3 seconds] Toe Nail Assessment Left: Right: Thick: Yes Discolored: Yes Deformed: Yes Improper Length and Hygiene: Yes Electronic Signature(s) Signed: 10/01/2018 5:23:14 PM By: Curtis Sites Entered By: Curtis Sites on 09/30/2018 15:41:56 [name] ([id number]) -------------------------------------------------------------------------------- Multi Wound Chart Details Patient Name: [name] Date of Service: 09/30/2018 3:15 PM Medical Record Number: [id number] Patient Account Number: [id number] Date of Birth/Sex: 21-Aug-1966 (52 y.o. M) Treating RN: Curtis Sites Primary  Care Lively Haberman: Birdie Sons, ERIC Other Clinician: Referring Lilton Pare: Birdie Sons, ERIC Treating Joshuah Minella/Extender: STONE III, HOYT Weeks in Treatment: 7 Vital Signs Height(in): 61 Pulse(bpm): 72 Weight(lbs): 232 Blood Pressure(mmHg): 173/73 Body Mass Index(BMI): 44 Temperature(F): 98.2 Respiratory Rate 18 (breaths/min): Photos: [1:No Photos] [2:No Photos] [N/A:N/A] Wound Location: [1:Left Lower Leg - Anterior] [2:Left Lower Leg - Lateral] [N/A:N/A] Wounding Event: [1:Gradually Appeared] [2:Gradually Appeared] [N/A:N/A] Primary Etiology: [1:Venous Leg Ulcer] [2:Lymphedema] [N/A:N/A] Comorbid History: [1:Asthma, Osteoarthritis, Seizure Disorder] [2:Asthma, Osteoarthritis, Seizure Disorder] [N/A:N/A] Date Acquired: [1:03/08/2018] [2:09/30/2018] [N/A:N/A] Weeks of Treatment: [1:7] [2:0] [N/A:N/A] Wound Status: [1:Open] [2:Open] [N/A:N/A] Measurements L x W x D [1:7.4x2.6x0.1] [2:1.6x1.4x0.1] [N/A:N/A] (cm) Area (cm) : [1:15.111] [2:1.759] [N/A:N/A] Volume (cm) : [1:1.511] [2:0.176] [N/A:N/A] % Reduction in Area:  [1:-52.70%] [2:N/A] [N/A:N/A] % Reduction in Volume: [1:23.60%] [2:N/A] [N/A:N/A] Classification: [1:Full Thickness Without Exposed Support Structures] [2:Full Thickness Without Exposed Support Structures] [N/A:N/A] Exudate Amount: [1:Large] [2:Large] [N/A:N/A] Exudate Type: [1:Serous] [2:Serous] [N/A:N/A] Exudate Color: [1:amber] [2:amber] [N/A:N/A] Wound Margin: [1:Flat and Intact] [2:Flat and Intact] [N/A:N/A] Granulation Amount: [1:Medium (34-66%)] [2:Large (67-100%)] [N/A:N/A] Granulation Quality: [1:Red, Hyper-granulation] [2:Red] [N/A:N/A] Necrotic Amount: [1:Medium (34-66%)] [2:None Present (0%)] [N/A:N/A] Exposed Structures: [1:Fat Layer (Subcutaneous Tissue) Exposed: Yes Fascia: No Tendon: No Muscle: No Joint: No Bone: No] [2:Fat Layer (Subcutaneous Tissue) Exposed: Yes Fascia: No Tendon: No Muscle: No Joint: No Bone: No] [N/A:N/A] Epithelialization: [1:Small (1-33%)] [2:None] [N/A:N/A] Periwound Skin Texture: [1:Excoriation: Yes Induration: No Callus: No Crepitus: No] [2:Excoriation: No Induration: No Callus: No Crepitus: No] [N/A:N/A] Rash: No Rash: No Scarring: No Scarring: No Periwound Skin Moisture: Maceration: No Maceration: No N/A Dry/Scaly: No Dry/Scaly: No Periwound Skin Color: Erythema: Yes Atrophie Blanche: No N/A Hemosiderin Staining: Yes Cyanosis: No Atrophie Blanche: No Ecchymosis: No Cyanosis: No Erythema: No Ecchymosis: No Hemosiderin Staining: No Mottled: No Mottled: No Pallor: No Pallor: No Rubor: No Rubor: No Erythema Location: Circumferential N/A N/A Erythema Measurement: Measured N/A N/A Temperature: No Abnormality No Abnormality N/A Tenderness on Palpation: Yes Yes N/A Wound Preparation: Ulcer Cleansing: Other: soap Ulcer Cleansing: N/A and water Rinsed/Irrigated with Saline Topical Anesthetic Applied: Topical Anesthetic Applied: Other: lidocaine 4% Other: lidocaine 4% Treatment Notes Electronic Signature(s) Signed: 10/01/2018  5:23:14 PM By: Curtis Sites Entered By: Curtis Sites on 09/30/2018 15:43:27 [name] ([id number]) -------------------------------------------------------------------------------- Multi-Disciplinary Care Plan Details Patient Name: [name] Date of Service: 09/30/2018 3:15 PM Medical Record Number: [id number] Patient Account Number: [id number] Date of Birth/Sex: 06-Nov-1965 (52 y.o. M) Treating RN: Curtis Sites Primary Care Keiko Myricks: Birdie Sons, ERIC Other Clinician: Referring Paige Monarrez: Birdie Sons, ERIC Treating Aubrielle Stroud/Extender: Linwood Dibbles, HOYT Weeks in Treatment: 7 Active Inactive Necrotic Tissue Nursing Diagnoses: Knowledge deficit related to management of necrotic/devitalized tissue Goals: Necrotic/devitalized tissue will be minimized in the wound bed Date Initiated: 08/09/2018 Target Resolution Date: 09/09/2018 Goal Status: Active Interventions: Assess patient pain level pre-, during and post procedure and prior to discharge Treatment Activities: Apply topical anesthetic as ordered : 08/09/2018 Excisional debridement : 08/09/2018 Notes: Orientation to the Wound Care Program Nursing Diagnoses: Knowledge deficit related to the wound healing center program Goals: Patient/caregiver will verbalize understanding of the Wound Healing Center Program Date Initiated: 08/09/2018 Target Resolution Date: 09/09/2018 Goal Status: Active Interventions: Provide education on orientation to the wound center Notes: Wound/Skin Impairment Nursing Diagnoses: Impaired tissue integrity Knowledge deficit related to smoking impact on wound healing Goals: Ulcer/skin breakdown will have a volume reduction of 30% by week 4 IANN, RODIER ([id number]) Date Initiated: 08/09/2018 Target Resolution Date: 09/09/2018 Goal Status: Active Interventions: Assess patient/caregiver ability to obtain necessary supplies  Assess ulceration(s) every visit Treatment Activities: Topical  wound management initiated : 08/09/2018 Notes: Electronic Signature(s) Signed: 10/01/2018 5:23:14 PM By: Curtis Sites Entered By: Curtis Sites on 09/30/2018 15:43:22 [name] ([id number]) -------------------------------------------------------------------------------- Pain Assessment Details Patient Name: [name] Date of Service: 09/30/2018 3:15 PM Medical Record Number: [id number] Patient Account Number: [id number] Date of Birth/Sex: 1965-12-20 (52 y.o. M) Treating RN: Curtis Sites Primary Care Blakleigh Straw: Birdie Sons, ERIC Other Clinician: Referring Kandise Riehle: Birdie Sons, ERIC Treating Kendarious Gudino/Extender: Linwood Dibbles, HOYT Weeks in Treatment: 7 Active Problems Location of Pain Severity and Description of Pain Patient Has Paino Yes Site Locations Pain Location: Pain in Ulcers With Dressing Change: Yes Duration of the Pain. Constant / Intermittento Constant Pain Management and Medication Current Pain Management: Electronic Signature(s) Signed: 10/01/2018 5:23:14 PM By: Curtis Sites Entered By: Curtis Sites on 09/30/2018 15:31:09 [name] ([id number]) -------------------------------------------------------------------------------- Wound Assessment Details Patient Name: [name] Date of Service: 09/30/2018 3:15 PM Medical Record Number: [id number] Patient Account Number: [id number] Date of Birth/Sex: 1966-07-27 (52 y.o. M) Treating RN: Curtis Sites Primary Care Carnita Golob: Birdie Sons, ERIC Other Clinician: Referring Caitriona Sundquist: Birdie Sons, ERIC Treating Consepcion Utt/Extender: Linwood Dibbles, HOYT Weeks in Treatment: 7 Wound Status Wound Number: 1 Primary Etiology: Venous Leg Ulcer Wound Location: Left Lower Leg - Anterior Wound Status: Open Wounding Event: Gradually Appeared Comorbid History: Asthma, Osteoarthritis, Seizure Disorder Date Acquired: 03/08/2018 Weeks Of Treatment: 7 Clustered Wound: No Photos Photo Uploaded By: Curtis Sites on  09/30/2018 17:00:00 Wound Measurements Length: (cm) 7.4 Width: (cm) 2.6 Depth: (cm) 0.1 Area: (cm) 15.111 Volume: (cm) 1.511 % Reduction in Area: -52.7% % Reduction in Volume: 23.6% Epithelialization: Small (1-33%) Tunneling: No Undermining: No Wound Description Full Thickness Without Exposed Support Classification: Structures Wound Margin: Flat and Intact Exudate Large Amount: Exudate Type: Serous Exudate Color: amber Foul Odor After Cleansing: No Slough/Fibrino Yes Wound Bed Granulation Amount: Medium (34-66%) Exposed Structure Granulation Quality: Red, Hyper-granulation Fascia Exposed: No Necrotic Amount: Medium (34-66%) Fat Layer (Subcutaneous Tissue) Exposed: Yes Necrotic Quality: Adherent Slough Tendon Exposed: No Muscle Exposed: No Joint Exposed: No Bone Exposed: No Skufca, Jaymond ([id number]) Periwound Skin Texture Texture Color No Abnormalities Noted: No No Abnormalities Noted: No Callus: No Atrophie Blanche: No Crepitus: No Cyanosis: No Excoriation: Yes Ecchymosis: No Induration: No Erythema: Yes Rash: No Erythema Location: Circumferential Scarring: No Erythema Measurement: Measured Hemosiderin Staining: Yes Moisture Mottled: No No Abnormalities Noted: No Pallor: No Dry / Scaly: No Rubor: No Maceration: No Temperature / Pain Temperature: No Abnormality Tenderness on Palpation: Yes Wound Preparation Ulcer Cleansing: Other: soap and water, Topical Anesthetic Applied: Other: lidocaine 4%, Electronic Signature(s) Signed: 10/01/2018 5:23:14 PM By: Curtis Sites Entered By: Curtis Sites on 09/30/2018 15:38:45 [name] ([id number]) -------------------------------------------------------------------------------- Wound Assessment Details Patient Name: [name] Date of Service: 09/30/2018 3:15 PM Medical Record Number: [id number] Patient Account Number: [id number] Date of Birth/Sex: 15-Oct-1966 (52 y.o. M) Treating RN:  Curtis Sites Primary Care Ndea Kilroy: Birdie Sons, ERIC Other Clinician: Referring Camira Geidel: Birdie Sons, ERIC Treating Millenia Waldvogel/Extender: Linwood Dibbles, HOYT Weeks in Treatment: 7 Wound Status Wound Number: 2 Primary Etiology: Lymphedema Wound Location: Left Lower Leg - Lateral Wound Status: Open Wounding Event: Gradually Appeared Comorbid History: Asthma, Osteoarthritis, Seizure Disorder Date Acquired: 09/30/2018 Weeks Of Treatment: 0 Clustered Wound: No Photos Photo Uploaded By: Curtis Sites on 09/30/2018 17:00:14 Wound Measurements Length: (cm) 1.6 Width: (cm) 1.4 Depth: (cm) 0.1 Area: (cm) 1.759 Volume: (cm) 0.176 % Reduction in Area: % Reduction in Volume: Epithelialization: None Tunneling: No Undermining: No Wound Description Full Thickness Without Exposed Support Classification: Structures  Wound Margin: Flat and Intact Exudate Large Amount: Exudate Type: Serous Exudate Color: amber Foul Odor After Cleansing: No Slough/Fibrino No Wound Bed Granulation Amount: Large (67-100%) Exposed Structure Granulation Quality: Red Fascia Exposed: No Necrotic Amount: None Present (0%) Fat Layer (Subcutaneous Tissue) Exposed: Yes Tendon Exposed: No Muscle Exposed: No Joint Exposed: No Bone Exposed: No Kovaleski, Nathaniel ([id number]) Periwound Skin Texture Texture Color No Abnormalities Noted: No No Abnormalities Noted: No Callus: No Atrophie Blanche: No Crepitus: No Cyanosis: No Excoriation: No Ecchymosis: No Induration: No Erythema: No Rash: No Hemosiderin Staining: No Scarring: No Mottled: No Pallor: No Moisture Rubor: No No Abnormalities Noted: No Dry / Scaly: No Temperature / Pain Maceration: No Temperature: No Abnormality Tenderness on Palpation: Yes Wound Preparation Ulcer Cleansing: Rinsed/Irrigated with Saline Topical Anesthetic Applied: Other: lidocaine 4%, Electronic Signature(s) Signed: 10/01/2018 5:23:14 PM By: Curtis Sitesorthy, Joanna Entered  By: Curtis Sitesorthy, Joanna on 09/30/2018 15:40:20 Rayna SextonSHAMBLEY, Messiah ([id number]) -------------------------------------------------------------------------------- Vitals Details Patient Name: Rayna SextonSHAMBLEY, Barclay Date of Service: 09/30/2018 3:15 PM Medical Record Number: [id number] Patient Account Number: [id number] Date of Birth/Sex: 1966-08-10 (52 y.o. M) Treating RN: Curtis Sitesorthy, Joanna Primary Care Paloma Grange: Birdie SonsSONNENBERG, ERIC Other Clinician: Referring Chester Sibert: Birdie SonsSONNENBERG, ERIC Treating Javana Schey/Extender: Linwood DibblesSTONE III, HOYT Weeks in Treatment: 7 Vital Signs Time Taken: 15:22 Temperature (F): 98.2 Height (in): 61 Pulse (bpm): 72 Weight (lbs): 232 Respiratory Rate (breaths/min): 18 Body Mass Index (BMI): 43.8 Blood Pressure (mmHg): 173/73 Reference Range: 80 - 120 mg / dl Electronic Signature(s) Signed: 10/01/2018 5:23:14 PM By: Curtis Sitesorthy, Joanna Entered By: Curtis Sitesorthy, Joanna on 09/30/2018 15:32:55

## 2018-10-04 NOTE — Progress Notes (Signed)
Johnathan, Scott ([id number]) Visit Report for 09/30/2018 Chief Complaint Document Details Patient Name: Johnathan Scott, Johnathan Scott Date of Service: 09/30/2018 3:15 PM Medical Record Number: [id number] Patient Account Number: [id number] Date of Birth/Sex: 07/24/1966 (52 y.o. M) Treating RN: Huel Coventry Primary Care Provider: Birdie Sons, ERIC Other Clinician: Referring Provider: Birdie Sons, ERIC Treating Provider/Extender: Lenda Kelp Weeks in Treatment: 7 Information Obtained from: Patient Chief Complaint Left Anterior LE Ulcer Electronic Signature(s) Signed: 10/01/2018 8:01:46 PM By: Lenda Kelp PA-C Entered By: Lenda Kelp on 09/30/2018 15:44:08 Johnathan Scott ([id number]) -------------------------------------------------------------------------------- HPI Details Patient Name: Johnathan Scott Date of Service: 09/30/2018 3:15 PM Medical Record Number: [id number] Patient Account Number: [id number] Date of Birth/Sex: [birth year] (52 y.o. M) Treating RN: Huel Coventry Primary Care Provider: Birdie Sons, ERIC Other Clinician: Referring Provider: Birdie Sons, ERIC Treating Provider/Extender: Linwood Dibbles, Caera Enwright Weeks in Treatment: 7 History of Present Illness HPI Description: 08/09/18 on evaluation today patient presents for initial evaluation and clinic as referral concerning an ulcer that he has on the left anterior lower Trinity which has been present for five months. He states that he actually noticed this after having an "insect bite" while driving down the road. He states that he did not see what kind of insect it was but he felt it wrong up his leg and then have the bite. Since that time is been having issues with the fact that this area does not want to heal. He does have a history of asthma as well as anemia is most recent CBC revealed a hemoglobin of 11.1 with a hematocrit of 34.6. He does have a history of panic attacks as well intermittently. No fevers, chills, nausea, or vomiting  noted at this time. Specifically the patient does not appear to have any infection in regard to the left lower extremity. He's not on the current antibiotics. 08/16/18 upon evaluation today patient actually appears to be doing significantly better in regard to his wound. He has been tolerating the dressing changes that he was performing on his own over the last week. Subsequently we did not wrap his leg at that time although we discussed it. We weren't sure that he was gonna be able to get his pants on top of the dressing. Nonetheless the patient fortunately came today with shorts on he states that he is willing to be wrapped if it will help the wound to heal faster. He is pleased with the fact that the wound already appears to be doing better it's also not hurting as badly. The last debridement was a little bit more uncomfortable for him unfortunately. Nonetheless he states he's willing to allow me to debride it again today if I do so carefully. 08/24/18 upon evaluation today patient actually appears to be doing rather well in regard to his lower Trinity ulcer on the left. He has been tolerating the dressing changes with Iodoflex including the wrap without complication and the wound [place] into making signs of good progress. Overall I'm very pleased with how things appear at this time. No fevers, chills, nausea, or vomiting noted at this time. 09/03/18 on evaluation today patient actually appears to be doing rather well in regard to his wound. In fact the overall measurement belies the fact that the wound is actually healed quite a bit more than what the measurement would suggest. In fact it's almost half the size that it was during the last evaluation unfortunately a lot of the area where new skin has grown over is in between two areas that had to  be clustered together one of the distal portion of the wound has not completely healed up. Nonetheless there is actually quite a bit more healing than what  seems to be represented by the wound measurement today. Overall I'm very pleased with the overall progress and how he seems to be doing with the dressing changes. The wrap seems to be of great benefit for him as well. 09/09/18 upon evaluation today patient actually appears to be doing excellent in regard to his left lotion the ulcer. He has been tolerating the dressing changes without complication. There does not appear to be any evidence of infection overall I think the [facility] Dressing has done excellent for him. I'm very pleased. 09/23/18 evaluation today patient actually appears to be doing poorly in regard to his lower extremity ulcer. There's a lot of maceration and skin breakdown around the actual wound itself which is making it difficult to even tell exactly what is going on. This is something he states started about four days ago. Nonetheless I'm concerned about the possibility of infection just being that he was not having near this amount of drainage previous. He also states in the past 24 hours the pain has been a little bit more significant. No fevers, chills, nausea, or vomiting noted at this time. 09/30/18 on evaluation today patient actually appears to be doing poorly in regard to his lower extremity ulcer. He's been tolerating the dressing changes although there is a lot of tape irritation surrounding the wound area. Subsequently I do think that he would benefit from [place] initiating treatment with all the compression wrap as we did before to avoid any additional injury to the area. No fevers, chills, nausea, or vomiting noted at this time. The patient did have a return of his wound culture which showed Pseudomonas which will not be covered by the Bactrim that I previously prescribed for him. With that being said the patient states that he did not know I was sitting in a prescription for him and therefore never picked this up he also states the [facility] Fus  ([id number]) pharmacy did not call him. Nonetheless this was something that was sent we will call the pharmacy and having counseling as I'm gonna have to send them something different. Electronic Signature(s) Signed: 10/01/2018 8:01:46 PM By: Lenda Kelp PA-C Entered By: Lenda Kelp on 10/01/2018 16:00:03 Johnathan Scott ([id number]) -------------------------------------------------------------------------------- Physical Exam Details Patient Name: Johnathan Scott Date of Service: 09/30/2018 3:15 PM Medical Record Number: [id number] Patient Account Number: [id number] Date of Birth/Sex: 1966-06-25 (52 y.o. M) Treating RN: Huel Coventry Primary Care Provider: Birdie Sons, ERIC Other Clinician: Referring Provider: Birdie Sons, ERIC Treating Provider/Extender: STONE III, Jaquelin Meaney Weeks in Treatment: 7 Constitutional Well-nourished and well-hydrated in no acute distress. Respiratory normal breathing without difficulty. clear to auscultation bilaterally. Cardiovascular regular rate and rhythm with normal S1, S2. 1+ pitting edema of the bilateral lower extremities. Psychiatric this patient is able to make decisions and demonstrates good insight into disease process. Alert and Oriented x 3. pleasant and cooperative. Notes Patient's wound bed upon evaluation today shows evidence of being about the same as the previous evaluation. There's no significant improvement overall and there is some additional tape injury from the adhesive from the bandages. Electronic Signature(s) Signed: 10/01/2018 8:01:46 PM By: Lenda Kelp PA-C Entered By: Lenda Kelp on 10/01/2018 16:00:38 Johnathan Scott ([id number]) -------------------------------------------------------------------------------- Physician Orders Details Patient Name: Johnathan Scott Date of Service: 09/30/2018 3:15 PM Medical Record Number: [id number] Patient Account Number: [id number] Date  of Birth/Sex: 09/20/1966 (52 y.o.  M) Treating RN: Curtis Sites Primary Care Provider: Birdie Sons, ERIC Other Clinician: Referring Provider: Birdie Sons, ERIC Treating Provider/Extender: Linwood Dibbles, Zaylynn Rickett Weeks in Treatment: 7 Verbal / Phone Orders: No Diagnosis Coding ICD-10 Coding Code Description I87.2 Venous insufficiency (chronic) (peripheral) L97.822 Non-pressure chronic ulcer of other part of left lower leg with fat layer exposed D50.9 Iron deficiency anemia, unspecified J45.998 Other asthma Wound Cleansing Wound #1 Left,Anterior Lower Leg o Clean wound with Normal Saline. o May Shower, gently pat wound dry prior to applying new dressing. Wound #2 Left,Lateral Lower Leg o Clean wound with Normal Saline. o May Shower, gently pat wound dry prior to applying new dressing. Anesthetic (add to Medication List) Wound #1 Left,Anterior Lower Leg o Topical Lidocaine 4% cream applied to wound bed prior to debridement (In Clinic Only). Wound #2 Left,Lateral Lower Leg o Topical Lidocaine 4% cream applied to wound bed prior to debridement (In Clinic Only). Primary Wound Dressing Wound #1 Left,Anterior Lower Leg o Hydrafera Blue Ready Transfer Wound #2 Left,Lateral Lower Leg o Hydrafera Blue Ready Transfer Secondary Dressing Wound #1 Left,Anterior Lower Leg o ABD pad o XtraSorb Wound #2 Left,Lateral Lower Leg o ABD pad o XtraSorb Dressing Change Frequency Wound #1 Left,Anterior Lower Leg o Change dressing every week Brocks, Kamir ([id number]) Wound #2 Left,Lateral Lower Leg o Change dressing every week Follow-up Appointments Wound #1 Left,Anterior Lower Leg o Return Appointment in 1 week. Wound #2 Left,Lateral Lower Leg o Return Appointment in 1 week. Edema Control Wound #1 Left,Anterior Lower Leg o 3 Layer Compression System - Left Lower Extremity o Elevate legs to the level of the heart and pump ankles as often as possible Wound #2 Left,Lateral Lower Leg o 3 Layer  Compression System - Left Lower Extremity o Elevate legs to the level of the heart and pump ankles as often as possible Patient Medications Allergies: Novocain, cortisone Notifications Medication Indication Start End Levaquin 09/30/2018 DOSE 1 - oral 750 mg tablet - 1 tablet oral taken 1 time a day for 14 days Electronic Signature(s) Signed: 09/30/2018 4:32:30 PM By: Lenda Kelp PA-C Entered By: Lenda Kelp on 09/30/2018 16:32:30 Johnathan Scott ([id number]) -------------------------------------------------------------------------------- Problem List Details Patient Name: Johnathan Scott Date of Service: 09/30/2018 3:15 PM Medical Record Number: [id number] Patient Account Number: [id number] Date of Birth/Sex: 07/21/1966 (52 y.o. M) Treating RN: Huel Coventry Primary Care Provider: Birdie Sons, ERIC Other Clinician: Referring Provider: Birdie Sons, ERIC Treating Provider/Extender: Linwood Dibbles, Daryel Kenneth Weeks in Treatment: 7 Active Problems ICD-10 Evaluated Encounter Code Description Active Date Today Diagnosis I87.2 Venous insufficiency (chronic) (peripheral) 08/09/2018 No Yes L97.822 Non-pressure chronic ulcer of other part of left lower leg with 08/09/2018 No Yes fat layer exposed D50.9 Iron deficiency anemia, unspecified 08/09/2018 No Yes J45.998 Other asthma 08/09/2018 No Yes Inactive Problems Resolved Problems Electronic Signature(s) Signed: 10/01/2018 8:01:46 PM By: Lenda Kelp PA-C Entered By: Lenda Kelp on 09/30/2018 15:44:04 Johnathan Scott ([id number]) -------------------------------------------------------------------------------- Progress Note Details Patient Name: Johnathan Scott Date of Service: 09/30/2018 3:15 PM Medical Record Number: [id number] Patient Account Number: [id number] Date of Birth/Sex: 1966/04/28 (52 y.o. M) Treating RN: Huel Coventry Primary Care Provider: Birdie Sons, ERIC Other Clinician: Referring Provider: Birdie Sons, ERIC Treating  Provider/Extender: Linwood Dibbles, Chriss Mannan Weeks in Treatment: 7 Subjective Chief Complaint Information obtained from Patient Left Anterior LE Ulcer History of Present Illness (HPI) 08/09/18 on evaluation today patient presents for initial evaluation and clinic as referral concerning an ulcer that he has on the left anterior lower Trinity which  has been present for five months. He states that he actually noticed this after having an "insect bite" while driving down the road. He states that he did not see what kind of insect it was but he felt it wrong up his leg and then have the bite. Since that time is been having issues with the fact that this area does not want to heal. He does have a history of asthma as well as anemia is most recent CBC revealed a hemoglobin of 11.1 with a hematocrit of 34.6. He does have a history of panic attacks as well intermittently. No fevers, chills, nausea, or vomiting noted at this time. Specifically the patient does not appear to have any infection in regard to the left lower extremity. He's not on the current antibiotics. 08/16/18 upon evaluation today patient actually appears to be doing significantly better in regard to his wound. He has been tolerating the dressing changes that he was performing on his own over the last week. Subsequently we did not wrap his leg at that time although we discussed it. We weren't sure that he was gonna be able to get his pants on top of the dressing. Nonetheless the patient fortunately came today with shorts on he states that he is willing to be wrapped if it will help the wound to heal faster. He is pleased with the fact that the wound already appears to be doing better it's also not hurting as badly. The last debridement was a little bit more uncomfortable for him unfortunately. Nonetheless he states he's willing to allow me to debride it again today if I do so carefully. 08/24/18 upon evaluation today patient actually appears to be  doing rather well in regard to his lower Trinity ulcer on the left. He has been tolerating the dressing changes with Iodoflex including the wrap without complication and the wound us into making signs of good progress. Overall I'm very pleased with how things appear at this time. No fevers, chills, nausea, or vomiting noted at this time. 09/03/18 on evaluation today patient actually appears to be doing rather well in regard to his wound. In fact the overall measurement belies the fact that the wound is actually healed quite a bit more than what the measurement would suggest. In fact it's almost half the size that it was during the last evaluation unfortunately a lot of the area where new skin has grown over is in between two areas that had to be clustered together one of the distal portion of the wound has not completely healed up. Nonetheless there is actually quite a bit more healing than what seems to be represented by the wound measurement today. Overall I'm very pleased with the overall progress and how he seems to be doing with the dressing changes. The wrap seems to be of great benefit for him as well. 09/09/18 upon evaluation today patient actually appears to be doing excellent in regard to his left lotion the ulcer. He has been tolerating the dressing changes without complication. There does not appear to be any evidence of infection overall I [facility] excellent for him. I'm very pleased. 09/23/18 evaluation today patient actually appears to be doing poorly in regard to his lower extremity ulcer. There's a lot of maceration and skin breakdown around the actual wound itself which is making it difficult to even tell exactly what is going on. This is something he states started about four days ago. Nonetheless I'm concerned  about the possibility of infection just being that he was not having near this amount of drainage previous. He also states in the past 24  hours the pain has been a little bit more significant. No fevers, chills, nausea, or vomiting noted at this time. 09/30/18 on evaluation today patient actually appears to be doing poorly in regard to his lower extremity ulcer. He's been tolerating the dressing changes although there is a lot of tape irritation surrounding the wound area. Subsequently I do think [facility] [place] ([id number]) that he would benefit from [place] initiating treatment with all the compression wrap as we did before to avoid any additional injury to the area. No fevers, chills, nausea, or vomiting noted at this time. The patient did have a return of his wound culture which showed Pseudomonas which will not be covered by the Bactrim that I previously prescribed for him. With that being said the patient states that he did not know I was sitting in a prescription for him and therefore never picked this up he also states the pharmacy did not call him. Nonetheless this was something that was sent we will call the pharmacy and having counseling as I'm gonna have to send them something different. Patient History Information obtained from Patient. Family History Cancer - Mother,Father,Siblings, Diabetes - Siblings, Heart Disease - Siblings, Hypertension - Siblings, No family history of Hereditary Spherocytosis, Kidney Disease, Lung Disease, Seizures, Stroke, Thyroid Problems, Tuberculosis. Social History Former smoker - 7 years, Marital Status - Single, Alcohol Use - Rarely, Drug Use - No History, Caffeine Use - Never. Review of Systems (ROS) Constitutional Symptoms (General Health) Denies complaints or symptoms of Fever, Chills. Respiratory The patient has no complaints or symptoms. Cardiovascular Complains or has symptoms of LE edema. Psychiatric The patient has no complaints or symptoms. Objective Constitutional Well-nourished and well-hydrated in no acute distress. Vitals Time Taken: 3:22 PM, Height: 61 in, Weight: 232  lbs, BMI: 43.8, Temperature: 98.2 F, Pulse: 72 bpm, Respiratory Rate: 18 breaths/min, Blood Pressure: 173/73 mmHg. Respiratory normal breathing without difficulty. clear to auscultation bilaterally. Cardiovascular regular rate and rhythm with normal S1, S2. 1+ pitting edema of the bilateral lower extremities. Psychiatric this patient is able to make decisions and demonstrates good insight into disease process. Alert and Oriented x 3. pleasant and cooperative. General Notes: Patient's wound bed upon evaluation today shows evidence of being about the same as the previous evaluation. There's no significant improvement overall and there is some additional tape injury from the adhesive from the Phillips, Hendrik ([id number]) bandages. Integumentary (Hair, Skin) Wound #1 status is Open. Original cause of wound was Gradually Appeared. The wound is located on the Left,Anterior Lower Leg. The wound measures 7.4cm length x 2.6cm width x 0.1cm depth; 15.111cm^2 area and 1.511cm^3 volume. There is Fat Layer (Subcutaneous Tissue) Exposed exposed. There is no tunneling or undermining noted. There is a large amount of serous drainage noted. The wound margin is flat and intact. There is medium (34-66%) red, hyper - granulation within the wound bed. There is a medium (34-66%) amount of necrotic tissue within the wound bed including Adherent Slough. The periwound skin appearance exhibited: Excoriation, Hemosiderin Staining, Erythema. The periwound skin appearance did not exhibit: Callus, Crepitus, Induration, Rash, Scarring, Dry/Scaly, Maceration, Atrophie Blanche, Cyanosis, Ecchymosis, Mottled, Pallor, Rubor. The surrounding wound skin color is noted with erythema which is circumferential. Erythema is measured. Periwound temperature was noted as No Abnormality. The periwound has tenderness on palpation. Wound #2 status is Open. Original cause of  wound was Gradually Appeared. The wound is located on the  Left,Lateral Lower Leg. The wound measures 1.6cm length x 1.4cm width x 0.1cm depth; 1.759cm^2 area and 0.176cm^3 volume. There is Fat Layer (Subcutaneous Tissue) Exposed exposed. There is no tunneling or undermining noted. There is a large amount of serous drainage noted. The wound margin is flat and intact. There is large (67-100%) red granulation within the wound bed. There is no necrotic tissue within the wound bed. The periwound skin appearance did not exhibit: Callus, Crepitus, Excoriation, Induration, Rash, Scarring, Dry/Scaly, Maceration, Atrophie Blanche, Cyanosis, Ecchymosis, Hemosiderin Staining, Mottled, Pallor, Rubor, Erythema. Periwound temperature was noted as No Abnormality. The periwound has tenderness on palpation. Assessment Active Problems ICD-10 Venous insufficiency (chronic) (peripheral) Non-pressure chronic ulcer of other part of left lower leg with fat layer exposed Iron deficiency anemia, unspecified Other asthma Procedures Wound #1 Pre-procedure diagnosis of Wound #1 is a Venous Leg Ulcer located on the Left,Anterior Lower Leg . There was a Three Layer Compression Therapy Procedure with a pre-treatment ABI of 1.2 by Curtis Sites, RN. Post procedure Diagnosis Wound #1: Same as Pre-Procedure Wound #2 Pre-procedure diagnosis of Wound #2 is a Venous Leg Ulcer located on the Left,Lateral Lower Leg . There was a Three Layer Compression Therapy Procedure with a pre-treatment ABI of 1.2 by Curtis Sites, RN. Post procedure Diagnosis Wound #2: Same as Pre-Procedure BROEDY, [place] ([id number]) Plan Wound Cleansing: Wound #1 Left,Anterior Lower Leg: Clean wound with Normal Saline. May Shower, gently pat wound dry prior to applying new dressing. Wound #2 Left,Lateral Lower Leg: Clean wound with Normal Saline. May Shower, gently pat wound dry prior to applying new dressing. Anesthetic (add to Medication List): Wound #1 Left,Anterior Lower Leg: Topical Lidocaine 4%  cream applied to wound bed prior to debridement (In Clinic Only). Wound #2 Left,Lateral Lower Leg: Topical Lidocaine 4% cream applied to wound bed prior to debridement (In Clinic Only). Primary Wound Dressing: Wound #1 Left,Anterior Lower Leg: Hydrafera Blue Ready Transfer Wound #2 Left,Lateral Lower Leg: Hydrafera Blue Ready Transfer Secondary Dressing: Wound #1 Left,Anterior Lower Leg: ABD pad XtraSorb Wound #2 Left,Lateral Lower Leg: ABD pad XtraSorb Dressing Change Frequency: Wound #1 Left,Anterior Lower Leg: Change dressing every week Wound #2 Left,Lateral Lower Leg: Change dressing every week Follow-up Appointments: Wound #1 Left,Anterior Lower Leg: Return Appointment in 1 week. Wound #2 Left,Lateral Lower Leg: Return Appointment in 1 week. Edema Control: Wound #1 Left,Anterior Lower Leg: 3 Layer Compression System - Left Lower Extremity Elevate legs to the level of the heart and pump ankles as often as possible Wound #2 Left,Lateral Lower Leg: 3 Layer Compression System - Left Lower Extremity Elevate legs to the level of the heart and pump ankles as often as possible The following medication(s) was prescribed: Levaquin oral 750 mg tablet 1 1 tablet oral taken 1 time a day for 14 days starting 09/30/2018 My suggestion at this point is gonna be that we go ahead and initiate treatment with the appropriate anabiotic to cover the Pseudomonas at this time. The patient is in agreement with the plan. Subsequently we're gonna see him back for reevaluation in one week to see were things stand and for rapid change. He did well the three layer compression wrap in the past I'm hopeful he will do so again. Please see above for specific wound care orders. We will see patient for re-evaluation in 1 week(s) here in the clinic. If anything worsens or changes patient will contact our office for additional recommendations. Mole,  Sidell ([id number]) Electronic Signature(s) Signed:  10/01/2018 8:01:46 PM By: Lenda Kelp PA-C Entered By: Lenda Kelp on 10/01/2018 16:01:03 Johnathan Scott ([id number]) -------------------------------------------------------------------------------- ROS/PFSH Details Patient Name: Johnathan Scott Date of Service: 09/30/2018 3:15 PM Medical Record Number: [id number] Patient Account Number: [id number] Date of Birth/Sex: 1966-04-12 (52 y.o. M) Treating RN: Huel Coventry Primary Care Provider: Birdie Sons, ERIC Other Clinician: Referring Provider: Birdie Sons, ERIC Treating Provider/Extender: Linwood Dibbles, Roey Coopman Weeks in Treatment: 7 Information Obtained From Patient Wound History Do you currently have one or more open woundso Yes How many open wounds do you currently haveo 1 Approximately how long have you had your woundso 5 months How have you been treating your wound(s) until nowo open to air Has your wound(s) ever healed and then re-openedo No Have you had any lab work done in the past montho No Have you tested positive for an antibiotic resistant organism (MRSA, VRE)o No Have you tested positive for osteomyelitis (bone infection)o No Have you had any tests for circulation on your legso No Have you had other problems associated with your woundso Swelling Constitutional Symptoms (General Health) Complaints and Symptoms: Negative for: Fever; Chills Cardiovascular Complaints and Symptoms: Positive for: LE edema Medical History: Negative for: Angina; Arrhythmia; Congestive Heart Failure; Coronary Artery Disease; Deep Vein Thrombosis; Hypertension; Hypotension; Myocardial Infarction; Peripheral Arterial Disease; Peripheral Venous Disease; Phlebitis; Vasculitis Eyes Medical History: Negative for: Cataracts; Glaucoma; Optic Neuritis Ear/Nose/Mouth/Throat Medical History: Negative for: Chronic sinus problems/congestion; Middle ear problems Hematologic/Lymphatic Medical History: Negative for: Anemia; Hemophilia; Human  Immunodeficiency Virus; Lymphedema; Sickle Cell Disease Respiratory Complaints and Symptoms: No Complaints or Symptoms YAHSIR, WICKENS ([id number]) Medical History: Positive for: Asthma - history as a child Negative for: Aspiration; Chronic Obstructive Pulmonary Disease (COPD); Pneumothorax; Sleep Apnea; Tuberculosis Gastrointestinal Medical History: Negative for: Cirrhosis ; Colitis; Crohnos; Hepatitis A; Hepatitis B; Hepatitis C Endocrine Medical History: Negative for: Type I Diabetes; Type II Diabetes Genitourinary Medical History: Negative for: End Stage Renal Disease Immunological Medical History: Negative for: Lupus Erythematosus; Raynaudos; Scleroderma Integumentary (Skin) Medical History: Negative for: History of Burn; History of pressure wounds Musculoskeletal Medical History: Positive for: Osteoarthritis - both hips Negative for: Gout; Rheumatoid Arthritis; Osteomyelitis Neurologic Medical History: Positive for: Seizure Disorder - as a child Negative for: Dementia; Neuropathy; Quadriplegia; Paraplegia Oncologic Medical History: Negative for: Received Chemotherapy; Received Radiation Psychiatric Complaints and Symptoms: No Complaints or Symptoms Medical History: Negative for: Anorexia/bulimia; Confinement Anxiety Immunizations Pneumococcal Vaccine: Received Pneumococcal Vaccination: No Implantable Devices HOGAN, HOOBLER ([id number]) Family and Social History Cancer: Yes - Mother,Father,Siblings; Diabetes: Yes - Siblings; Heart Disease: Yes - Siblings; Hereditary Spherocytosis: No; Hypertension: Yes - Siblings; Kidney Disease: No; Lung Disease: No; Seizures: No; Stroke: No; Thyroid Problems: No; Tuberculosis: No; Former smoker - 7 years; Marital Status - Single; Alcohol Use: Rarely; Drug Use: No History; Caffeine Use: Never; Financial Concerns: No; Food, Clothing or Shelter Needs: No; Support System Lacking: No; Transportation Concerns: No; Advanced  Directives: No; Patient does not want information on Advanced Directives Physician Affirmation I have reviewed and agree with the above information. Electronic Signature(s) Signed: 10/01/2018 8:01:46 PM By: Lenda Kelp PA-C Signed: 10/04/2018 11:07:52 AM By: Elliot Gurney, BSN, RN, CWS, Kim RN, BSN Entered By: Lenda Kelp on 10/01/2018 16:00:20 Johnathan Scott ([id number]) -------------------------------------------------------------------------------- SuperBill Details Patient Name: Johnathan Scott Date of Service: 09/30/2018 Medical Record Number: [id number] Patient Account Number: [id number] Date of Birth/Sex: 1966/03/11 (52 y.o. M) Treating RN: Curtis Sites Primary Care Provider: Birdie Sons ERIC Other Clinician: Referring Provider: Birdie Sons, ERIC Treating Provider/Extender:  STONE III, Skylen Danielsen Weeks in Treatment: 7 Diagnosis Coding ICD-10 Codes Code Description I87.2 Venous insufficiency (chronic) (peripheral) L97.822 Non-pressure chronic ulcer of other part of left lower leg with fat layer exposed D50.9 Iron deficiency anemia, unspecified J45.998 Other asthma Facility Procedures CPT4 Code: 16109604 Description: (Facility Use Only) [phone] - APPLY MULTLAY COMPRS LWR LT LEG Modifier: Quantity: 1 Physician Procedures CPT4 Code Description: [id number] - WC PHYS LEVEL 4 - EST PT ICD-10 Diagnosis Description I87.2 Venous insufficiency (chronic) (peripheral) L97.822 Non-pressure chronic ulcer of other part of left lower leg wit D50.9 Iron deficiency anemia,  unspecified J45.998 Other asthma Modifier: h fat layer expos Quantity: 1 ed Electronic Signature(s) Signed: 10/01/2018 8:01:46 PM By: Lenda Kelp PA-C Entered By: Lenda Kelp on 10/01/2018 16:01:22

## 2018-10-08 ENCOUNTER — Ambulatory Visit: Payer: 59 | Admitting: Physician Assistant

## 2018-10-08 ENCOUNTER — Encounter: Payer: 59 | Admitting: Physician Assistant

## 2018-10-08 DIAGNOSIS — I872 Venous insufficiency (chronic) (peripheral): Secondary | ICD-10-CM | POA: Diagnosis not present

## 2018-10-08 DIAGNOSIS — L97822 Non-pressure chronic ulcer of other part of left lower leg with fat layer exposed: Secondary | ICD-10-CM | POA: Diagnosis not present

## 2018-10-12 ENCOUNTER — Encounter: Payer: 59 | Admitting: Physician Assistant

## 2018-10-12 DIAGNOSIS — L97822 Non-pressure chronic ulcer of other part of left lower leg with fat layer exposed: Secondary | ICD-10-CM | POA: Diagnosis not present

## 2018-10-12 DIAGNOSIS — I872 Venous insufficiency (chronic) (peripheral): Secondary | ICD-10-CM | POA: Diagnosis not present

## 2018-10-13 NOTE — Progress Notes (Signed)
Johnathan Scott, Johnathan Scott ([id number]) Visit Report for 10/08/2018 Arrival Information Details Patient Name: Johnathan Scott, Johnathan Scott Date of Service: 10/08/2018 3:30 PM Medical Record Number: [id number] Patient Account Number: [id number] Date of Birth/Sex: [birth year] (52 y.o. M) Treating RN: Curtis Sitesorthy, Joanna Primary Care Eddy Termine: Birdie SonsSONNENBERG, ERIC Other Clinician: Referring Bowyn Mercier: Birdie SonsSONNENBERG, ERIC Treating Celestine Bougie/Extender: Linwood DibblesSTONE III, HOYT Weeks in Treatment: 8 Visit Information History Since Last Visit Added or deleted any medications: No Patient Arrived: Cane Any new allergies or adverse reactions: No Arrival Time: 15:41 Had a fall or experienced change in No Accompanied By: self activities of daily living that may affect Transfer Assistance: None risk of falls: Patient Identification Verified: Yes Signs or symptoms of abuse/neglect since last visito No Secondary Verification Process Yes Hospitalized since last visit: No Completed: Implantable device outside of the clinic excluding No Patient Has Alerts: Yes cellular tissue based products placed in the center Patient Alerts: PT REFUSED FACE since last visit: PHOTO Has Dressing in Place as Prescribed: Yes Has Compression in Place as Prescribed: Yes Pain Present Now: Yes Electronic Signature(s) Signed: 10/08/2018 5:12:41 PM By: Curtis Sitesorthy, Joanna Entered By: Curtis Sitesorthy, Joanna on 10/08/2018 15:41:48 Johnathan Scott, Johnathan Scott ([id number]) -------------------------------------------------------------------------------- Compression Therapy Details Patient Name: Johnathan Scott, Johnathan Scott Date of Service: 10/08/2018 3:30 PM Medical Record Number: [id number] Patient Account Number: [id number] Date of Birth/Sex: [birth year] (52 y.o. M) Treating RN: Arnette NorrisBiell, Kristina Primary Care Derald Lorge: Birdie SonsSONNENBERG, ERIC Other Clinician: Referring Loyalty Arentz: Birdie SonsSONNENBERG, ERIC Treating Kyonna Frier/Extender: Linwood DibblesSTONE III, HOYT Weeks in Treatment: 8 Compression Therapy Performed for Wound  Assessment: Wound #2 Left,Lateral Lower Leg Performed By: Clinician Arnette NorrisBiell, Kristina, RN Compression Type: Three Layer Pre Treatment ABI: 1.2 Post Procedure Diagnosis Same as Pre-procedure Electronic Signature(s) Signed: 10/12/2018 1:37:43 PM By: Arnette NorrisBiell, Kristina Entered By: Arnette NorrisBiell, Kristina on 10/08/2018 16:27:51 Johnathan Scott, Johnathan Scott ([id number]) -------------------------------------------------------------------------------- Lower Extremity Assessment Details Patient Name: Johnathan Scott, Johnathan Scott Date of Service: 10/08/2018 3:30 PM Medical Record Number: [id number] Patient Account Number: [id number] Date of Birth/Sex: [birth year] (52 y.o. M) Treating RN: Curtis Sitesorthy, Joanna Primary Care Casaundra Takacs: Birdie SonsSONNENBERG, ERIC Other Clinician: Referring Hunter Bachar: Birdie SonsSONNENBERG, ERIC Treating Colbert Curenton/Extender: Linwood DibblesSTONE III, HOYT Weeks in Treatment: 8 Edema Assessment Assessed: [Left: No] [Right: No] [Left: Edema] [Right: :] Calf Left: Right: Point of Measurement: 34 cm From Medial Instep 53 cm cm Ankle Left: Right: Point of Measurement: 10 cm From Medial Instep 27 cm cm Vascular Assessment Pulses: Dorsalis Pedis Palpable: [Left:Yes] Posterior Tibial Extremity colors, hair growth, and conditions: Extremity Color: [Left:Hyperpigmented] Hair Growth on Extremity: [Left:Yes] Temperature of Extremity: [Left:Warm] Capillary Refill: [Left:< 3 seconds] Toe Nail Assessment Left: Right: Thick: Yes Discolored: Yes Deformed: Yes Improper Length and Hygiene: Yes Electronic Signature(s) Signed: 10/08/2018 5:12:41 PM By: Curtis Sitesorthy, Joanna Entered By: Curtis Sitesorthy, Joanna on 10/08/2018 15:50:00 Johnathan Scott, Johnathan Scott ([id number]) -------------------------------------------------------------------------------- Multi Wound Chart Details Patient Name: Johnathan Scott, Johnathan Scott Date of Service: 10/08/2018 3:30 PM Medical Record Number: [id number] Patient Account Number: [id number] Date of Birth/Sex: [birth year] (52 y.o. M) Treating RN: Arnette NorrisBiell,  Kristina Primary Care Morgyn Marut: Birdie SonsSONNENBERG, ERIC Other Clinician: Referring Dayleen Beske: Birdie SonsSONNENBERG, ERIC Treating Timouthy Gilardi/Extender: Linwood DibblesSTONE III, HOYT Weeks in Treatment: 8 Vital Signs Height(in): 61 Pulse(bpm): 77 Weight(lbs): 232 Blood Pressure(mmHg): 130/70 Body Mass Index(BMI): 44 Temperature(F): 97.6 Respiratory Rate 16 (breaths/min): Photos: [1:No Photos] [2:No Photos] [N/A:N/A] Wound Location: [1:Left Lower Leg - Anterior] [2:Left Lower Leg - Lateral] [N/A:N/A] Wounding Event: [1:Gradually Appeared] [2:Gradually Appeared] [N/A:N/A] Primary Etiology: [1:Venous Leg Ulcer] [2:Venous Leg Ulcer] [N/A:N/A] Comorbid History: [1:Asthma, Osteoarthritis, Seizure Disorder] [2:Asthma, Osteoarthritis, Seizure Disorder] [N/A:N/A] Date Acquired: [1:03/08/2018] [2:09/30/2018] [N/A:N/A] Weeks of Treatment: [1:8] [2:1] [N/A:N/A] Wound Status: [1:Open] [2:Open] [N/A:N/A] Measurements  L x W x D [1:8.5x3.4x0.1] [2:1x1.3x0.1] [N/A:N/A] (cm) Area (cm) : [1:22.698] [2:1.021] [N/A:N/A] Volume (cm) : [1:2.27] [2:0.102] [N/A:N/A] % Reduction in Area: [1:-129.40%] [2:42.00%] [N/A:N/A] % Reduction in Volume: [1:-14.70%] [2:42.00%] [N/A:N/A] Classification: [1:Full Thickness Without Exposed Support Structures] [2:Full Thickness Without Exposed Support Structures] [N/A:N/A] Exudate Amount: [1:Large] [2:Large] [N/A:N/A] Exudate Type: [1:Serous] [2:Serous] [N/A:N/A] Exudate Color: [1:amber] [2:amber] [N/A:N/A] Wound Margin: [1:Flat and Intact] [2:Flat and Intact] [N/A:N/A] Granulation Amount: [1:Medium (34-66%)] [2:Large (67-100%)] [N/A:N/A] Granulation Quality: [1:Red, Hyper-granulation] [2:Red] [N/A:N/A] Necrotic Amount: [1:Medium (34-66%)] [2:None Present (0%)] [N/A:N/A] Exposed Structures: [1:Fat Layer (Subcutaneous Tissue) Exposed: Yes Fascia: No Tendon: No Muscle: No Joint: No Bone: No] [2:Fat Layer (Subcutaneous Tissue) Exposed: Yes Fascia: No Tendon: No Muscle: No Joint: No Bone: No]  [N/A:N/A] Epithelialization: [1:Small (1-33%)] [2:None] [N/A:N/A] Periwound Skin Texture: [1:Excoriation: Yes Induration: No Callus: No Crepitus: No] [2:Excoriation: No Induration: No Callus: No Crepitus: No] [N/A:N/A] Rash: No Rash: No Scarring: No Scarring: No Periwound Skin Moisture: Maceration: Yes Maceration: No N/A Dry/Scaly: No Dry/Scaly: No Periwound Skin Color: Erythema: Yes Atrophie Blanche: No N/A Hemosiderin Staining: Yes Cyanosis: No Atrophie Blanche: No Ecchymosis: No Cyanosis: No Erythema: No Ecchymosis: No Hemosiderin Staining: No Mottled: No Mottled: No Pallor: No Pallor: No Rubor: No Rubor: No Erythema Location: Circumferential N/A N/A Erythema Measurement: Measured N/A N/A Temperature: No Abnormality No Abnormality N/A Tenderness on Palpation: Yes Yes N/A Wound Preparation: Ulcer Cleansing: Other: soap Ulcer Cleansing: N/A and water Rinsed/Irrigated with Saline Topical Anesthetic Applied: Topical Anesthetic Applied: Other: lidocaine 4% Other: lidocaine 4% Treatment Notes Electronic Signature(s) Signed: 10/12/2018 1:37:43 PM By: Arnette Norris Entered By: Arnette Norris on 10/08/2018 16:18:30 Johnathan Scott ([id number]) -------------------------------------------------------------------------------- Multi-Disciplinary Care Plan Details Patient Name: Johnathan Scott Date of Service: 10/08/2018 3:30 PM Medical Record Number: [id number] Patient Account Number: [id number] Date of Birth/Sex: 09/26/1966 (52 y.o. M) Treating RN: Arnette Norris Primary Care Alaylah Heatherington: Birdie Sons, ERIC Other Clinician: Referring Maybell Misenheimer: Birdie Sons, ERIC Treating Braxson Hollingsworth/Extender: Linwood Dibbles, HOYT Weeks in Treatment: 8 Active Inactive Necrotic Tissue Nursing Diagnoses: Knowledge deficit related to management of necrotic/devitalized tissue Goals: Necrotic/devitalized tissue will be minimized in the wound bed Date Initiated: 08/09/2018 Target Resolution Date:  09/09/2018 Goal Status: Active Interventions: Assess patient pain level pre-, during and post procedure and prior to discharge Treatment Activities: Apply topical anesthetic as ordered : 08/09/2018 Excisional debridement : 08/09/2018 Notes: Orientation to the Wound Care Program Nursing Diagnoses: Knowledge deficit related to the wound healing center program Goals: Patient/caregiver will verbalize understanding of the Wound Healing Center Program Date Initiated: 08/09/2018 Target Resolution Date: 09/09/2018 Goal Status: Active Interventions: Provide education on orientation to the wound center Notes: Wound/Skin Impairment Nursing Diagnoses: Impaired tissue integrity Knowledge deficit related to smoking impact on wound healing Goals: Ulcer/skin breakdown will have a volume reduction of 30% by week 4 DANYL, DEEMS ([id number]) Date Initiated: 08/09/2018 Target Resolution Date: 09/09/2018 Goal Status: Active Interventions: Assess patient/caregiver ability to obtain necessary supplies Assess ulceration(s) every visit Treatment Activities: Topical wound management initiated : 08/09/2018 Notes: Electronic Signature(s) Signed: 10/12/2018 1:37:43 PM By: Arnette Norris Entered By: Arnette Norris on 10/08/2018 16:18:07 Johnathan Scott ([id number]) -------------------------------------------------------------------------------- Pain Assessment Details Patient Name: Johnathan Scott Date of Service: 10/08/2018 3:30 PM Medical Record Number: [id number] Patient Account Number: [id number] Date of Birth/Sex: 12/05/1965 (52 y.o. M) Treating RN: Curtis Sites Primary Care Yee Gangi: Birdie Sons, ERIC Other Clinician: Referring Sumeet Geter: Birdie Sons, ERIC Treating Kenneshia Rehm/Extender: Linwood Dibbles, HOYT Weeks in Treatment: 8 Active Problems Location of Pain Severity and Description of Pain Patient Has Paino Yes Site Locations  Pain Location: Pain in Ulcers With Dressing Change:  Yes Duration of the Pain. Constant / Intermittento Constant Character of Pain Describe the Pain: Other: stings Pain Management and Medication Current Pain Management: Electronic Signature(s) Signed: 10/08/2018 5:12:41 PM By: Curtis Sites Entered By: Curtis Sites on 10/08/2018 15:42:04 Johnathan Scott ([id number]) -------------------------------------------------------------------------------- Patient/Caregiver Education Details Patient Name: Johnathan Scott Date of Service: 10/08/2018 3:30 PM Medical Record Number: [id number] Patient Account Number: [id number] Date of Birth/Gender: Dec 05, 1965 (52 y.o. M) Treating RN: Arnette Norris Primary Care Physician: Birdie Sons, ERIC Other Clinician: Referring Physician: Birdie Sons, ERIC Treating Physician/Extender: Skeet Simmer in Treatment: 8 Education Assessment Education Provided To: Patient Education Topics Provided Electronic Signature(s) Signed: 10/12/2018 1:37:43 PM By: Arnette Norris Entered By: Arnette Norris on 10/08/2018 16:18:38 Johnathan Scott ([id number]) -------------------------------------------------------------------------------- Wound Assessment Details Patient Name: Johnathan Scott Date of Service: 10/08/2018 3:30 PM Medical Record Number: [id number] Patient Account Number: [id number] Date of Birth/Sex: 1965-12-26 (52 y.o. M) Treating RN: Curtis Sites Primary Care Libero Puthoff: Birdie Sons, ERIC Other Clinician: Referring Deondrea Markos: Birdie Sons, ERIC Treating Yacob Wilkerson/Extender: Linwood Dibbles, HOYT Weeks in Treatment: 8 Wound Status Wound Number: 1 Primary Etiology: Venous Leg Ulcer Wound Location: Left Lower Leg - Anterior Wound Status: Open Wounding Event: Gradually Appeared Comorbid History: Asthma, Osteoarthritis, Seizure Disorder Date Acquired: 03/08/2018 Weeks Of Treatment: 8 Clustered Wound: No Wound Measurements Length: (cm) 8.5 Width: (cm) 3.4 Depth: (cm) 0.1 Area: (cm) 22.698 Volume:  (cm) 2.27 % Reduction in Area: -129.4% % Reduction in Volume: -14.7% Epithelialization: Small (1-33%) Tunneling: No Undermining: No Wound Description Full Thickness Without Exposed Support Classification: Structures Wound Margin: Flat and Intact Exudate Large Amount: Exudate Type: Serous Exudate Color: amber Foul Odor After Cleansing: No Slough/Fibrino Yes Wound Bed Granulation Amount: Medium (34-66%) Exposed Structure Granulation Quality: Red, Hyper-granulation Fascia Exposed: No Necrotic Amount: Medium (34-66%) Fat Layer (Subcutaneous Tissue) Exposed: Yes Necrotic Quality: Adherent Slough Tendon Exposed: No Muscle Exposed: No Joint Exposed: No Bone Exposed: No Periwound Skin Texture Texture Color No Abnormalities Noted: No No Abnormalities Noted: No Callus: No Atrophie Blanche: No Crepitus: No Cyanosis: No Excoriation: Yes Ecchymosis: No Induration: No Erythema: Yes Rash: No Erythema Location: Circumferential Scarring: No Erythema Measurement: Measured Hemosiderin Staining: Yes Moisture Mottled: No No Abnormalities Noted: No Pallor: No Dry / Scaly: No [place], Jamis ([id number]) Maceration: Yes Rubor: No Temperature / Pain Temperature: No Abnormality Tenderness on Palpation: Yes Wound Preparation Ulcer Cleansing: Other: soap and water, Topical Anesthetic Applied: Other: lidocaine 4%, Electronic Signature(s) Signed: 10/08/2018 5:12:41 PM By: Curtis Sites Entered By: Curtis Sites on 10/08/2018 15:48:45 Johnathan Scott ([id number]) -------------------------------------------------------------------------------- Wound Assessment Details Patient Name: Johnathan Scott Date of Service: 10/08/2018 3:30 PM Medical Record Number: [id number] Patient Account Number: [id number] Date of Birth/Sex: 01-11-1966 (52 y.o. M) Treating RN: Curtis Sites Primary Care Johnathan Scott: Birdie Sons, ERIC Other Clinician: Referring Lynsey Ange: Birdie Sons, ERIC Treating  Deontrey Massi/Extender: Linwood Dibbles, HOYT Weeks in Treatment: 8 Wound Status Wound Number: 2 Primary Etiology: Venous Leg Ulcer Wound Location: Left Lower Leg - Lateral Wound Status: Open Wounding Event: Gradually Appeared Comorbid History: Asthma, Osteoarthritis, Seizure Disorder Date Acquired: 09/30/2018 Weeks Of Treatment: 1 Clustered Wound: No Wound Measurements Length: (cm) 1 Width: (cm) 1.3 Depth: (cm) 0.1 Area: (cm) 1.021 Volume: (cm) 0.102 % Reduction in Area: 42% % Reduction in Volume: 42% Epithelialization: None Tunneling: No Undermining: No Wound Description Full Thickness Without Exposed Support Foul Odo Classification: Structures Slough/F Wound Margin: Flat and Intact Exudate Large Amount: Exudate Type: Serous Exudate Color: amber r After Cleansing: No ibrino No Wound Bed Granulation  Amount: Large (67-100%) Exposed Structure Granulation Quality: Red Fascia Exposed: No Necrotic Amount: None Present (0%) Fat Layer (Subcutaneous Tissue) Exposed: Yes Tendon Exposed: No Muscle Exposed: No Joint Exposed: No Bone Exposed: No Periwound Skin Texture Texture Color No Abnormalities Noted: No No Abnormalities Noted: No Callus: No Atrophie Blanche: No Crepitus: No Cyanosis: No Excoriation: No Ecchymosis: No Induration: No Erythema: No Rash: No Hemosiderin Staining: No Scarring: No Mottled: No Pallor: No Moisture Rubor: No No Abnormalities Noted: No Dry / Scaly: No Temperature / Pain Rail, Johnathan Scott ([id number]) Maceration: No Temperature: No Abnormality Tenderness on Palpation: Yes Wound Preparation Ulcer Cleansing: Rinsed/Irrigated with Saline Topical Anesthetic Applied: Other: lidocaine 4%, Electronic Signature(s) Signed: 10/08/2018 5:12:41 PM By: Curtis Sitesorthy, Joanna Entered By: Curtis Sitesorthy, Joanna on 10/08/2018 15:48:58 Johnathan Scott, Jawara ([id number]) -------------------------------------------------------------------------------- Vitals  Details Patient Name: Johnathan Scott, Jaevin Date of Service: 10/08/2018 3:30 PM Medical Record Number: [id number] Patient Account Number: [id number] Date of Birth/Sex: 1966-03-18 (52 y.o. M) Treating RN: Curtis Sitesorthy, Joanna Primary Care Madaline Lefeber: Birdie SonsSONNENBERG, ERIC Other Clinician: Referring Cotey Rakes: Birdie SonsSONNENBERG, ERIC Treating Jassiel Flye/Extender: Linwood DibblesSTONE III, HOYT Weeks in Treatment: 8 Vital Signs Time Taken: 15:46 Temperature (F): 97.6 Height (in): 61 Pulse (bpm): 77 Weight (lbs): 232 Respiratory Rate (breaths/min): 16 Body Mass Index (BMI): 43.8 Blood Pressure (mmHg): 130/70 Reference Range: 80 - 120 mg / dl Electronic Signature(s) Signed: 10/08/2018 5:12:41 PM By: Curtis Sitesorthy, Joanna Entered By: Curtis Sitesorthy, Joanna on 10/08/2018 15:46:25

## 2018-10-13 NOTE — Progress Notes (Signed)
BENJAMAN, ARTMAN ([id number]) Visit Report for 10/12/2018 Chief Complaint Document Details Patient Name: Johnathan Scott, Johnathan Scott Date of Service: 10/12/2018 11:00 AM Medical Record Number: [id number] Patient Account Number: [id number] Date of Birth/Sex: 14-Nov-1965 (52 y.o. M) Treating RN: Curtis Sites Primary Care Provider: Birdie Sons, ERIC Other Clinician: Referring Provider: Birdie Sons, ERIC Treating Provider/Extender: Linwood Dibbles, HOYT Weeks in Treatment: 9 Information Obtained from: Patient Chief Complaint Left Anterior LE Ulcer Electronic Signature(s) Signed: 10/12/2018 1:28:21 PM By: Lenda Kelp PA-C Entered By: Lenda Kelp on 10/12/2018 12:13:07 Johnathan Scott ([id number]) -------------------------------------------------------------------------------- Debridement Details Patient Name: Johnathan Scott Date of Service: 10/12/2018 11:00 AM Medical Record Number: [id number] Patient Account Number: [id number] Date of Birth/Sex: 1966/10/10 (52 y.o. M) Treating RN: Curtis Sites Primary Care Provider: Birdie Sons, ERIC Other Clinician: Referring Provider: Birdie Sons, ERIC Treating Provider/Extender: Linwood Dibbles, HOYT Weeks in Treatment: 9 Debridement Performed for Wound #1 Left,Anterior Lower Leg Assessment: Performed By: Physician STONE III, HOYT E., PA-C Debridement Type: Debridement Severity of Tissue Pre Fat layer exposed Debridement: Level of Consciousness (Pre- Awake and Alert procedure): Pre-procedure Verification/Time Yes - 12:17 Out Taken: Start Time: 12:17 Pain Control: Lidocaine 4% Topical Solution Total Area Debrided (L x W): 6 (cm) x 3.2 (cm) = 19.2 (cm) Tissue and other material Viable, Non-Viable, Slough, Subcutaneous, Slough debrided: Level: Skin/Subcutaneous Tissue Debridement Description: Excisional Instrument: Curette Bleeding: Minimum Hemostasis Achieved: Pressure End Time: 12:21 Procedural Pain: 0 Post Procedural Pain: 0 Response to  Treatment: Procedure was tolerated well Level of Consciousness Awake and Alert (Post-procedure): Post Debridement Measurements of Total Wound Length: (cm) 6 Width: (cm) 3.2 Depth: (cm) 0.2 Volume: (cm) 3.016 Character of Wound/Ulcer Post Debridement: Improved Severity of Tissue Post Debridement: Fat layer exposed Post Procedure Diagnosis Same as Pre-procedure Electronic Signature(s) Signed: 10/12/2018 1:28:21 PM By: Lenda Kelp PA-C Signed: 10/12/2018 1:47:23 PM By: Curtis Sites Entered By: Curtis Sites on 10/12/2018 12:21:57 Johnathan Scott ([id number]) -------------------------------------------------------------------------------- HPI Details Patient Name: Johnathan Scott Date of Service: 10/12/2018 11:00 AM Medical Record Number: [id number] Patient Account Number: [id number] Date of Birth/Sex: 11-05-1965 (52 y.o. M) Treating RN: Curtis Sites Primary Care Provider: Birdie Sons, ERIC Other Clinician: Referring Provider: Birdie Sons, ERIC Treating Provider/Extender: Linwood Dibbles, HOYT Weeks in Treatment: 9 History of Present Illness HPI Description: 08/09/18 on evaluation today patient presents for initial evaluation and clinic as referral concerning an ulcer that he has on the left anterior lower Trinity which has been present for five months. He states that he actually noticed this after having an "insect bite" while driving down the road. He states that he did not see what kind of insect it was but he felt it wrong up his leg and then have the bite. Since that time is been having issues with the fact that this area does not want to heal. He does have a history of asthma as well as anemia is most recent CBC revealed a hemoglobin of 11.1 with a hematocrit of 34.6. He does have a history of panic attacks as well intermittently. No fevers, chills, nausea, or vomiting noted at this time. Specifically the patient does not appear to have any infection in regard to the left  lower extremity. He's not on the current antibiotics. 08/16/18 upon evaluation today patient actually appears to be doing significantly better in regard to his wound. He has been tolerating the dressing changes that he was performing on his own over the last week. Subsequently we did not wrap his leg at that time although we discussed it. We weren't sure that he was  gonna be able to get his pants on top of the dressing. Nonetheless the patient fortunately came today with shorts on he states that he is willing to be wrapped if it will help the wound to heal faster. He is pleased with the fact that the wound already appears to be doing better it's also not hurting as badly. The last debridement was a little bit more uncomfortable for him unfortunately. Nonetheless he states he's willing to allow me to debride it again today if I do so carefully. 08/24/18 upon evaluation today patient actually appears to be doing rather well in regard to his lower Trinity ulcer on the left. He has been tolerating the dressing changes with Iodoflex including the wrap without complication and the wound [place] into making signs of good progress. Overall I'm very pleased with how things appear at this time. No fevers, chills, nausea, or vomiting noted at this time. 09/03/18 on evaluation today patient actually appears to be doing rather well in regard to his wound. In fact the overall measurement belies the fact that the wound is actually healed quite a bit more than what the measurement would suggest. In fact it's almost half the size that it was during the last evaluation unfortunately a lot of the area where new skin has grown over is in between two areas that had to be clustered together one of the distal portion of the wound has not completely healed up. Nonetheless there is actually quite a bit more healing than what seems to be represented by the wound measurement today. Overall I'm very pleased with the overall  progress and how he seems to be doing with the dressing changes. The wrap seems to be of great benefit for him as well. 09/09/18 upon evaluation today patient actually appears to be doing excellent in regard to his left lotion the ulcer. He has been tolerating the dressing changes without complication. There does not appear to be any evidence of infection overall I think the [facility] Dressing has done excellent for him. I'm very pleased. 09/23/18 evaluation today patient actually appears to be doing poorly in regard to his lower extremity ulcer. There's a lot of maceration and skin breakdown around the actual wound itself which is making it difficult to even tell exactly what is going on. This is something he states started about four days ago. Nonetheless I'm concerned about the possibility of infection just being that he was not having near this amount of drainage previous. He also states in the past 24 hours the pain has been a little bit more significant. No fevers, chills, nausea, or vomiting noted at this time. 09/30/18 on evaluation today patient actually appears to be doing poorly in regard to his lower extremity ulcer. He's been tolerating the dressing changes although there is a lot of tape irritation surrounding the wound area. Subsequently I do think that he would benefit from [place] initiating treatment with all the compression wrap as we did before to avoid any additional injury to the area. No fevers, chills, nausea, or vomiting noted at this time. The patient did have a return of his wound culture which showed Pseudomonas which will not be covered by the Bactrim that I previously prescribed for him. With that being said the patient states that he did not know I was sitting in a prescription for him and therefore never picked this up he also states the pharmacy did not call him. Nonetheless this was something that was sent we will  call the pharmacy and having counseling as OJAS, COONE ([id number]) I'm gonna have to send them something different. 10/08/18 on evaluation today patient's ulcer still appears to be much larger than what it was previous to the deterioration. With that being said the erythema surrounding the wound bed is not nearly as significant I do believe the antibiotic has been of help for him. He did finally get the Levaquin which is good news. No fevers, chills, nausea, or vomiting noted at this time. 10/12/18 on evaluation today patient actually appears to be doing much better in regard to his lower extremity ulcer. Overall I do believe the dressing change has done well for him at this time. I'm pleased with the overall progress even since just in the last week. Electronic Signature(s) Signed: 10/12/2018 1:28:21 PM By: Lenda Kelp PA-C Entered By: Lenda Kelp on 10/12/2018 12:56:41 Johnathan Scott ([id number]) -------------------------------------------------------------------------------- Physical Exam Details Patient Name: Johnathan Scott Date of Service: 10/12/2018 11:00 AM Medical Record Number: [id number] Patient Account Number: [id number] Date of Birth/Sex: 14-Dec-1965 (52 y.o. M) Treating RN: Curtis Sites Primary Care Provider: Birdie Sons, ERIC Other Clinician: Referring Provider: Birdie Sons, ERIC Treating Provider/Extender: STONE III, HOYT Weeks in Treatment: 9 Constitutional Well-nourished and well-hydrated in no acute distress. Respiratory normal breathing without difficulty. Psychiatric this patient is able to make decisions and demonstrates good insight into disease process. Alert and Oriented x 3. pleasant and cooperative. Notes Patient's wound bed did have some Slough noted on the surface of the wound which did require sharp debridement today. He tolerated this without complication and post debridement the wound bed appears to be doing much better and is greatly improved which is great news. Electronic  Signature(s) Signed: 10/12/2018 1:28:21 PM By: Lenda Kelp PA-C Entered By: Lenda Kelp on 10/12/2018 12:57:11 Johnathan Scott ([id number]) -------------------------------------------------------------------------------- Physician Orders Details Patient Name: Johnathan Scott Date of Service: 10/12/2018 11:00 AM Medical Record Number: [id number] Patient Account Number: [id number] Date of Birth/Sex: November 18, 1965 (52 y.o. M) Treating RN: Curtis Sites Primary Care Provider: Birdie Sons, ERIC Other Clinician: Referring Provider: Birdie Sons, ERIC Treating Provider/Extender: Linwood Dibbles, HOYT Weeks in Treatment: 9 Verbal / Phone Orders: No Diagnosis Coding ICD-10 Coding Code Description I87.2 Venous insufficiency (chronic) (peripheral) L97.822 Non-pressure chronic ulcer of other part of left lower leg with fat layer exposed D50.9 Iron deficiency anemia, unspecified J45.998 Other asthma Wound Cleansing Wound #1 Left,Anterior Lower Leg o Clean wound with Normal Saline. o May Shower, gently pat wound dry prior to applying new dressing. Anesthetic (add to Medication List) Wound #1 Left,Anterior Lower Leg o Topical Lidocaine 4% cream applied to wound bed prior to debridement (In Clinic Only). Primary Wound Dressing Wound #1 Left,Anterior Lower Leg o Silver Alginate Secondary Dressing Wound #1 Left,Anterior Lower Leg o XtraSorb Dressing Change Frequency Wound #1 Left,Anterior Lower Leg o Change dressing every week Follow-up Appointments Wound #1 Left,Anterior Lower Leg o Return Appointment in 1 week. Edema Control Wound #1 Left,Anterior Lower Leg o 3 Layer Compression System - Left Lower Extremity o Elevate legs to the level of the heart and pump ankles as often as possible Off-Loading Wound #1 Left,Anterior Lower Leg o Turn and reposition every 2 hours LYNDA, CAPISTRAN ([id number]) Electronic Signature(s) Signed: 10/12/2018 1:28:21 PM By: Lenda Kelp  PA-C Signed: 10/12/2018 1:47:23 PM By: Curtis Sites Entered By: Curtis Sites on 10/12/2018 12:24:11 Johnathan Scott ([id number]) -------------------------------------------------------------------------------- Problem List Details Patient Name: Johnathan Scott Date of Service: 10/12/2018 11:00 AM Medical Record Number: [id number] Patient Account Number: [id number] Date  of Birth/Sex: 11-Oct-1966 (52 y.o. M) Treating RN: Curtis Sitesorthy, Joanna Primary Care Provider: Birdie SonsSONNENBERG, ERIC Other Clinician: Referring Provider: Birdie SonsSONNENBERG, ERIC Treating Provider/Extender: Linwood DibblesSTONE III, HOYT Weeks in Treatment: 9 Active Problems ICD-10 Evaluated Encounter Code Description Active Date Today Diagnosis I87.2 Venous insufficiency (chronic) (peripheral) 08/09/2018 No Yes L97.822 Non-pressure chronic ulcer of other part of left lower leg with 08/09/2018 No Yes fat layer exposed D50.9 Iron deficiency anemia, unspecified 08/09/2018 No Yes J45.998 Other asthma 08/09/2018 No Yes Inactive Problems Resolved Problems Electronic Signature(s) Signed: 10/12/2018 1:28:21 PM By: Lenda KelpStone III, Hoyt PA-C Entered By: Lenda KelpStone III, Hoyt on 10/12/2018 12:13:01 Johnathan SextonSHAMBLEY, Franklin ([id number]) -------------------------------------------------------------------------------- Progress Note Details Patient Name: Johnathan SextonSHAMBLEY, Luisenrique Date of Service: 10/12/2018 11:00 AM Medical Record Number: [id number] Patient Account Number: [id number] Date of Birth/Sex: 11-Oct-1966 (52 y.o. M) Treating RN: Curtis Sitesorthy, Joanna Primary Care Provider: Birdie SonsSONNENBERG, ERIC Other Clinician: Referring Provider: Birdie SonsSONNENBERG, ERIC Treating Provider/Extender: Linwood DibblesSTONE III, HOYT Weeks in Treatment: 9 Subjective Chief Complaint Information obtained from Patient Left Anterior LE Ulcer History of Present Illness (HPI) 08/09/18 on evaluation today patient presents for initial evaluation and clinic as referral concerning an ulcer that he has on the left anterior lower Trinity  which has been present for five months. He states that he actually noticed this after having an "insect bite" while driving down the road. He states that he did not see what kind of insect it was but he felt it wrong up his leg and then have the bite. Since that time is been having issues with the fact that this area does not want to heal. He does have a history of asthma as well as anemia is most recent CBC revealed a hemoglobin of 11.1 with a hematocrit of 34.6. He does have a history of panic attacks as well intermittently. No fevers, chills, nausea, or vomiting noted at this time. Specifically the patient does not appear to have any infection in regard to the left lower extremity. He's not on the current antibiotics. 08/16/18 upon evaluation today patient actually appears to be doing significantly better in regard to his wound. He has been tolerating the dressing changes that he was performing on his own over the last week. Subsequently we did not wrap his leg at that time although we discussed it. We weren't sure that he was gonna be able to get his pants on top of the dressing. Nonetheless the patient fortunately came today with shorts on he states that he is willing to be wrapped if it will help the wound to heal faster. He is pleased with the fact that the wound already appears to be doing better it's also not hurting as badly. The last debridement was a little bit more uncomfortable for him unfortunately. Nonetheless he states he's willing to allow me to debride it again today if I do so carefully. 08/24/18 upon evaluation today patient actually appears to be doing rather well in regard to his lower Trinity ulcer on the left. He has been tolerating the dressing changes with Iodoflex including the wrap without complication and the wound us into making signs of good progress. Overall I'm very pleased with how things appear at this time. No fevers, chills, nausea, or vomiting noted at this  time. 09/03/18 on evaluation today patient actually appears to be doing rather well in regard to his wound. In fact the overall measurement belies the fact that the wound is actually healed quite a bit more than what the measurement would suggest. In fact it's almost half the size  that it was during the last evaluation unfortunately a lot of the area where new skin has grown over is in between two areas that had to be clustered together one of the distal portion of the wound has not completely healed up. Nonetheless there is actually quite a bit more healing than what seems to be represented by the wound measurement today. Overall I'm very pleased with the overall progress and how he seems to be doing with the dressing changes. The wrap seems to be of great benefit for him as well. 09/09/18 upon evaluation today patient actually appears to be doing excellent in regard to his left lotion the ulcer. He has been tolerating the dressing changes without complication. There does not appear to be any evidence of infection overall I think the [facility] has done excellent for him. I'm very pleased. 09/23/18 evaluation today patient actually appears to be doing poorly in regard to his lower extremity ulcer. There's a lot of maceration and skin breakdown around the actual wound itself which is making it difficult to even tell exactly what is going on. This is something he states started about four days ago. Nonetheless I'm concerned about the possibility of infection just being that he was not having near this amount of drainage previous. He also states in the past 24 hours the pain has been a little bit more significant. No fevers, chills, nausea, or vomiting noted at this time. 09/30/18 on evaluation today patient actually appears to be doing poorly in regard to his lower extremity ulcer. He's been tolerating the dressing changes although there is a lot of tape irritation surrounding the wound  area. Subsequently I do think [facility] Surgery[facility], Venson ([id number]) that he would benefit from us initiating treatment with all the compression wrap as we did before to avoid any additional injury to the area. No fevers, chills, nausea, or vomiting noted at this time. The patient did have a return of his wound culture which showed Pseudomonas which will not be covered by the Bactrim that I previously prescribed for him. With that being said the patient states that he did not know I was sitting in a prescription for him and therefore never picked this up he also states the pharmacy did not call him. Nonetheless this was something that was sent we will call the pharmacy and having counseling as I'm gonna have to send them something different. 10/08/18 on evaluation today patient's ulcer still appears to be much larger than what it was previous to the deterioration. With that being said the erythema surrounding the wound bed is not nearly as significant I do believe the antibiotic has been of help for him. He did finally get the Levaquin which is good news. No fevers, chills, nausea, or vomiting noted at this time. 10/12/18 on evaluation today patient actually appears to be doing much better in regard to his lower extremity ulcer. Overall I do believe the dressing change has done well for him at this time. I'm pleased with the overall progress even since just in the last week. Patient History Information obtained from Patient. Family History Cancer - Mother,Father,Siblings, Diabetes - Siblings, Heart Disease - Siblings, Hypertension - Siblings, No family history of Hereditary Spherocytosis, Kidney Disease, Lung Disease, Seizures, Stroke, Thyroid Problems, Tuberculosis. Social History Former smoker - 7 years, Marital Status - Single, Alcohol Use - Rarely, Drug Use - No History, Caffeine Use - Never. Review of Systems (ROS) Constitutional Symptoms (General Health) Denies complaints or symptoms of Fever,  Chills. Respiratory The patient has no complaints or symptoms. Cardiovascular Complains or has symptoms of LE edema. Psychiatric The patient has no complaints or symptoms. Objective Constitutional Well-nourished and well-hydrated in no acute distress. Vitals Time Taken: 11:21 AM, Height: 61 in, Weight: 232 lbs, BMI: 43.8, Temperature: 97.6 F, Pulse: 63 bpm, Respiratory Rate: 16 breaths/min, Blood Pressure: 147/70 mmHg. Respiratory normal breathing without difficulty. Psychiatric this patient is able to make decisions and demonstrates good insight into disease process. Alert and Oriented x 3. pleasant LORIS, WINROW ([id number]) and cooperative. General Notes: Patient's wound bed did have some Slough noted on the surface of the wound which did require sharp debridement today. He tolerated this without complication and post debridement the wound bed appears to be doing much better and is greatly improved which is great news. Integumentary (Hair, Skin) Wound #1 status is Open. Original cause of wound was Gradually Appeared. The wound is located on the Left,Anterior Lower Leg. The wound measures 6cm length x 3.2cm width x 0.1cm depth; 15.08cm^2 area and 1.508cm^3 volume. There is Fat Layer (Subcutaneous Tissue) Exposed exposed. There is no tunneling or undermining noted. There is a large amount of serous drainage noted. The wound margin is flat and intact. There is small (1-33%) red granulation within the wound bed. There is a large (67-100%) amount of necrotic tissue within the wound bed including Adherent Slough. The periwound skin appearance exhibited: Maceration, Hemosiderin Staining. The periwound skin appearance did not exhibit: Callus, Crepitus, Excoriation, Induration, Rash, Scarring, Dry/Scaly, Atrophie Blanche, Cyanosis, Ecchymosis, Mottled, Pallor, Rubor, Erythema. Periwound temperature was noted as No Abnormality. The periwound has tenderness on palpation. Wound #2 status is  Healed - Epithelialized. Original cause of wound was Gradually Appeared. The wound is located on the Left,Lateral Lower Leg. The wound measures 0cm length x 0cm width x 0cm depth; 0cm^2 area and 0cm^3 volume. There is no tunneling or undermining noted. There is a large amount of serous drainage noted. The wound margin is flat and intact. There is no granulation within the wound bed. There is no necrotic tissue within the wound bed. The periwound skin appearance did not exhibit: Callus, Crepitus, Excoriation, Induration, Rash, Scarring, Dry/Scaly, Maceration, Atrophie Blanche, Cyanosis, Ecchymosis, Hemosiderin Staining, Mottled, Pallor, Rubor, Erythema. Periwound temperature was noted as No Abnormality. The periwound has tenderness on palpation. Assessment Active Problems ICD-10 Venous insufficiency (chronic) (peripheral) Non-pressure chronic ulcer of other part of left lower leg with fat layer exposed Iron deficiency anemia, unspecified Other asthma Procedures Wound #1 Pre-procedure diagnosis of Wound #1 is a Venous Leg Ulcer located on the Left,Anterior Lower Leg .Severity of Tissue Pre Debridement is: Fat layer exposed. There was a Excisional Skin/Subcutaneous Tissue Debridement with a total area of 19.2 sq cm performed by STONE III, HOYT E., PA-C. With the following instrument(s): Curette to remove Viable and Non-Viable tissue/material. Material removed includes Subcutaneous Tissue and Slough and after achieving pain control using Lidocaine 4% Topical Solution. No specimens were taken. A time out was conducted at 12:17, prior to the start of the procedure. A Minimum amount of bleeding was controlled with Pressure. The procedure was tolerated well with a pain level of 0 throughout and a pain level of 0 following the procedure. Post Debridement Measurements: 6cm length x 3.2cm width x 0.2cm depth; 3.016cm^3 volume. Character of Wound/Ulcer Post Debridement is improved. Severity of Tissue  Post Debridement is: Fat layer exposed. Post procedure Diagnosis Wound #1: Same as Pre-Procedure DAM, ASHRAF ([id number]) Plan Wound Cleansing: Wound #1 Left,Anterior Lower Leg:  Clean wound with Normal Saline. May Shower, gently pat wound dry prior to applying new dressing. Anesthetic (add to Medication List): Wound #1 Left,Anterior Lower Leg: Topical Lidocaine 4% cream applied to wound bed prior to debridement (In Clinic Only). Primary Wound Dressing: Wound #1 Left,Anterior Lower Leg: Silver Alginate Secondary Dressing: Wound #1 Left,Anterior Lower Leg: XtraSorb Dressing Change Frequency: Wound #1 Left,Anterior Lower Leg: Change dressing every week Follow-up Appointments: Wound #1 Left,Anterior Lower Leg: Return Appointment in 1 week. Edema Control: Wound #1 Left,Anterior Lower Leg: 3 Layer Compression System - Left Lower Extremity Elevate legs to the level of the heart and pump ankles as often as possible Off-Loading: Wound #1 Left,Anterior Lower Leg: Turn and reposition every 2 hours I'm gonna suggest currently that we continue with the above wound care measures for the next week. The patient is in agreement the plan. We will subsequently see were things stand at follow-up. Please see above for specific wound care orders. We will see patient for re-evaluation in 1 week(s) here in the clinic. If anything worsens or changes patient will contact our office for additional recommendations. Electronic Signature(s) Signed: 10/12/2018 1:28:21 PM By: Lenda Kelp PA-C Entered By: Lenda Kelp on 10/12/2018 12:57:25 Johnathan Scott ([id number]) -------------------------------------------------------------------------------- ROS/PFSH Details Patient Name: Johnathan Scott Date of Service: 10/12/2018 11:00 AM Medical Record Number: [id number] Patient Account Number: [id number] Date of Birth/Sex: [birth year] (52 y.o. M) Treating RN: Curtis Sites Primary Care Provider:  Birdie Sons, ERIC Other Clinician: Referring Provider: Birdie Sons, ERIC Treating Provider/Extender: Linwood Dibbles, HOYT Weeks in Treatment: 9 Information Obtained From Patient Wound History Do you currently have one or more open woundso Yes How many open wounds do you currently haveo 1 Approximately how long have you had your woundso 5 months How have you been treating your wound(s) until nowo open to air Has your wound(s) ever healed and then re-openedo No Have you had any lab work done in the past montho No Have you tested positive for an antibiotic resistant organism (MRSA, VRE)o No Have you tested positive for osteomyelitis (bone infection)o No Have you had any tests for circulation on your legso No Have you had other problems associated with your woundso Swelling Constitutional Symptoms (General Health) Complaints and Symptoms: Negative for: Fever; Chills Cardiovascular Complaints and Symptoms: Positive for: LE edema Medical History: Negative for: Angina; Arrhythmia; Congestive Heart Failure; Coronary Artery Disease; Deep Vein Thrombosis; Hypertension; Hypotension; Myocardial Infarction; Peripheral Arterial Disease; Peripheral Venous Disease; Phlebitis; Vasculitis Eyes Medical History: Negative for: Cataracts; Glaucoma; Optic Neuritis Ear/Nose/Mouth/Throat Medical History: Negative for: Chronic sinus problems/congestion; Middle ear problems Hematologic/Lymphatic Medical History: Negative for: Anemia; Hemophilia; Human Immunodeficiency Virus; Lymphedema; Sickle Cell Disease Respiratory Complaints and Symptoms: No Complaints or Symptoms DENNY, MCCREE ([id number]) Medical History: Positive for: Asthma - history as a child Negative for: Aspiration; Chronic Obstructive Pulmonary Disease (COPD); Pneumothorax; Sleep Apnea; Tuberculosis Gastrointestinal Medical History: Negative for: Cirrhosis ; Colitis; Crohnos; Hepatitis A; Hepatitis B; Hepatitis C Endocrine Medical  History: Negative for: Type I Diabetes; Type II Diabetes Genitourinary Medical History: Negative for: End Stage Renal Disease Immunological Medical History: Negative for: Lupus Erythematosus; Raynaudos; Scleroderma Integumentary (Skin) Medical History: Negative for: History of Burn; History of pressure wounds Musculoskeletal Medical History: Positive for: Osteoarthritis - both hips Negative for: Gout; Rheumatoid Arthritis; Osteomyelitis Neurologic Medical History: Positive for: Seizure Disorder - as a child Negative for: Dementia; Neuropathy; Quadriplegia; Paraplegia Oncologic Medical History: Negative for: Received Chemotherapy; Received Radiation Psychiatric Complaints and Symptoms: No Complaints or Symptoms Medical History: Negative for:  Anorexia/bulimia; Confinement Anxiety Immunizations Pneumococcal Vaccine: Received Pneumococcal Vaccination: No Implantable Devices [place], [place] ([id number]) Family and Social History Cancer: Yes - Mother,Father,Siblings; Diabetes: Yes - Siblings; Heart Disease: Yes - Siblings; Hereditary Spherocytosis: No; Hypertension: Yes - Siblings; Kidney Disease: No; Lung Disease: No; Seizures: No; Stroke: No; Thyroid Problems: No; Tuberculosis: No; Former smoker - 7 years; Marital Status - Single; Alcohol Use: Rarely; Drug Use: No History; Caffeine Use: Never; Financial Concerns: No; Food, Clothing or Shelter Needs: No; Support System Lacking: No; Transportation Concerns: No; Advanced Directives: No; Patient does not want information on Advanced Directives Physician Affirmation I have reviewed and agree with the above information. Electronic Signature(s) Signed: 10/12/2018 1:28:21 PM By: Lenda Kelp PA-C Signed: 10/12/2018 1:47:23 PM By: Curtis Sites Entered By: Lenda Kelp on 10/12/2018 12:57:02 Johnathan Scott ([id number]) -------------------------------------------------------------------------------- SuperBill Details Patient  Name: Johnathan Scott Date of Service: 10/12/2018 Medical Record Number: [id number] Patient Account Number: [id number] Date of Birth/Sex: 02-Oct-1966 (52 y.o. M) Treating RN: Curtis Sites Primary Care Provider: Birdie Sons, ERIC Other Clinician: Referring Provider: Birdie Sons, ERIC Treating Provider/Extender: Linwood Dibbles, HOYT Weeks in Treatment: 9 Diagnosis Coding ICD-10 Codes Code Description I87.2 Venous insufficiency (chronic) (peripheral) L97.822 Non-pressure chronic ulcer of other part of left lower leg with fat layer exposed D50.9 Iron deficiency anemia, unspecified J45.998 Other asthma Facility Procedures CPT4 Code Description: [id number] 11042 - DEB SUBQ TISSUE 20 SQ CM/< ICD-10 Diagnosis Description L97.822 Non-pressure chronic ulcer of other part of left lower leg with Modifier: fat layer expos Quantity: 1 ed Physician Procedures CPT4 Code Description: [id number] 11042 - WC PHYS SUBQ TISS 20 SQ CM ICD-10 Diagnosis Description L97.822 Non-pressure chronic ulcer of other part of left lower leg with Modifier: fat layer expos Quantity: 1 ed Electronic Signature(s) Signed: 10/12/2018 1:28:21 PM By: Lenda Kelp PA-C Entered By: Lenda Kelp on 10/12/2018 12:57:31

## 2018-10-13 NOTE — Progress Notes (Signed)
Johnathan Scott, Tamel ([id number]) Visit Report for 10/08/2018 Chief Complaint Document Details Patient Name: Johnathan Scott, Johnathan Scott Date of Service: 10/08/2018 3:30 PM Medical Record Number: [id number] Patient Account Number: [id number] Date of Birth/Sex: 18-Sep-1966 (52 y.o. M) Treating RN: Curtis Sitesorthy, Joanna Primary Care Provider: Birdie SonsSONNENBERG, ERIC Other Clinician: Referring Provider: Birdie SonsSONNENBERG, ERIC Treating Provider/Extender: Linwood DibblesSTONE III, Saniyah Mondesir Weeks in Treatment: 8 Information Obtained from: Patient Chief Complaint Left Anterior LE Ulcer Electronic Signature(s) Signed: 10/09/2018 12:35:32 AM By: Lenda KelpStone III, Hiliary Osorto PA-C Entered By: Lenda KelpStone III, Caasi Giglia on 10/08/2018 15:18:59 Johnathan Scott, [name] ([id number]) -------------------------------------------------------------------------------- HPI Details Patient Name: Johnathan Scott, Johnathan Scott Date of Service: 10/08/2018 3:30 PM Medical Record Number: [id number] Patient Account Number: [id number] Date of Birth/Sex: 18-Sep-1966 (52 y.o. M) Treating RN: Curtis Sitesorthy, Joanna Primary Care Provider: Birdie SonsSONNENBERG, ERIC Other Clinician: Referring Provider: Birdie SonsSONNENBERG, ERIC Treating Provider/Extender: Linwood DibblesSTONE III, Maral Lampe Weeks in Treatment: 8 History of Present Illness HPI Description: 08/09/18 on evaluation today patient presents for initial evaluation and clinic as referral concerning an ulcer that he has on the left anterior lower Trinity which has been present for five months. He states that he actually noticed this after having an "insect bite" while driving down the road. He states that he did not see what kind of insect it was but he felt it wrong up his leg and then have the bite. Since that time is been having issues with the fact that this area does not want to heal. He does have a history of asthma as well as anemia is most recent CBC revealed a hemoglobin of 11.1 with a hematocrit of 34.6. He does have a history of panic attacks as well intermittently. No fevers, chills, nausea, or  vomiting noted at this time. Specifically the patient does not appear to have any infection in regard to the left lower extremity. He's not on the current antibiotics. 08/16/18 upon evaluation today patient actually appears to be doing significantly better in regard to his wound. He has been tolerating the dressing changes that he was performing on his own over the last week. Subsequently we did not wrap his leg at that time although we discussed it. We weren't sure that he was gonna be able to get his pants on top of the dressing. Nonetheless the patient fortunately came today with shorts on he states that he is willing to be wrapped if it will help the wound to heal faster. He is pleased with the fact that the wound already appears to be doing better it's also not hurting as badly. The last debridement was a little bit more uncomfortable for him unfortunately. Nonetheless he states he's willing to allow me to debride it again today if I do so carefully. 08/24/18 upon evaluation today patient actually appears to be doing rather well in regard to his lower Trinity ulcer on the left. He has been tolerating the dressing changes with Iodoflex including the wrap without complication and the wound us into making signs of good progress. Overall I'm very pleased with how things appear at this time. No fevers, chills, nausea, or vomiting noted at this time. 09/03/18 on evaluation today patient actually appears to be doing rather well in regard to his wound. In fact the overall measurement belies the fact that the wound is actually healed quite a bit more than what the measurement would suggest. In fact it's almost half the size that it was during the last evaluation unfortunately a lot of the area where new skin has grown over is in between two areas that had to  be clustered together one of the distal portion of the wound has not completely healed up. Nonetheless there is actually quite a bit more healing  than what seems to be represented by the wound measurement today. Overall I'm very pleased with the overall progress and how he seems to be doing with the dressing changes. The wrap seems to be of great benefit for him as well. 09/09/18 upon evaluation today patient actually appears to be doing excellent in regard to his left lotion the ulcer. He has been tolerating the dressing changes without complication. There does not appear to be any evidence of infection overall I think the [facility] Dressing has done excellent for him. I'm very pleased. 09/23/18 evaluation today patient actually appears to be doing poorly in regard to his lower extremity ulcer. There's a lot of maceration and skin breakdown around the actual wound itself which is making it difficult to even tell exactly what is going on. This is something he states started about four days ago. Nonetheless I'm concerned about the possibility of infection just being that he was not having near this amount of drainage previous. He also states in the past 24 hours the pain has been a little bit more significant. No fevers, chills, nausea, or vomiting noted at this time. 09/30/18 on evaluation today patient actually appears to be doing poorly in regard to his lower extremity ulcer. He's been tolerating the dressing changes although there is a lot of tape irritation surrounding the wound area. Subsequently I do think that he would benefit from [place] initiating treatment with all the compression wrap as we did before to avoid any additional injury to the area. No fevers, chills, nausea, or vomiting noted at this time. The patient did have a return of his wound culture which showed Pseudomonas which will not be covered by the Bactrim that I previously prescribed for him. With that being said the patient states that he did not know I was sitting in a prescription for him and therefore never picked this up he also states the pharmacy did not call  him. Nonetheless this was something that was sent we will call the pharmacy and having counseling as QUANTAVIUS, HUMM ([id number]) I'm gonna have to send them something different. 10/08/18 on evaluation today patient's ulcer still appears to be much larger than what it was previous to the deterioration. With that being said the erythema surrounding the wound bed is not nearly as significant I do believe the antibiotic has been of help for him. He did finally get the Levaquin which is good news. No fevers, chills, nausea, or vomiting noted at this time. Electronic Signature(s) Signed: 10/09/2018 12:35:32 AM By: Lenda Kelp PA-C Entered By: Lenda Kelp on 10/09/2018 00:24:01 Johnathan Scott ([id number]) -------------------------------------------------------------------------------- Physical Exam Details Patient Name: Johnathan Scott Date of Service: 10/08/2018 3:30 PM Medical Record Number: [id number] Patient Account Number: [id number] Date of Birth/Sex: December 22, 1965 (52 y.o. M) Treating RN: Curtis Sites Primary Care Provider: Birdie Sons, ERIC Other Clinician: Referring Provider: Birdie Sons, ERIC Treating Provider/Extender: STONE III, Yacine Garriga Weeks in Treatment: 8 Constitutional Well-nourished and well-hydrated in no acute distress. Respiratory normal breathing without difficulty. clear to auscultation bilaterally. Cardiovascular regular rate and rhythm with normal S1, S2. Psychiatric this patient is able to make decisions and demonstrates good insight into disease process. Alert and Oriented x 3. pleasant and cooperative. Notes Patient's wound bed currently did not require sharp debridement with the infection I did not really want to perform this as it  stands anyway. There may be a point in the future where we do need to consider sharp debridement but again I did not do that today. Electronic Signature(s) Signed: 10/09/2018 12:35:32 AM By: Lenda Kelp PA-C Entered By: Lenda Kelp on 10/09/2018 00:24:40 Johnathan Scott ([id number]) -------------------------------------------------------------------------------- Physician Orders Details Patient Name: Johnathan Scott Date of Service: 10/08/2018 3:30 PM Medical Record Number: [id number] Patient Account Number: [id number] Date of Birth/Sex: Dec 02, 1965 (52 y.o. M) Treating RN: Arnette Norris Primary Care Provider: Birdie Sons, ERIC Other Clinician: Referring Provider: Birdie Sons, ERIC Treating Provider/Extender: Linwood Dibbles, Aizlynn Digilio Weeks in Treatment: 8 Verbal / Phone Orders: No Diagnosis Coding ICD-10 Coding Code Description I87.2 Venous insufficiency (chronic) (peripheral) L97.822 Non-pressure chronic ulcer of other part of left lower leg with fat layer exposed D50.9 Iron deficiency anemia, unspecified J45.998 Other asthma Wound Cleansing Wound #1 Left,Anterior Lower Leg o Clean wound with Normal Saline. o May Shower, gently pat wound dry prior to applying new dressing. Wound #2 Left,Lateral Lower Leg o Clean wound with Normal Saline. o May Shower, gently pat wound dry prior to applying new dressing. Anesthetic (add to Medication List) Wound #1 Left,Anterior Lower Leg o Topical Lidocaine 4% cream applied to wound bed prior to debridement (In Clinic Only). Wound #2 Left,Lateral Lower Leg o Topical Lidocaine 4% cream applied to wound bed prior to debridement (In Clinic Only). Primary Wound Dressing Wound #1 Left,Anterior Lower Leg o Silver Alginate Wound #2 Left,Lateral Lower Leg o Silver Alginate Secondary Dressing Wound #1 Left,Anterior Lower Leg o XtraSorb Wound #2 Left,Lateral Lower Leg o XtraSorb Dressing Change Frequency Wound #1 Left,Anterior Lower Leg o Change dressing every week Wound #2 Left,Lateral Lower Leg DEMICHAEL, TRAUM ([id number]) o Change dressing every week Follow-up Appointments Wound #1 Left,Anterior Lower Leg o Other: - Return Tuesday December  24 Edema Control Wound #1 Left,Anterior Lower Leg o 3 Layer Compression System - Left Lower Extremity o Elevate legs to the level of the heart and pump ankles as often as possible Wound #2 Left,Lateral Lower Leg o 3 Layer Compression System - Left Lower Extremity o Elevate legs to the level of the heart and pump ankles as often as possible Off-Loading Wound #1 Left,Anterior Lower Leg o Turn and reposition every 2 hours Wound #2 Left,Lateral Lower Leg o Turn and reposition every 2 hours Electronic Signature(s) Signed: 10/09/2018 12:35:32 AM By: Lenda Kelp PA-C Signed: 10/12/2018 1:37:43 PM By: Arnette Norris Entered By: Arnette Norris on 10/08/2018 16:27:20 Johnathan Scott ([id number]) -------------------------------------------------------------------------------- Problem List Details Patient Name: Johnathan Scott Date of Service: 10/08/2018 3:30 PM Medical Record Number: [id number] Patient Account Number: [id number] Date of Birth/Sex: 26-Aug-1966 (52 y.o. M) Treating RN: Curtis Sites Primary Care Provider: Birdie Sons, ERIC Other Clinician: Referring Provider: Birdie Sons, ERIC Treating Provider/Extender: Linwood Dibbles, Zaynah Chawla Weeks in Treatment: 8 Active Problems ICD-10 Evaluated Encounter Code Description Active Date Today Diagnosis I87.2 Venous insufficiency (chronic) (peripheral) 08/09/2018 No Yes L97.822 Non-pressure chronic ulcer of other part of left lower leg with 08/09/2018 No Yes fat layer exposed D50.9 Iron deficiency anemia, unspecified 08/09/2018 No Yes J45.998 Other asthma 08/09/2018 No Yes Inactive Problems Resolved Problems Electronic Signature(s) Signed: 10/09/2018 12:35:32 AM By: Lenda Kelp PA-C Entered By: Lenda Kelp on 10/08/2018 15:18:54 Johnathan Scott ([id number]) -------------------------------------------------------------------------------- Progress Note Details Patient Name: Johnathan Scott Date of Service: 10/08/2018  3:30 PM Medical Record Number: [id number] Patient Account Number: [id number] Date of Birth/Sex: 11/24/1965 (52 y.o. M) Treating RN: Curtis Sites Primary Care Provider: Birdie Sons, ERIC Other Clinician: Referring Provider: Birdie Sons, ERIC  Treating Provider/Extender: STONE III, Pawan Knechtel Weeks in Treatment: 8 Subjective Chief Complaint Information obtained from Patient Left Anterior LE Ulcer History of Present Illness (HPI) 08/09/18 on evaluation today patient presents for initial evaluation and clinic as referral concerning an ulcer that he has on the left anterior lower Trinity which has been present for five months. He states that he actually noticed this after having an "insect bite" while driving down the road. He states that he did not see what kind of insect it was but he felt it wrong up his leg and then have the bite. Since that time is been having issues with the fact that this area does not want to heal. He does have a history of asthma as well as anemia is most recent CBC revealed a hemoglobin of 11.1 with a hematocrit of 34.6. He does have a history of panic attacks as well intermittently. No fevers, chills, nausea, or vomiting noted at this time. Specifically the patient does not appear to have any infection in regard to the left lower extremity. He's not on the current antibiotics. 08/16/18 upon evaluation today patient actually appears to be doing significantly better in regard to his wound. He has been tolerating the dressing changes that he was performing on his own over the last week. Subsequently we did not wrap his leg at that time although we discussed it. We weren't sure that he was gonna be able to get his pants on top of the dressing. Nonetheless the patient fortunately came today with shorts on he states that he is willing to be wrapped if it will help the wound to heal faster. He is pleased with the fact that the wound already appears to be doing better it's also not  hurting as badly. The last debridement was a little bit more uncomfortable for him unfortunately. Nonetheless he states he's willing to allow me to debride it again today if I do so carefully. 08/24/18 upon evaluation today patient actually appears to be doing rather well in regard to his lower Trinity ulcer on the left. He has been tolerating the dressing changes with Iodoflex including the wrap without complication and the wound [place] into making signs of good progress. Overall I'm very pleased with how things appear at this time. No fevers, chills, nausea, or vomiting noted at this time. 09/03/18 on evaluation today patient actually appears to be doing rather well in regard to his wound. In fact the overall measurement belies the fact that the wound is actually healed quite a bit more than what the measurement would suggest. In fact it's almost half the size that it was during the last evaluation unfortunately a lot of the area where new skin has grown over is in between two areas that had to be clustered together one of the distal portion of the wound has not completely healed up. Nonetheless there is actually quite a bit more healing than what seems to be represented by the wound measurement today. Overall I'm very pleased with the overall progress and how he seems to be doing with the dressing changes. The wrap seems to be of great benefit for him as well. 09/09/18 upon evaluation today patient actually appears to be doing excellent in regard to his left lotion the ulcer. He has been tolerating the dressing changes without complication. There does not appear to be any evidence of infection overall I think the [facility] Dressing has done excellent for him. I'm very pleased. 09/23/18 evaluation today patient actually  appears to be doing poorly in regard to his lower extremity ulcer. There's a lot of maceration and skin breakdown around the actual wound itself which is making it difficult to  even tell exactly what is going on. This is something he states started about four days ago. Nonetheless I'm concerned about the possibility of infection just being that he was not having near this amount of drainage previous. He also states in the past 24 hours the pain has been a little bit more significant. No fevers, chills, nausea, or vomiting noted at this time. 09/30/18 on evaluation today patient actually appears to be doing poorly in regard to his lower extremity ulcer. He's been tolerating the dressing changes although there is a lot of tape irritation surrounding the wound area. Subsequently I do think [facility] [place] ([id number]) that he would benefit from [place] initiating treatment with all the compression wrap as we did before to avoid any additional injury to the area. No fevers, chills, nausea, or vomiting noted at this time. The patient did have a return of his wound culture which showed Pseudomonas which will not be covered by the Bactrim that I previously prescribed for him. With that being said the patient states that he did not know I was sitting in a prescription for him and therefore never picked this up he also states the pharmacy did not call him. Nonetheless this was something that was sent we will call the pharmacy and having counseling as I'm gonna have to send them something different. 10/08/18 on evaluation today patient's ulcer still appears to be much larger than what it was previous to the deterioration. With that being said the erythema surrounding the wound bed is not nearly as significant I do believe the antibiotic has been of help for him. He did finally get the Levaquin which is good news. No fevers, chills, nausea, or vomiting noted at this time. Patient History Information obtained from Patient. Family History Cancer - Mother,Father,Siblings, Diabetes - Siblings, Heart Disease - Siblings, Hypertension - Siblings, No family history of Hereditary Spherocytosis,  Kidney Disease, Lung Disease, Seizures, Stroke, Thyroid Problems, Tuberculosis. Social History Former smoker - 7 years, Marital Status - Single, Alcohol Use - Rarely, Drug Use - No History, Caffeine Use - Never. Review of Systems (ROS) Constitutional Symptoms (General Health) Denies complaints or symptoms of Fatigue, Fever, Chills. Respiratory The patient has no complaints or symptoms. Cardiovascular Complains or has symptoms of LE edema. Psychiatric The patient has no complaints or symptoms. Objective Constitutional Well-nourished and well-hydrated in no acute distress. Vitals Time Taken: 3:46 PM, Height: 61 in, Weight: 232 lbs, BMI: 43.8, Temperature: 97.6 F, Pulse: 77 bpm, Respiratory Rate: 16 breaths/min, Blood Pressure: 130/70 mmHg. Respiratory normal breathing without difficulty. clear to auscultation bilaterally. Cardiovascular regular rate and rhythm with normal S1, S2. Psychiatric this patient is able to make decisions and demonstrates good insight into disease process. Alert and Oriented x 3. pleasant and cooperative. REMUS, HAGEDORN ([id number]) General Notes: Patient's wound bed currently did not require sharp debridement with the infection I did not really want to perform this as it stands anyway. There may be a point in the future where we do need to consider sharp debridement but again I did not do that today. Integumentary (Hair, Skin) Wound #1 status is Open. Original cause of wound was Gradually Appeared. The wound is located on the Left,Anterior Lower Leg. The wound measures 8.5cm length x 3.4cm width x 0.1cm depth; 22.698cm^2 area and 2.27cm^3 volume. There is  Fat Layer (Subcutaneous Tissue) Exposed exposed. There is no tunneling or undermining noted. There is a large amount of serous drainage noted. The wound margin is flat and intact. There is medium (34-66%) red, hyper - granulation within the wound bed. There is a medium (34-66%) amount of necrotic tissue  within the wound bed including Adherent Slough. The periwound skin appearance exhibited: Excoriation, Maceration, Hemosiderin Staining, Erythema. The periwound skin appearance did not exhibit: Callus, Crepitus, Induration, Rash, Scarring, Dry/Scaly, Atrophie Blanche, Cyanosis, Ecchymosis, Mottled, Pallor, Rubor. The surrounding wound skin color is noted with erythema which is circumferential. Erythema is measured. Periwound temperature was noted as No Abnormality. The periwound has tenderness on palpation. Wound #2 status is Open. Original cause of wound was Gradually Appeared. The wound is located on the Left,Lateral Lower Leg. The wound measures 1cm length x 1.3cm width x 0.1cm depth; 1.021cm^2 area and 0.102cm^3 volume. There is Fat Layer (Subcutaneous Tissue) Exposed exposed. There is no tunneling or undermining noted. There is a large amount of serous drainage noted. The wound margin is flat and intact. There is large (67-100%) red granulation within the wound bed. There is no necrotic tissue within the wound bed. The periwound skin appearance did not exhibit: Callus, Crepitus, Excoriation, Induration, Rash, Scarring, Dry/Scaly, Maceration, Atrophie Blanche, Cyanosis, Ecchymosis, Hemosiderin Staining, Mottled, Pallor, Rubor, Erythema. Periwound temperature was noted as No Abnormality. The periwound has tenderness on palpation. Assessment Active Problems ICD-10 Venous insufficiency (chronic) (peripheral) Non-pressure chronic ulcer of other part of left lower leg with fat layer exposed Iron deficiency anemia, unspecified Other asthma Procedures Wound #2 Pre-procedure diagnosis of Wound #2 is a Venous Leg Ulcer located on the Left,Lateral Lower Leg . There was a Three Layer Compression Therapy Procedure with a pre-treatment ABI of 1.2 by Arnette Norris, RN. Post procedure Diagnosis Wound #2: Same as Pre-Procedure Plan TYAIRE, ODEM ([id number]) Wound Cleansing: Wound #1 Left,Anterior  Lower Leg: Clean wound with Normal Saline. May Shower, gently pat wound dry prior to applying new dressing. Wound #2 Left,Lateral Lower Leg: Clean wound with Normal Saline. May Shower, gently pat wound dry prior to applying new dressing. Anesthetic (add to Medication List): Wound #1 Left,Anterior Lower Leg: Topical Lidocaine 4% cream applied to wound bed prior to debridement (In Clinic Only). Wound #2 Left,Lateral Lower Leg: Topical Lidocaine 4% cream applied to wound bed prior to debridement (In Clinic Only). Primary Wound Dressing: Wound #1 Left,Anterior Lower Leg: Silver Alginate Wound #2 Left,Lateral Lower Leg: Silver Alginate Secondary Dressing: Wound #1 Left,Anterior Lower Leg: XtraSorb Wound #2 Left,Lateral Lower Leg: XtraSorb Dressing Change Frequency: Wound #1 Left,Anterior Lower Leg: Change dressing every week Wound #2 Left,Lateral Lower Leg: Change dressing every week Follow-up Appointments: Wound #1 Left,Anterior Lower Leg: Other: - Return Tuesday December 24 Edema Control: Wound #1 Left,Anterior Lower Leg: 3 Layer Compression System - Left Lower Extremity Elevate legs to the level of the heart and pump ankles as often as possible Wound #2 Left,Lateral Lower Leg: 3 Layer Compression System - Left Lower Extremity Elevate legs to the level of the heart and pump ankles as often as possible Off-Loading: Wound #1 Left,Anterior Lower Leg: Turn and reposition every 2 hours Wound #2 Left,Lateral Lower Leg: Turn and reposition every 2 hours We will subsequently see were things stand at follow-up. If anything changes worsens the meantime he will contact the office and let me know. Please see above for specific wound care orders. We will see patient for re-evaluation in 1 week(s) here in the clinic. If anything  worsens or changes patient will contact our office for additional recommendations. Electronic Signature(s) Signed: 10/09/2018 12:35:32 AM By: Lenda Kelp  PA-C Entered By: Lenda Kelp on 10/09/2018 00:24:51 CHARAN, PRIETO ([id number]) [place], Maisie Fus ([id number]) -------------------------------------------------------------------------------- ROS/PFSH Details Patient Name: Johnathan Scott Date of Service: 10/08/2018 3:30 PM Medical Record Number: [id number] Patient Account Number: [id number] Date of Birth/Sex: [birth year] (52 y.o. M) Treating RN: Curtis Sites Primary Care Provider: Birdie Sons, ERIC Other Clinician: Referring Provider: Birdie Sons, ERIC Treating Provider/Extender: Linwood Dibbles, Dimitri Shakespeare Weeks in Treatment: 8 Information Obtained From Patient Wound History Do you currently have one or more open woundso Yes How many open wounds do you currently haveo 1 Approximately how long have you had your woundso 5 months How have you been treating your wound(s) until nowo open to air Has your wound(s) ever healed and then re-openedo No Have you had any lab work done in the past montho No Have you tested positive for an antibiotic resistant organism (MRSA, VRE)o No Have you tested positive for osteomyelitis (bone infection)o No Have you had any tests for circulation on your legso No Have you had other problems associated with your woundso Swelling Constitutional Symptoms (General Health) Complaints and Symptoms: Negative for: Fatigue; Fever; Chills Cardiovascular Complaints and Symptoms: Positive for: LE edema Medical History: Negative for: Angina; Arrhythmia; Congestive Heart Failure; Coronary Artery Disease; Deep Vein Thrombosis; Hypertension; Hypotension; Myocardial Infarction; Peripheral Arterial Disease; Peripheral Venous Disease; Phlebitis; Vasculitis Eyes Medical History: Negative for: Cataracts; Glaucoma; Optic Neuritis Ear/Nose/Mouth/Throat Medical History: Negative for: Chronic sinus problems/congestion; Middle ear problems Hematologic/Lymphatic Medical History: Negative for: Anemia; Hemophilia; Human  Immunodeficiency Virus; Lymphedema; Sickle Cell Disease Respiratory Complaints and Symptoms: No Complaints or Symptoms ROBEN, SCHLIEP ([id number]) Medical History: Positive for: Asthma - history as a child Negative for: Aspiration; Chronic Obstructive Pulmonary Disease (COPD); Pneumothorax; Sleep Apnea; Tuberculosis Gastrointestinal Medical History: Negative for: Cirrhosis ; Colitis; Crohnos; Hepatitis A; Hepatitis B; Hepatitis C Endocrine Medical History: Negative for: Type I Diabetes; Type II Diabetes Genitourinary Medical History: Negative for: End Stage Renal Disease Immunological Medical History: Negative for: Lupus Erythematosus; Raynaudos; Scleroderma Integumentary (Skin) Medical History: Negative for: History of Burn; History of pressure wounds Musculoskeletal Medical History: Positive for: Osteoarthritis - both hips Negative for: Gout; Rheumatoid Arthritis; Osteomyelitis Neurologic Medical History: Positive for: Seizure Disorder - as a child Negative for: Dementia; Neuropathy; Quadriplegia; Paraplegia Oncologic Medical History: Negative for: Received Chemotherapy; Received Radiation Psychiatric Complaints and Symptoms: No Complaints or Symptoms Medical History: Negative for: Anorexia/bulimia; Confinement Anxiety Immunizations Pneumococcal Vaccine: Received Pneumococcal Vaccination: No Implantable Devices CADYN, FANN ([id number]) Family and Social History Cancer: Yes - Mother,Father,Siblings; Diabetes: Yes - Siblings; Heart Disease: Yes - Siblings; Hereditary Spherocytosis: No; Hypertension: Yes - Siblings; Kidney Disease: No; Lung Disease: No; Seizures: No; Stroke: No; Thyroid Problems: No; Tuberculosis: No; Former smoker - 7 years; Marital Status - Single; Alcohol Use: Rarely; Drug Use: No History; Caffeine Use: Never; Financial Concerns: No; Food, Clothing or Shelter Needs: No; Support System Lacking: No; Transportation Concerns: No; Advanced  Directives: No; Patient does not want information on Advanced Directives Physician Affirmation I have reviewed and agree with the above information. Electronic Signature(s) Signed: 10/09/2018 12:35:32 AM By: Lenda Kelp PA-C Signed: 10/11/2018 4:55:08 PM By: Curtis Sites Entered By: Lenda Kelp on 10/09/2018 00:24:26 Johnathan Scott ([id number]) -------------------------------------------------------------------------------- SuperBill Details Patient Name: Johnathan Scott Date of Service: 10/08/2018 Medical Record Number: [id number] Patient Account Number: [id number] Date of Birth/Sex: [birth year] (52 y.o. M) Treating RN: Arnette Norris Primary Care Provider: Birdie Sons, ERIC  Other Clinician: Referring Provider: Birdie SonsSONNENBERG, ERIC Treating Provider/Extender: Linwood DibblesSTONE III, Fatou Dunnigan Weeks in Treatment: 8 Diagnosis Coding ICD-10 Codes Code Description I87.2 Venous insufficiency (chronic) (peripheral) L97.822 Non-pressure chronic ulcer of other part of left lower leg with fat layer exposed D50.9 Iron deficiency anemia, unspecified J45.998 Other asthma Facility Procedures CPT4 Code: [id number] Description: (Facility Use Only) [phone] - APPLY MULTLAY COMPRS LWR LT LEG Modifier: Quantity: 1 Physician Procedures CPT4 Code Description: [id number] - WC PHYS LEVEL 4 - EST PT ICD-10 Diagnosis Description I87.2 Venous insufficiency (chronic) (peripheral) L97.822 Non-pressure chronic ulcer of other part of left lower leg wit D50.9 Iron deficiency anemia,  unspecified J45.998 Other asthma Modifier: h fat layer expos Quantity: 1 ed Electronic Signature(s) Signed: 10/09/2018 12:35:32 AM By: Lenda KelpStone III, Veleka Djordjevic PA-C Entered By: Lenda KelpStone III, Lawan Nanez on 10/09/2018 00:25:26

## 2018-10-13 NOTE — Progress Notes (Signed)
ANEES, VANECEK ([id number]) Visit Report for 10/12/2018 Arrival Information Details Patient Name: Johnathan Scott, Johnathan Scott Date of Service: 10/12/2018 11:00 AM Medical Record Number: [id number] Patient Account Number: [id number] Date of Birth/Sex: 09-Apr-1966 (52 y.o. M) Treating RN: Huel Coventry Primary Care Katriel Cutsforth: Birdie Sons, ERIC Other Clinician: Referring Clarise Chacko: Birdie Sons, ERIC Treating Amy Gothard/Extender: Linwood Dibbles, HOYT Weeks in Treatment: 9 Visit Information History Since Last Visit Added or deleted any medications: Yes Patient Arrived: Cane Any new allergies or adverse reactions: No Arrival Time: 11:20 Had a fall or experienced change in No Accompanied By: self activities of daily living that may affect Transfer Assistance: None risk of falls: Patient Identification Verified: Yes Signs or symptoms of abuse/neglect since last visito No Secondary Verification Process Yes Hospitalized since last visit: No Completed: Implantable device outside of the clinic excluding No Patient Has Alerts: Yes cellular tissue based products placed in the center Patient Alerts: PT REFUSED FACE since last visit: PHOTO Has Dressing in Place as Prescribed: Yes Has Compression in Place as Prescribed: Yes Pain Present Now: No Electronic Signature(s) Signed: 10/12/2018 1:27:23 PM By: Elliot Gurney, BSN, RN, CWS, Kim RN, BSN Entered By: Elliot Gurney, BSN, RN, CWS, Kim on 10/12/2018 11:21:23 Johnathan Scott ([id number]) -------------------------------------------------------------------------------- Encounter Discharge Information Details Patient Name: Johnathan Scott Date of Service: 10/12/2018 11:00 AM Medical Record Number: [id number] Patient Account Number: [id number] Date of Birth/Sex: 1966-10-17 (52 y.o. M) Treating RN: Curtis Sites Primary Care Rishav Rockefeller: Birdie Sons, ERIC Other Clinician: Referring Kyrin Gratz: Birdie Sons, ERIC Treating Dashawn Bartnick/Extender: Linwood Dibbles, HOYT Weeks in Treatment: 9 Encounter  Discharge Information Items Post Procedure Vitals Discharge Condition: Stable Temperature (F): 97.6 Ambulatory Status: Cane Pulse (bpm): 63 Discharge Destination: Home Respiratory Rate (breaths/min): 18 Transportation: Private Auto Blood Pressure (mmHg): 147/70 Accompanied By: self Schedule Follow-up Appointment: Yes Clinical Summary of Care: Electronic Signature(s) Signed: 10/12/2018 1:27:33 PM By: Curtis Sites Entered By: Curtis Sites on 10/12/2018 13:27:33 Johnathan Scott ([id number]) -------------------------------------------------------------------------------- Lower Extremity Assessment Details Patient Name: Johnathan Scott Date of Service: 10/12/2018 11:00 AM Medical Record Number: [id number] Patient Account Number: [id number] Date of Birth/Sex: 1965/11/01 (52 y.o. M) Treating RN: Huel Coventry Primary Care Daneen Volcy: Birdie Sons, ERIC Other Clinician: Referring Tykera Skates: Birdie Sons, ERIC Treating Daymond Cordts/Extender: Linwood Dibbles, HOYT Weeks in Treatment: 9 Edema Assessment Assessed: [Left: No] [Right: No] [Left: Edema] [Right: :] Calf Left: Right: Point of Measurement: 34 cm From Medial Instep 51 cm cm Ankle Left: Right: Point of Measurement: 10 cm From Medial Instep 27 cm cm Vascular Assessment Pulses: Dorsalis Pedis Palpable: [Left:Yes] Posterior Tibial Extremity colors, hair growth, and conditions: Extremity Color: [Left:Hyperpigmented] Hair Growth on Extremity: [Left:No] Temperature of Extremity: [Left:Warm] Capillary Refill: [Left:< 3 seconds] Toe Nail Assessment Left: Right: Thick: Yes Discolored: Yes Deformed: Yes Improper Length and Hygiene: Yes Electronic Signature(s) Signed: 10/12/2018 1:27:23 PM By: Elliot Gurney, BSN, RN, CWS, Kim RN, BSN Entered By: Elliot Gurney, BSN, RN, CWS, Kim on 10/12/2018 11:36:25 Johnathan Scott ([id number]) -------------------------------------------------------------------------------- Multi Wound Chart Details Patient Name:  Johnathan Scott Date of Service: 10/12/2018 11:00 AM Medical Record Number: [id number] Patient Account Number: [id number] Date of Birth/Sex: [birth year] (52 y.o. M) Treating RN: Curtis Sites Primary Care Fynley Chrystal: Birdie Sons, ERIC Other Clinician: Referring Tyrea Froberg: Birdie Sons, ERIC Treating Briunna Leicht/Extender: Linwood Dibbles, HOYT Weeks in Treatment: 9 Vital Signs Height(in): 61 Pulse(bpm): 63 Weight(lbs): 232 Blood Pressure(mmHg): 147/70 Body Mass Index(BMI): 44 Temperature(F): 97.6 Respiratory Rate 16 (breaths/min): Photos: [N/A:N/A] Wound Location: Left Lower Leg - Anterior Left, Lateral Lower Leg N/A Wounding Event: Gradually Appeared Gradually Appeared N/A Primary Etiology: Venous Leg Ulcer Venous Leg Ulcer N/A Comorbid History: Asthma,  Osteoarthritis, Asthma, Osteoarthritis, N/A Seizure Disorder Seizure Disorder Date Acquired: 03/08/2018 09/30/2018 N/A Weeks of Treatment: 9 1 N/A Wound Status: Open Healed - Epithelialized N/A Measurements L x W x D 6x3.2x0.1 0x0x0 N/A (cm) Area (cm) : 15.08 0 N/A Volume (cm) : 1.508 0 N/A % Reduction in Area: -52.40% 100.00% N/A % Reduction in Volume: 23.80% 100.00% N/A Classification: Full Thickness Without Full Thickness Without N/A Exposed Support Structures Exposed Support Structures Exudate Amount: Large Large N/A Exudate Type: Serous Serous N/A Exudate Color: amber amber N/A Wound Margin: Flat and Intact Flat and Intact N/A Granulation Amount: Small (1-33%) None Present (0%) N/A Granulation Quality: Red N/A N/A Necrotic Amount: Large (67-100%) None Present (0%) N/A Exposed Structures: Fat Layer (Subcutaneous Fascia: No N/A Tissue) Exposed: Yes Fat Layer (Subcutaneous Fascia: No Tissue) Exposed: No Tendon: No Tendon: No Muscle: No Muscle: No Vanbergen, Maisie FusHOMAS ([id number]) Joint: No Joint: No Bone: No Bone: No Epithelialization: Small (1-33%) Large (67-100%) N/A Periwound Skin Texture: Excoriation: No Excoriation:  No N/A Induration: No Induration: No Callus: No Callus: No Crepitus: No Crepitus: No Rash: No Rash: No Scarring: No Scarring: No Periwound Skin Moisture: Maceration: Yes Maceration: No N/A Dry/Scaly: No Dry/Scaly: No Periwound Skin Color: Hemosiderin Staining: Yes Atrophie Blanche: No N/A Atrophie Blanche: No Cyanosis: No Cyanosis: No Ecchymosis: No Ecchymosis: No Erythema: No Erythema: No Hemosiderin Staining: No Mottled: No Mottled: No Pallor: No Pallor: No Rubor: No Rubor: No Temperature: No Abnormality No Abnormality N/A Tenderness on Palpation: Yes Yes N/A Wound Preparation: Ulcer Cleansing: Other: soap Ulcer Cleansing: N/A and water Rinsed/Irrigated with Saline Topical Anesthetic Applied: Topical Anesthetic Applied: Other: lidocaine 4% None Treatment Notes Electronic Signature(s) Signed: 10/12/2018 1:47:23 PM By: Curtis Sitesorthy, Joanna Entered By: Curtis Sitesorthy, Joanna on 10/12/2018 12:17:32 Johnathan SextonSHAMBLEY, Leondre ([id number]) -------------------------------------------------------------------------------- Multi-Disciplinary Care Plan Details Patient Name: Johnathan SextonSHAMBLEY, Hairo Date of Service: 10/12/2018 11:00 AM Medical Record Number: [id number] Patient Account Number: [id number] Date of Birth/Sex: 02/09/1966 (52 y.o. M) Treating RN: Curtis Sitesorthy, Joanna Primary Care Monisha Siebel: Birdie SonsSONNENBERG, ERIC Other Clinician: Referring Marcele Kosta: Birdie SonsSONNENBERG, ERIC Treating Onaje Warne/Extender: Linwood DibblesSTONE III, HOYT Weeks in Treatment: 9 Active Inactive Necrotic Tissue Nursing Diagnoses: Knowledge deficit related to management of necrotic/devitalized tissue Goals: Necrotic/devitalized tissue will be minimized in the wound bed Date Initiated: 08/09/2018 Target Resolution Date: 09/09/2018 Goal Status: Active Interventions: Assess patient pain level pre-, during and post procedure and prior to discharge Treatment Activities: Apply topical anesthetic as ordered : 08/09/2018 Excisional debridement :  08/09/2018 Notes: Orientation to the Wound Care Program Nursing Diagnoses: Knowledge deficit related to the wound healing center program Goals: Patient/caregiver will verbalize understanding of the Wound Healing Center Program Date Initiated: 08/09/2018 Target Resolution Date: 09/09/2018 Goal Status: Active Interventions: Provide education on orientation to the wound center Notes: Wound/Skin Impairment Nursing Diagnoses: Impaired tissue integrity Knowledge deficit related to smoking impact on wound healing Goals: Ulcer/skin breakdown will have a volume reduction of 30% by week 4 Johnathan SextonSHAMBLEY, Brix ([id number]) Date Initiated: 08/09/2018 Target Resolution Date: 09/09/2018 Goal Status: Active Interventions: Assess patient/caregiver ability to obtain necessary supplies Assess ulceration(s) every visit Treatment Activities: Topical wound management initiated : 08/09/2018 Notes: Electronic Signature(s) Signed: 10/12/2018 1:47:23 PM By: Curtis Sitesorthy, Joanna Entered By: Curtis Sitesorthy, Joanna on 10/12/2018 12:17:22 Johnathan SextonSHAMBLEY, Kenton ([id number]) -------------------------------------------------------------------------------- Pain Assessment Details Patient Name: Johnathan SextonSHAMBLEY, Anthone Date of Service: 10/12/2018 11:00 AM Medical Record Number: [id number] Patient Account Number: [id number] Date of Birth/Sex: 02/09/1966 (52 y.o. M) Treating RN: Huel CoventryWoody, Kim Primary Care Clerence Gubser: Birdie SonsSONNENBERG, ERIC Other Clinician: Referring Inri Sobieski: Birdie SonsSONNENBERG, ERIC Treating Catalia Massett/Extender: Linwood DibblesSTONE III, HOYT Weeks  in Treatment: 9 Active Problems Location of Pain Severity and Description of Pain Patient Has Paino Yes Site Locations Pain Location: Generalized Pain Pain Management and Medication Current Pain Management: Goals for Pain Management Hip pain Electronic Signature(s) Signed: 10/12/2018 1:27:23 PM By: Elliot Gurney, BSN, RN, CWS, Kim RN, BSN Entered By: Elliot Gurney, BSN, RN, CWS, Kim on 10/12/2018 11:21:48 Johnathan Scott ([id number]) -------------------------------------------------------------------------------- Patient/Caregiver Education Details Patient Name: Johnathan Scott Date of Service: 10/12/2018 11:00 AM Medical Record Number: [id number] Patient Account Number: [id number] Date of Birth/Gender: Jun 23, 1966 (52 y.o. M) Treating RN: Curtis Sites Primary Care Physician: Birdie Sons, ERIC Other Clinician: Referring Physician: Birdie Sons, ERIC Treating Physician/Extender: Skeet Simmer in Treatment: 9 Education Assessment Education Provided To: Patient Education Topics Provided Venous: Handouts: Other: leg elevation Methods: Explain/Verbal Responses: State content correctly Electronic Signature(s) Signed: 10/12/2018 1:47:23 PM By: Curtis Sites Entered By: Curtis Sites on 10/12/2018 12:24:31 Johnathan Scott ([id number]) -------------------------------------------------------------------------------- Wound Assessment Details Patient Name: Johnathan Scott Date of Service: 10/12/2018 11:00 AM Medical Record Number: [id number] Patient Account Number: [id number] Date of Birth/Sex: [birth year] (52 y.o. M) Treating RN: Huel Coventry Primary Care Jerrald Doverspike: Birdie Sons, ERIC Other Clinician: Referring Tine Mabee: Birdie Sons, ERIC Treating Daily Doe/Extender: Linwood Dibbles, HOYT Weeks in Treatment: 9 Wound Status Wound Number: 1 Primary Etiology: Venous Leg Ulcer Wound Location: Left Lower Leg - Anterior Wound Status: Open Wounding Event: Gradually Appeared Comorbid History: Asthma, Osteoarthritis, Seizure Disorder Date Acquired: 03/08/2018 Weeks Of Treatment: 9 Clustered Wound: No Photos Photo Uploaded By: Elliot Gurney, BSN, RN, CWS, Kim on 10/12/2018 11:50:00 Wound Measurements Length: (cm) 6 Width: (cm) 3.2 Depth: (cm) 0.1 Area: (cm) 15.08 Volume: (cm) 1.508 % Reduction in Area: -52.4% % Reduction in Volume: 23.8% Epithelialization: Small (1-33%) Tunneling: No Undermining:  No Wound Description Full Thickness Without Exposed Support Foul Od Classification: Structures Slough/ Wound Margin: Flat and Intact Exudate Large Amount: Exudate Type: Serous Exudate Color: amber or After Cleansing: No Fibrino Yes Wound Bed Granulation Amount: Small (1-33%) Exposed Structure Granulation Quality: Red Fascia Exposed: No Necrotic Amount: Large (67-100%) Fat Layer (Subcutaneous Tissue) Exposed: Yes Necrotic Quality: Adherent Slough Tendon Exposed: No Muscle Exposed: No Joint Exposed: No Bone Exposed: No Mccarney, Champion ([id number]) Periwound Skin Texture Texture Color No Abnormalities Noted: No No Abnormalities Noted: No Callus: No Atrophie Blanche: No Crepitus: No Cyanosis: No Excoriation: No Ecchymosis: No Induration: No Erythema: No Rash: No Hemosiderin Staining: Yes Scarring: No Mottled: No Pallor: No Moisture Rubor: No No Abnormalities Noted: No Dry / Scaly: No Temperature / Pain Maceration: Yes Temperature: No Abnormality Tenderness on Palpation: Yes Wound Preparation Ulcer Cleansing: Other: soap and water, Topical Anesthetic Applied: Other: lidocaine 4%, Treatment Notes Wound #1 (Left, Anterior Lower Leg) Notes silvercel, xtrasorb, 3 layer wrap with unna to Ecologist) Signed: 10/12/2018 1:27:23 PM By: Elliot Gurney, BSN, RN, CWS, Kim RN, BSN Entered By: Elliot Gurney, BSN, RN, CWS, Kim on 10/12/2018 11:34:15 Johnathan Scott ([id number]) -------------------------------------------------------------------------------- Wound Assessment Details Patient Name: Johnathan Scott Date of Service: 10/12/2018 11:00 AM Medical Record Number: [id number] Patient Account Number: [id number] Date of Birth/Sex: 1966/08/08 (52 y.o. M) Treating RN: Curtis Sites Primary Care Brettany Sydney: Birdie Sons, ERIC Other Clinician: Referring Reana Chacko: Birdie Sons, ERIC Treating Wayland Baik/Extender: STONE III, HOYT Weeks in Treatment: 9 Wound Status Wound  Number: 2 Primary Etiology: Venous Leg Ulcer Wound Location: Left, Lateral Lower Leg Wound Status: Healed - Epithelialized Wounding Event: Gradually Appeared Comorbid History: Asthma, Osteoarthritis, Seizure Disorder Date Acquired: 09/30/2018 Weeks Of Treatment: 1 Clustered Wound: No Photos Photo Uploaded By: Elliot Gurney, BSN, RN, CWS, Kim on 10/12/2018  11:50:22 Wound Measurements Length: (cm) 0 % Reduct Width: (cm) 0 % Reduct Depth: (cm) 0 Epitheli Area: (cm) 0 Tunneli Volume: (cm) 0 Undermi ion in Area: 100% ion in Volume: 100% alization: Large (67-100%) ng: No ning: No Wound Description Full Thickness Without Exposed Support Foul Od Classification: Structures Slough/ Wound Margin: Flat and Intact Exudate Large Amount: Exudate Type: Serous Exudate Color: amber or After Cleansing: No Fibrino No Wound Bed Granulation Amount: None Present (0%) Exposed Structure Necrotic Amount: None Present (0%) Fascia Exposed: No Fat Layer (Subcutaneous Tissue) Exposed: No Tendon Exposed: No Muscle Exposed: No Joint Exposed: No Bone Exposed: No Mele, Vaibhav ([id number]) Periwound Skin Texture Texture Color No Abnormalities Noted: No No Abnormalities Noted: No Callus: No Atrophie Blanche: No Crepitus: No Cyanosis: No Excoriation: No Ecchymosis: No Induration: No Erythema: No Rash: No Hemosiderin Staining: No Scarring: No Mottled: No Pallor: No Moisture Rubor: No No Abnormalities Noted: No Dry / Scaly: No Temperature / Pain Maceration: No Temperature: No Abnormality Tenderness on Palpation: Yes Wound Preparation Ulcer Cleansing: Rinsed/Irrigated with Saline Topical Anesthetic Applied: None Electronic Signature(s) Signed: 10/12/2018 1:47:23 PM By: Curtis Sitesorthy, Joanna Entered By: Curtis Sitesorthy, Joanna on 10/12/2018 12:16:58 Johnathan SextonSHAMBLEY, Shemar ([id number]) -------------------------------------------------------------------------------- Vitals Details Patient Name: Johnathan SextonSHAMBLEY,  Abednego Date of Service: 10/12/2018 11:00 AM Medical Record Number: [id number] Patient Account Number: [id number] Date of Birth/Sex: June 30, 1966 (52 y.o. M) Treating RN: Huel CoventryWoody, Kim Primary Care Kurstin Dimarzo: Birdie SonsSONNENBERG, ERIC Other Clinician: Referring Shera Laubach: Birdie SonsSONNENBERG, ERIC Treating Sarissa Dern/Extender: Linwood DibblesSTONE III, HOYT Weeks in Treatment: 9 Vital Signs Time Taken: 11:21 Temperature (F): 97.6 Height (in): 61 Pulse (bpm): 63 Weight (lbs): 232 Respiratory Rate (breaths/min): 16 Body Mass Index (BMI): 43.8 Blood Pressure (mmHg): 147/70 Reference Range: 80 - 120 mg / dl Electronic Signature(s) Signed: 10/12/2018 1:27:23 PM By: Elliot GurneyWoody, BSN, RN, CWS, Kim RN, BSN Entered By: Elliot GurneyWoody, BSN, RN, CWS, Kim on 10/12/2018 11:23:18

## 2018-10-19 ENCOUNTER — Encounter: Payer: 59 | Admitting: Family Medicine

## 2018-10-19 DIAGNOSIS — I872 Venous insufficiency (chronic) (peripheral): Secondary | ICD-10-CM | POA: Diagnosis not present

## 2018-10-19 DIAGNOSIS — L97822 Non-pressure chronic ulcer of other part of left lower leg with fat layer exposed: Secondary | ICD-10-CM | POA: Diagnosis not present

## 2018-10-21 ENCOUNTER — Ambulatory Visit: Payer: 59

## 2018-10-21 NOTE — Progress Notes (Signed)
Johnathan SextonSHAMBLEY, Dolphus ([id number]) Visit Report for 10/19/2018 Chief Complaint Document Details Patient Name: Johnathan SextonSHAMBLEY, Johnathan Scott Date of Service: 10/19/2018 8:45 AM Medical Record Number: [id number] Patient Account Number: [id number] Date of Birth/Sex: 11-Sep-1966 (53 y.o. M) Treating RN: Huel CoventryWoody, Kim Primary Care Provider: Birdie SonsSONNENBERG, ERIC Other Clinician: Referring Provider: Birdie SonsSONNENBERG, ERIC Treating Provider/Extender: Youlanda RoysKnight, Opie Fanton Weeks in Treatment: 10 Information Obtained from: Patient Chief Complaint Left Anterior LE Ulcer Electronic Signature(s) Signed: 10/21/2018 12:47:52 AM By: Wilkie AyeKnight, Shuaib Corsino FNP-C Entered By: Wilkie AyeKnight, Forever Arechiga on 10/19/2018 10:15:44 Johnathan SextonSHAMBLEY, Pravin ([id number]) -------------------------------------------------------------------------------- Debridement Details Patient Name: Johnathan SextonSHAMBLEY, Johnathan Scott Date of Service: 10/19/2018 8:45 AM Medical Record Number: [id number] Patient Account Number: [id number] Date of Birth/Sex: 11-Sep-1966 (53 y.o. M) Treating RN: Huel CoventryWoody, Kim Primary Care Provider: Birdie SonsSONNENBERG, ERIC Other Clinician: Referring Provider: Birdie SonsSONNENBERG, ERIC Treating Provider/Extender: Youlanda RoysKnight, Natasha Burda Weeks in Treatment: 10 Debridement Performed for Wound #1 Left,Anterior Lower Leg Assessment: Performed By: Physician Wilkie AyeKnight, Jennel Mara, FNP Debridement Type: Chemical/Enzymatic/Mechanical Agent Used: saline and gauze Severity of Tissue Pre Fat layer exposed Debridement: Level of Consciousness (Pre- Awake and Alert procedure): Pre-procedure Verification/Time Yes - 09:15 Out Taken: Start Time: 09:15 Pain Control: Lidocaine Instrument: Other : saline and gauze Bleeding: None End Time: 09:19 Response to Treatment: Procedure was tolerated well Level of Consciousness Awake and Alert (Post-procedure): Post Debridement Measurements of Total Wound Length: (cm) 2 Width: (cm) 1.8 Depth: (cm) 0.1 Volume: (cm) 0.283 Character of Wound/Ulcer Post Debridement: Requires  Further Debridement Severity of Tissue Post Debridement: Fat layer exposed Post Procedure Diagnosis Same as Pre-procedure Electronic Signature(s) Signed: 10/19/2018 4:45:10 PM By: Elliot GurneyWoody, BSN, RN, CWS, Kim RN, BSN Signed: 10/21/2018 12:47:52 AM By: Wilkie AyeKnight, Jahmeer Porche FNP-C Entered By: Elliot GurneyWoody, BSN, RN, CWS, Kim on 10/19/2018 09:19:38 Johnathan SextonSHAMBLEY, Arad ([id number]) -------------------------------------------------------------------------------- HPI Details Patient Name: Johnathan SextonSHAMBLEY, Johnathan Scott Date of Service: 10/19/2018 8:45 AM Medical Record Number: [id number] Patient Account Number: [id number] Date of Birth/Sex: 11-Sep-1966 (53 y.o. M) Treating RN: Huel CoventryWoody, Kim Primary Care Provider: Birdie SonsSONNENBERG, ERIC Other Clinician: Referring Provider: Birdie SonsSONNENBERG, ERIC Treating Provider/Extender: Youlanda RoysKnight, Tamyrah Burbage Weeks in Treatment: 10 History of Present Illness HPI Description: 08/09/18 on evaluation today patient presents for initial evaluation and clinic as referral concerning an ulcer that he has on the left anterior lower Trinity which has been present for five months. He states that he actually noticed this after having an "insect bite" while driving down the road. He states that he did not see what kind of insect it was but he felt it wrong up his leg and then have the bite. Since that time is been having issues with the fact that this area does not want to heal. He does have a history of asthma as well as anemia is most recent CBC revealed a hemoglobin of 11.1 with a hematocrit of 34.6. He does have a history of panic attacks as well intermittently. No fevers, chills, nausea, or vomiting noted at this time. Specifically the patient does not appear to have any infection in regard to the left lower extremity. He's not on the current antibiotics. 08/16/18 upon evaluation today patient actually appears to be doing significantly better in regard to his wound. He has been tolerating the dressing changes that he was  performing on his own over the last week. Subsequently we did not wrap his leg at that time although we discussed it. We weren't sure that he was gonna be able to get his pants on top of the dressing. Nonetheless the patient fortunately came today with shorts on he states that he is willing to be wrapped if it  will help the wound to heal faster. He is pleased with the fact that the wound already appears to be doing better it's also not hurting as badly. The last debridement was a little bit more uncomfortable for him unfortunately. Nonetheless he states he's willing to allow me to debride it again today if I do so carefully. 08/24/18 upon evaluation today patient actually appears to be doing rather well in regard to his lower Trinity ulcer on the left. He has been tolerating the dressing changes with Iodoflex including the wrap without complication and the wound [place] into making signs of good progress. Overall I'm very pleased with how things appear at this time. No fevers, chills, nausea, or vomiting noted at this time. 09/03/18 on evaluation today patient actually appears to be doing rather well in regard to his wound. In fact the overall measurement belies the fact that the wound is actually healed quite a bit more than what the measurement would suggest. In fact it's almost half the size that it was during the last evaluation unfortunately a lot of the area where new skin has grown over is in between two areas that had to be clustered together one of the distal portion of the wound has not completely healed up. Nonetheless there is actually quite a bit more healing than what seems to be represented by the wound measurement today. Overall I'm very pleased with the overall progress and how he seems to be doing with the dressing changes. The wrap seems to be of great benefit for him as well. 09/09/18 upon evaluation today patient actually appears to be doing excellent in regard to his left lotion the  ulcer. He has been tolerating the dressing changes without complication. There does not appear to be any evidence of infection overall I think the [facility] Dressing has done excellent for him. I'm very pleased. 09/23/18 evaluation today patient actually appears to be doing poorly in regard to his lower extremity ulcer. There's a lot of maceration and skin breakdown around the actual wound itself which is making it difficult to even tell exactly what is going on. This is something he states started about four days ago. Nonetheless I'm concerned about the possibility of infection just being that he was not having near this amount of drainage previous. He also states in the past 24 hours the pain has been a little bit more significant. No fevers, chills, nausea, or vomiting noted at this time. 09/30/18 on evaluation today patient actually appears to be doing poorly in regard to his lower extremity ulcer. He's been tolerating the dressing changes although there is a lot of tape irritation surrounding the wound area. Subsequently I do think that he would benefit from [place] initiating treatment with all the compression wrap as we did before to avoid any additional injury to the area. No fevers, chills, nausea, or vomiting noted at this time. The patient did have a return of his wound culture which showed Pseudomonas which will not be covered by the Bactrim that I previously prescribed for him. With that being said the patient states that he did not know I was sitting in a prescription for him and therefore never picked this up he also states the pharmacy did not call him. Nonetheless this was something that was sent we will call the pharmacy and having counseling as CLOYD, RAGAS ([id number]) I'm gonna have to send them something different. 10/08/18 on evaluation today patient's ulcer still appears to be much larger than what  it was previous to the deterioration. With that being said the erythema  surrounding the wound bed is not nearly as significant I do believe the antibiotic has been of help for him. He did finally get the Levaquin which is good news. No fevers, chills, nausea, or vomiting noted at this time. 10/12/18 on evaluation today patient actually appears to be doing much better in regard to his lower extremity ulcer. Overall I do believe the dressing change has done well for him at this time. I'm pleased with the overall progress even since just in the last week. 10/19/18 Seen today for follow up and management of left lower leg wound. Wound looks to be improving today and measuring smaller. Decreased surrounding erythema. No s/s of infection. Denies any increased pain, fever, or chills. Electronic Signature(s) Signed: 10/21/2018 12:47:52 AM By: Wilkie Aye FNP-C Entered By: Wilkie Aye on 10/19/2018 10:34:00 Johnathan Scott ([id number]) -------------------------------------------------------------------------------- Physical Exam Details Patient Name: Johnathan Scott Date of Service: 10/19/2018 8:45 AM Medical Record Number: [id number] Patient Account Number: [id number] Date of Birth/Sex: 09-13-1966 (53 y.o. M) Treating RN: Huel Coventry Primary Care Provider: Birdie Sons, ERIC Other Clinician: Referring Provider: Birdie Sons, ERIC Treating Provider/Extender: Youlanda Roys in Treatment: 10 Constitutional appears in no distress. Respiratory Respiratory effort is easy and symmetric bilaterally. Rate is normal at rest and on room air.. Cardiovascular Pedal pulses palpable and strong bilaterally.. Integumentary (Hair, Skin) left lower leg wound. Notes Wound appears to have some improvement. Measuring smaller today with [facility] noted on the surface of the wound. Debrided wound today. Tolerated procedure without any complication and minimal bleeding. Post debridement the wound bed looks better with a more clean surface. to be doing much better and is greatly  improved which is great news Electronic Signature(s) Signed: 10/21/2018 12:47:52 AM By: Wilkie Aye FNP-C Entered By: Wilkie Aye on 10/19/2018 10:36:58 Johnathan Scott ([id number]) -------------------------------------------------------------------------------- Physician Orders Details Patient Name: Johnathan Scott Date of Service: 10/19/2018 8:45 AM Medical Record Number: [id number] Patient Account Number: [id number] Date of Birth/Sex: [birth year] (53 y.o. M) Treating RN: Huel Coventry Primary Care Provider: Birdie Sons, ERIC Other Clinician: Referring Provider: Birdie Sons, ERIC Treating Provider/Extender: Youlanda Roys in Treatment: 10 Verbal / Phone Orders: No Diagnosis Coding Wound Cleansing Wound #1 Left,Anterior Lower Leg o Clean wound with Normal Saline. o May Shower, gently pat wound dry prior to applying new dressing. Anesthetic (add to Medication List) Wound #1 Left,Anterior Lower Leg o Topical Lidocaine 4% cream applied to wound bed prior to debridement (In Clinic Only). Primary Wound Dressing Wound #1 Left,Anterior Lower Leg o Silver Alginate Secondary Dressing Wound #1 Left,Anterior Lower Leg o ABD pad Dressing Change Frequency Wound #1 Left,Anterior Lower Leg o Change dressing every week Follow-up Appointments Wound #1 Left,Anterior Lower Leg o Return Appointment in 1 week. Edema Control Wound #1 Left,Anterior Lower Leg o 3 Layer Compression System - Left Lower Extremity o Elevate legs to the level of the heart and pump ankles as often as possible Off-Loading Wound #1 Left,Anterior Lower Leg o Turn and reposition every 2 hours Electronic Signature(s) Signed: 10/21/2018 12:47:52 AM By: Wilkie Aye FNP-C Entered By: Wilkie Aye on 10/19/2018 10:28:15 Johnathan Scott ([id number]) -------------------------------------------------------------------------------- Problem List Details Patient Name: Johnathan Scott Date of  Service: 10/19/2018 8:45 AM Medical Record Number: [id number] Patient Account Number: [id number] Date of Birth/Sex: October 15, 1966 (53 y.o. M) Treating RN: Huel Coventry Primary Care Provider: Birdie Sons, ERIC Other Clinician: Referring Provider: Birdie Sons, ERIC Treating Provider/Extender: Youlanda Roys in Treatment: 10 Active Problems ICD-10 Evaluated Encounter Code  Description Active Date Today Diagnosis I87.2 Venous insufficiency (chronic) (peripheral) 08/09/2018 No Yes L97.822 Non-pressure chronic ulcer of other part of left lower leg with 08/09/2018 No Yes fat layer exposed D50.9 Iron deficiency anemia, unspecified 08/09/2018 No Yes J45.998 Other asthma 08/09/2018 No Yes Inactive Problems Resolved Problems Electronic Signature(s) Signed: 10/21/2018 12:47:52 AM By: Wilkie Aye FNP-C Entered By: Wilkie Aye on 10/19/2018 10:11:03 Johnathan Scott ([id number]) -------------------------------------------------------------------------------- Progress Note Details Patient Name: Johnathan Scott Date of Service: 10/19/2018 8:45 AM Medical Record Number: [id number] Patient Account Number: [id number] Date of Birth/Sex: May 23, 1966 (53 y.o. M) Treating RN: Huel Coventry Primary Care Provider: Birdie Sons, ERIC Other Clinician: Referring Provider: Birdie Sons, ERIC Treating Provider/Extender: Youlanda Roys in Treatment: 10 Subjective Chief Complaint Information obtained from Patient Left Anterior LE Ulcer Objective Constitutional appears in no distress. Vitals Time Taken: 8:41 AM, Height: 61 in, Weight: 232 lbs, BMI: 43.8, Temperature: 97.9 F, Pulse: 61 bpm, Respiratory Rate: 16 breaths/min, Blood Pressure: 136/69 mmHg. Respiratory Respiratory effort is easy and symmetric bilaterally. Rate is normal at rest and on room air.. Cardiovascular Pedal pulses palpable and strong bilaterally.. General Notes: Wound appears to have some improvement. Measuring smaller today with  [facility] noted on the surface of the wound. Debrided wound today. Tolerated procedure without any complication and minimal bleeding. Post debridement the wound bed looks better with a more clean surface. to be doing much better and is greatly improved which is great news. Integumentary (Hair, Skin) Wound #1 status is Open. Original cause of wound was Gradually Appeared. The wound is located on the Left,Anterior Lower Leg. The wound measures 2cm length x 1.8cm width x 0.1cm depth; 2.827cm^2 area and 0.283cm^3 volume. There is Fat Layer (Subcutaneous Tissue) Exposed exposed. There is no tunneling or undermining noted. There is a large amount of serous drainage noted. The wound margin is flat and intact. There is medium (34-66%) red granulation within the wound bed. There is a medium (34-66%) amount of necrotic tissue within the wound bed including Adherent Slough. The periwound skin appearance exhibited: Hemosiderin Staining. The periwound skin appearance did not exhibit: Callus, Crepitus, Excoriation, Induration, Rash, Scarring, Dry/Scaly, Maceration, Atrophie Blanche, Cyanosis, Ecchymosis, Mottled, Pallor, Rubor, Erythema. Periwound temperature was noted as No Abnormality. The periwound has tenderness on palpation. KHALEEF, STANDAGE ([id number]) Assessment Active Problems ICD-10 Venous insufficiency (chronic) (peripheral) Non-pressure chronic ulcer of other part of left lower leg with fat layer exposed Iron deficiency anemia, unspecified Other asthma Procedures Wound #1 Pre-procedure diagnosis of Wound #1 is a Venous Leg Ulcer located on the Left,Anterior Lower Leg .Severity of Tissue Pre Debridement is: Fat layer exposed. There was a Chemical/Enzymatic/Mechanical debridement performed by Wilkie Aye, FNP. With the following instrument(s): saline and gauze after achieving pain control using Lidocaine. Other agent used was saline and gauze. A time out was conducted at 09:15, prior to the  start of the procedure. There was no bleeding. The procedure was tolerated well. Post Debridement Measurements: 2cm length x 1.8cm width x 0.1cm depth; 0.283cm^3 volume. Character of Wound/Ulcer Post Debridement requires further debridement. Severity of Tissue Post Debridement is: Fat layer exposed. Post procedure Diagnosis Wound #1: Same as Pre-Procedure Pre-procedure diagnosis of Wound #1 is a Venous Leg Ulcer located on the Left,Anterior Lower Leg . There was a Three Layer Compression Therapy Procedure with a pre-treatment ABI of 1.2 by Huel Coventry, RN. Post procedure Diagnosis Wound #1: Same as Pre-Procedure Plan Wound Cleansing: Wound #1 Left,Anterior Lower Leg: Clean wound with Normal Saline. May Shower, gently pat wound dry prior  to applying new dressing. Anesthetic (add to Medication List): Wound #1 Left,Anterior Lower Leg: Topical Lidocaine 4% cream applied to wound bed prior to debridement (In Clinic Only). Primary Wound Dressing: Wound #1 Left,Anterior Lower Leg: Silver Alginate Secondary Dressing: Wound #1 Left,Anterior Lower Leg: ABD pad Dressing Change Frequency: Wound #1 Left,Anterior Lower Leg: Change dressing every week Follow-up Appointments: Wound #1 Left,Anterior Lower Leg: Johnathan SextonSHAMBLEY, Dvaughn ([id number]) Return Appointment in 1 week. Edema Control: Wound #1 Left,Anterior Lower Leg: 3 Layer Compression System - Left Lower Extremity Elevate legs to the level of the heart and pump ankles as often as possible Off-Loading: Wound #1 Left,Anterior Lower Leg: Turn and reposition every 2 hours Electronic Signature(s) Signed: 10/21/2018 12:47:52 AM By: Wilkie AyeKnight, Marra Fraga FNP-C Entered By: Wilkie AyeKnight, Rexton Greulich on 10/19/2018 10:28:26 Johnathan SextonSHAMBLEY, Taydon ([id number]) -------------------------------------------------------------------------------- ROS/PFSH Details Patient Name: Johnathan SextonSHAMBLEY, Demauri Date of Service: 10/19/2018 8:45 AM Medical Record Number: [id number] Patient Account  Number: [id number] Date of Birth/Sex: Mar 02, 1966 (53 y.o. M) Treating RN: Huel CoventryWoody, Kim Primary Care Provider: Birdie SonsSONNENBERG, ERIC Other Clinician: Referring Provider: Birdie SonsSONNENBERG, ERIC Treating Provider/Extender: Youlanda RoysKnight, Angela Vazguez Weeks in Treatment: 10 Information Obtained From Patient Wound History Do you currently have one or more open woundso Yes How many open wounds do you currently haveo 1 Approximately how long have you had your woundso 5 months How have you been treating your wound(s) until nowo open to air Has your wound(s) ever healed and then re-openedo No Have you had any lab work done in the past montho No Have you tested positive for an antibiotic resistant organism (MRSA, VRE)o No Have you tested positive for osteomyelitis (bone infection)o No Have you had any tests for circulation on your legso No Have you had other problems associated with your woundso Swelling Integumentary (Skin) Complaints and Symptoms: Positive for: Wounds - left lower leg wound Medical History: Negative for: History of Burn; History of pressure wounds Constitutional Symptoms (General Health) Complaints and Symptoms: No Complaints or Symptoms Eyes Medical History: Negative for: Cataracts; Glaucoma; Optic Neuritis Ear/Nose/Mouth/Throat Medical History: Negative for: Chronic sinus problems/congestion; Middle ear problems Hematologic/Lymphatic Medical History: Negative for: Anemia; Hemophilia; Human Immunodeficiency Virus; Lymphedema; Sickle Cell Disease Respiratory Complaints and Symptoms: No Complaints or Symptoms Medical History: Positive for: Asthma - history as a child Johnathan SextonSHAMBLEY, Marc ([id number]) Negative for: Aspiration; Chronic Obstructive Pulmonary Disease (COPD); Pneumothorax; Sleep Apnea; Tuberculosis Cardiovascular Complaints and Symptoms: No Complaints or Symptoms Medical History: Negative for: Angina; Arrhythmia; Congestive Heart Failure; Coronary Artery Disease; Deep Vein  Thrombosis; Hypertension; Hypotension; Myocardial Infarction; Peripheral Arterial Disease; Peripheral Venous Disease; Phlebitis; Vasculitis Gastrointestinal Medical History: Negative for: Cirrhosis ; Colitis; Crohnos; Hepatitis A; Hepatitis B; Hepatitis C Endocrine Medical History: Negative for: Type I Diabetes; Type II Diabetes Genitourinary Medical History: Negative for: End Stage Renal Disease Immunological Medical History: Negative for: Lupus Erythematosus; Raynaudos; Scleroderma Musculoskeletal Medical History: Positive for: Osteoarthritis - both hips Negative for: Gout; Rheumatoid Arthritis; Osteomyelitis Neurologic Medical History: Positive for: Seizure Disorder - as a child Negative for: Dementia; Neuropathy; Quadriplegia; Paraplegia Oncologic Medical History: Negative for: Received Chemotherapy; Received Radiation Psychiatric Medical History: Negative for: Anorexia/bulimia; Confinement Anxiety Immunizations Pneumococcal Vaccine: Received Pneumococcal Vaccination: No Implantable Devices Johnathan SextonSHAMBLEY, Cardale ([id number]) Family and Social History Cancer: Yes - Mother,Father,Siblings; Diabetes: Yes - Siblings; Heart Disease: Yes - Siblings; Hereditary Spherocytosis: No; Hypertension: Yes - Siblings; Kidney Disease: No; Lung Disease: No; Seizures: No; Stroke: No; Thyroid Problems: No; Tuberculosis: No; Former smoker - 7 years; Marital Status - Single; Alcohol Use: Rarely; Drug Use: No History; Caffeine Use: Never; Financial  Concerns: No; Food, Clothing or Shelter Needs: No; Support System Lacking: No; Transportation Concerns: No; Advanced Directives: No; Patient does not want information on Advanced Directives Physician Affirmation I have reviewed and agree with the above information. Electronic Signature(s) Signed: 10/19/2018 4:45:10 PM By: Elliot Gurney, BSN, RN, CWS, Kim RN, BSN Signed: 10/21/2018 12:47:52 AM By: Wilkie Aye FNP-C Entered By: Wilkie Aye on 10/19/2018  10:34:28 Johnathan Scott ([id number]) -------------------------------------------------------------------------------- SuperBill Details Patient Name: Johnathan Scott Date of Service: 10/19/2018 Medical Record Number: [id number] Patient Account Number: [id number] Date of Birth/Sex: 1966-03-19 (53 y.o. M) Treating RN: Huel Coventry Primary Care Provider: Birdie Sons, ERIC Other Clinician: Referring Provider: Birdie Sons, ERIC Treating Provider/Extender: Youlanda Roys in Treatment: 10 Diagnosis Coding ICD-10 Codes Code Description I87.2 Venous insufficiency (chronic) (peripheral) L97.822 Non-pressure chronic ulcer of other part of left lower leg with fat layer exposed D50.9 Iron deficiency anemia, unspecified J45.998 Other asthma Facility Procedures CPT4 Code Description: [id number] - DEBRIDE W/O ANES NON SELECT ICD-10 Diagnosis Description L97.822 Non-pressure chronic ulcer of other part of left lower leg with Modifier: fat layer expos Quantity: 1 ed Electronic Signature(s) Signed: 10/21/2018 12:47:52 AM By: Wilkie Aye FNP-C Entered By: Wilkie Aye on 10/19/2018 10:28:36

## 2018-10-21 NOTE — Progress Notes (Signed)
Johnathan Scott ([id number]) Visit Report for 10/19/2018 Arrival Information Details Patient Name: Johnathan Scott, Johnathan Scott Date of Service: 10/19/2018 8:45 AM Medical Record Number: [id number] Patient Account Number: [id number] Date of Birth/Sex: 01-Dec-1965 (53 y.o. M) Treating RN: Huel Coventry Primary Care Aelyn Stanaland: Birdie Sons, ERIC Other Clinician: Referring Kyshaun Barnette: Birdie Sons, ERIC Treating Thierno Hun/Extender: Youlanda Roys in Treatment: 10 Visit Information History Since Last Visit Added or deleted any medications: No Patient Arrived: Cane Any new allergies or adverse reactions: No Arrival Time: 08:39 Had a fall or experienced change in No Accompanied By: self activities of daily living that may affect Transfer Assistance: None risk of falls: Patient Identification Verified: Yes Signs or symptoms of abuse/neglect since last visito No Secondary Verification Process Yes Hospitalized since last visit: No Completed: Implantable device outside of the clinic excluding No Patient Has Alerts: Yes cellular tissue based products placed in the center Patient Alerts: PT REFUSED FACE since last visit: PHOTO Has Dressing in Place as Prescribed: Yes Pain Present Now: No Electronic Signature(s) Signed: 10/19/2018 3:50:59 PM By: Dayton Martes RCP, RRT, CHT Entered By: Dayton Martes on 10/19/2018 08:40:35 Johnathan Scott ([id number]) -------------------------------------------------------------------------------- Compression Therapy Details Patient Name: Johnathan Scott Date of Service: 10/19/2018 8:45 AM Medical Record Number: [id number] Patient Account Number: [id number] Date of Birth/Sex: Sep 04, 1966 (53 y.o. M) Treating RN: Huel Coventry Primary Care Paytyn Mesta: Birdie Sons, ERIC Other Clinician: Referring Bentlee Drier: Birdie Sons, ERIC Treating Denvil Canning/Extender: Youlanda Roys in Treatment: 10 Compression Therapy Performed for Wound Assessment: Wound  #1 Left,Anterior Lower Leg Performed By: Clinician Huel Coventry, RN Compression Type: Three Layer Pre Treatment ABI: 1.2 Post Procedure Diagnosis Same as Pre-procedure Electronic Signature(s) Signed: 10/19/2018 4:45:10 PM By: Elliot Gurney, BSN, RN, CWS, Kim RN, BSN Entered By: Elliot Gurney, BSN, RN, CWS, Kim on 10/19/2018 09:17:44 Johnathan Scott ([id number]) -------------------------------------------------------------------------------- Encounter Discharge Information Details Patient Name: Johnathan Scott Date of Service: 10/19/2018 8:45 AM Medical Record Number: [id number] Patient Account Number: [id number] Date of Birth/Sex: 04/11/1966 (52 y.o. M) Treating RN: Huel Coventry Primary Care Elisa Sorlie: Birdie Sons, ERIC Other Clinician: Referring Christpher Stogsdill: Birdie Sons, ERIC Treating Trisha Morandi/Extender: Youlanda Roys in Treatment: 10 Encounter Discharge Information Items Post Procedure Vitals Discharge Condition: Stable Temperature (F): 97.9 Ambulatory Status: Ambulatory Pulse (bpm): 61 Discharge Destination: Home Respiratory Rate (breaths/min): 16 Transportation: Private Auto Blood Pressure (mmHg): 136/69 Accompanied By: self Schedule Follow-up Appointment: Yes Clinical Summary of Care: Electronic Signature(s) Signed: 10/19/2018 4:45:10 PM By: Elliot Gurney, BSN, RN, CWS, Kim RN, BSN Entered By: Elliot Gurney, BSN, RN, CWS, Kim on 10/19/2018 09:20:43 Johnathan Scott ([id number]) -------------------------------------------------------------------------------- Lower Extremity Assessment Details Patient Name: Johnathan Scott Date of Service: 10/19/2018 8:45 AM Medical Record Number: [id number] Patient Account Number: [id number] Date of Birth/Sex: April 19, 1966 (52 y.o. M) Treating RN: Huel Coventry Primary Care Gerene Nedd: Birdie Sons, ERIC Other Clinician: Referring Axten Pascucci: Birdie Sons, ERIC Treating Machael Raine/Extender: Youlanda Roys in Treatment: 10 Edema Assessment Assessed: [Left: No] [Right:  No] [Left: Edema] [Right: :] Calf Left: Right: Point of Measurement: 34 cm From Medial Instep 53 cm cm Ankle Left: Right: Point of Measurement: 10 cm From Medial Instep 26.6 cm cm Vascular Assessment Pulses: Dorsalis Pedis Palpable: [Left:Yes] Posterior Tibial Extremity colors, hair growth, and conditions: Extremity Color: [Left:Hyperpigmented] Hair Growth on Extremity: [Left:No] Temperature of Extremity: [Left:Warm] Capillary Refill: [Left:< 3 seconds] Toe Nail Assessment Left: Right: Thick: Yes Discolored: Yes Deformed: Yes Improper Length and Hygiene: Yes Electronic Signature(s) Signed: 10/19/2018 4:45:10 PM By: Elliot Gurney, BSN, RN, CWS, Kim RN, BSN Entered By: Elliot Gurney, BSN, RN, CWS, Kim on 10/19/2018 08:52:31 Johnathan Scott ([id number]) -------------------------------------------------------------------------------- Multi Wound  Chart Details Patient Name: Johnathan Scott Date of Service: 10/19/2018 8:45 AM Medical Record Number: [id number] Patient Account Number: [id number] Date of Birth/Sex: [birth year] (53 y.o. M) Treating RN: Huel Coventry Primary Care Caniyah Murley: Birdie Sons, ERIC Other Clinician: Referring Faithlynn Deeley: Birdie Sons, ERIC Treating Denina Rieger/Extender: Youlanda Roys in Treatment: 10 Vital Signs Height(in): 61 Pulse(bpm): 61 Weight(lbs): 232 Blood Pressure(mmHg): 136/69 Body Mass Index(BMI): 44 Temperature(F): 97.9 Respiratory Rate 16 (breaths/min): Photos: [N/A:N/A] Wound Location: Left Lower Leg - Anterior N/A N/A Wounding Event: Gradually Appeared N/A N/A Primary Etiology: Venous Leg Ulcer N/A N/A Comorbid History: Asthma, Osteoarthritis, N/A N/A Seizure Disorder Date Acquired: 03/08/2018 N/A N/A Weeks of Treatment: 10 N/A N/A Wound Status: Open N/A N/A Measurements L x W x D 2x1.8x0.1 N/A N/A (cm) Area (cm) : 2.827 N/A N/A Volume (cm) : 0.283 N/A N/A % Reduction in Area: 71.40% N/A N/A % Reduction in Volume: 85.70% N/A  N/A Classification: Full Thickness Without N/A N/A Exposed Support Structures Exudate Amount: Large N/A N/A Exudate Type: Serous N/A N/A Exudate Color: amber N/A N/A Wound Margin: Flat and Intact N/A N/A Granulation Amount: Medium (34-66%) N/A N/A Granulation Quality: Red N/A N/A Necrotic Amount: Medium (34-66%) N/A N/A Exposed Structures: Fat Layer (Subcutaneous N/A N/A Tissue) Exposed: Yes Fascia: No Tendon: No Muscle: No Millikin, Maisie Fus ([id number]) Joint: No Bone: No Epithelialization: Small (1-33%) N/A N/A Debridement: Chemical/Enzymatic/Mechanical N/A N/A Pre-procedure 09:15 N/A N/A Verification/Time Out Taken: Pain Control: Lidocaine N/A N/A Instrument: Other(saline and gauze) N/A N/A Bleeding: None N/A N/A Debridement Treatment Procedure was tolerated well N/A N/A Response: Post Debridement 2x1.8x0.1 N/A N/A Measurements L x W x D (cm) Post Debridement Volume: 0.283 N/A N/A (cm) Periwound Skin Texture: Excoriation: No N/A N/A Induration: No Callus: No Crepitus: No Rash: No Scarring: No Periwound Skin Moisture: Maceration: No N/A N/A Dry/Scaly: No Periwound Skin Color: Hemosiderin Staining: Yes N/A N/A Atrophie Blanche: No Cyanosis: No Ecchymosis: No Erythema: No Mottled: No Pallor: No Rubor: No Temperature: No Abnormality N/A N/A Tenderness on Palpation: Yes N/A N/A Wound Preparation: Ulcer Cleansing: Other: soap N/A N/A and water Topical Anesthetic Applied: Other: lidocaine 4% Procedures Performed: Compression Therapy N/A N/A Debridement Treatment Notes Wound #1 (Left, Anterior Lower Leg) Notes silvercel, xtrasorb, 3 layer wrap with unna to anchor Electronic Signature(s) Signed: 10/21/2018 12:47:52 AM By: Wilkie Aye FNP-C Entered By: Wilkie Aye on 10/19/2018 10:11:08 Johnathan Scott ([id number]) -------------------------------------------------------------------------------- Multi-Disciplinary Care Plan Details Patient Name:  Johnathan Scott Date of Service: 10/19/2018 8:45 AM Medical Record Number: [id number] Patient Account Number: [id number] Date of Birth/Sex: [birth year] (53 y.o. M) Treating RN: Huel Coventry Primary Care Vena Bassinger: Birdie Sons, ERIC Other Clinician: Referring Lukka Black: Birdie Sons, ERIC Treating Thandiwe Siragusa/Extender: Youlanda Roys in Treatment: 10 Active Inactive Necrotic Tissue Nursing Diagnoses: Knowledge deficit related to management of necrotic/devitalized tissue Goals: Necrotic/devitalized tissue will be minimized in the wound bed Date Initiated: 08/09/2018 Target Resolution Date: 09/09/2018 Goal Status: Active Interventions: Assess patient pain level pre-, during and post procedure and prior to discharge Treatment Activities: Apply topical anesthetic as ordered : 08/09/2018 Excisional debridement : 08/09/2018 Notes: Orientation to the Wound Care Program Nursing Diagnoses: Knowledge deficit related to the wound healing center program Goals: Patient/caregiver will verbalize understanding of the Wound Healing Center Program Date Initiated: 08/09/2018 Target Resolution Date: 09/09/2018 Goal Status: Active Interventions: Provide education on orientation to the wound center Notes: Wound/Skin Impairment Nursing Diagnoses: Impaired tissue integrity Knowledge deficit related to smoking impact on wound healing Goals: Ulcer/skin breakdown will have a volume reduction of 30% by week 4  Johnathan Scott, Johnathan Scott ([id number]) Date Initiated: 08/09/2018 Target Resolution Date: 09/09/2018 Goal Status: Active Interventions: Assess patient/caregiver ability to obtain necessary supplies Assess ulceration(s) every visit Treatment Activities: Topical wound management initiated : 08/09/2018 Notes: Electronic Signature(s) Signed: 10/19/2018 4:45:10 PM By: Elliot GurneyWoody, BSN, RN, CWS, Kim RN, BSN Entered By: Elliot GurneyWoody, BSN, RN, CWS, Kim on 10/19/2018 09:13:18 Johnathan Scott, Johnathan Scott  ([id number]) -------------------------------------------------------------------------------- Pain Assessment Details Patient Name: Johnathan Scott, Johnathan Scott Date of Service: 10/19/2018 8:45 AM Medical Record Number: [id number] Patient Account Number: [id number] Date of Birth/Sex: May 18, 1966 (52 y.o. M) Treating RN: Huel CoventryWoody, Kim Primary Care Arah Aro: Birdie SonsSONNENBERG, ERIC Other Clinician: Referring Tierrah Anastos: Birdie SonsSONNENBERG, ERIC Treating Vinette Crites/Extender: Youlanda RoysKnight, Ikeshia Weeks in Treatment: 10 Active Problems Location of Pain Severity and Description of Pain Patient Has Paino No Site Locations Pain Management and Medication Current Pain Management: Electronic Signature(s) Signed: 10/19/2018 3:50:59 PM By: Dayton MartesWallace, RCP,RRT,CHT, Sallie RCP, RRT, CHT Signed: 10/19/2018 4:45:10 PM By: Elliot GurneyWoody, BSN, RN, CWS, Kim RN, BSN Entered By: Dayton MartesWallace, RCP,RRT,CHT, Sallie on 10/19/2018 08:40:42 Johnathan Scott, Johnathan Scott ([id number]) -------------------------------------------------------------------------------- Patient/Caregiver Education Details Patient Name: Johnathan Scott, Nickson Date of Service: 10/19/2018 8:45 AM Medical Record Number: [id number] Patient Account Number: [id number] Date of Birth/Gender: May 18, 1966 (53 y.o. M) Treating RN: Huel CoventryWoody, Kim Primary Care Physician: Birdie SonsSONNENBERG, ERIC Other Clinician: Referring Physician: Birdie SonsSONNENBERG, ERIC Treating Physician/Extender: Youlanda RoysKnight, Ikeshia Weeks in Treatment: 10 Education Assessment Education Provided To: Patient Education Topics Provided Venous: Handouts: Controlling Swelling with Compression Stockings Methods: Demonstration, Explain/Verbal Responses: State content correctly Wound/Skin Impairment: Handouts: Caring for Your Ulcer Methods: Demonstration Responses: State content correctly Electronic Signature(s) Signed: 10/19/2018 4:45:10 PM By: Elliot GurneyWoody, BSN, RN, CWS, Kim RN, BSN Entered By: Elliot GurneyWoody, BSN, RN, CWS, Kim on 10/19/2018 09:20:02 Johnathan Scott, Johnathan Scott  ([id number]) -------------------------------------------------------------------------------- Wound Assessment Details Patient Name: Johnathan Scott, Whitney Date of Service: 10/19/2018 8:45 AM Medical Record Number: [id number] Patient Account Number: [id number] Date of Birth/Sex: May 18, 1966 (52 y.o. M) Treating RN: Huel CoventryWoody, Kim Primary Care Mareon Robinette: Birdie SonsSONNENBERG, ERIC Other Clinician: Referring Onur Mori: Birdie SonsSONNENBERG, ERIC Treating Fremont Skalicky/Extender: Youlanda RoysKnight, Ikeshia Weeks in Treatment: 10 Wound Status Wound Number: 1 Primary Etiology: Venous Leg Ulcer Wound Location: Left Lower Leg - Anterior Wound Status: Open Wounding Event: Gradually Appeared Comorbid History: Asthma, Osteoarthritis, Seizure Disorder Date Acquired: 03/08/2018 Weeks Of Treatment: 10 Clustered Wound: No Photos Photo Uploaded By: Elliot GurneyWoody, BSN, RN, CWS, Kim on 10/19/2018 09:45:39 Wound Measurements Length: (cm) 2 Width: (cm) 1.8 Depth: (cm) 0.1 Area: (cm) 2.827 Volume: (cm) 0.283 % Reduction in Area: 71.4% % Reduction in Volume: 85.7% Epithelialization: Small (1-33%) Tunneling: No Undermining: No Wound Description Full Thickness Without Exposed Support Foul Odo Classification: Structures Slough/F Wound Margin: Flat and Intact Exudate Large Amount: Exudate Type: Serous Exudate Color: amber r After Cleansing: No ibrino Yes Wound Bed Granulation Amount: Medium (34-66%) Exposed Structure Granulation Quality: Red Fascia Exposed: No Necrotic Amount: Medium (34-66%) Fat Layer (Subcutaneous Tissue) Exposed: Yes Necrotic Quality: Adherent Slough Tendon Exposed: No Muscle Exposed: No Joint Exposed: No Bone Exposed: No Mccleave, Caesar ([id number]) Periwound Skin Texture Texture Color No Abnormalities Noted: No No Abnormalities Noted: No Callus: No Atrophie Blanche: No Crepitus: No Cyanosis: No Excoriation: No Ecchymosis: No Induration: No Erythema: No Rash: No Hemosiderin Staining: Yes Scarring:  No Mottled: No Pallor: No Moisture Rubor: No No Abnormalities Noted: No Dry / Scaly: No Temperature / Pain Maceration: No Temperature: No Abnormality Tenderness on Palpation: Yes Wound Preparation Ulcer Cleansing: Other: soap and water, Topical Anesthetic Applied: Other: lidocaine 4%, Treatment Notes Wound #1 (Left, Anterior Lower Leg) Notes silvercel, xtrasorb, 3 layer wrap with unna to anchor Electronic  Signature(s) Signed: 10/19/2018 4:45:10 PM By: Elliot Gurney, BSN, RN, CWS, Kim RN, BSN Entered By: Elliot Gurney, BSN, RN, CWS, Kim on 10/19/2018 08:50:06 Johnathan Scott ([id number]) -------------------------------------------------------------------------------- Vitals Details Patient Name: Johnathan Scott Date of Service: 10/19/2018 8:45 AM Medical Record Number: [id number] Patient Account Number: [id number] Date of Birth/Sex: [birth year] (52 y.o. M) Treating RN: Huel Coventry Primary Care Mikaela Hilgeman: Birdie Sons, ERIC Other Clinician: Referring Duane Trias: Birdie Sons, ERIC Treating Zakar Brosch/Extender: Youlanda Roys in Treatment: 10 Vital Signs Time Taken: 08:41 Temperature (F): 97.9 Height (in): 61 Pulse (bpm): 61 Weight (lbs): 232 Respiratory Rate (breaths/min): 16 Body Mass Index (BMI): 43.8 Blood Pressure (mmHg): 136/69 Reference Range: 80 - 120 mg / dl Electronic Signature(s) Signed: 10/19/2018 3:50:59 PM By: Dayton Martes RCP, RRT, CHT Entered By: Weyman Rodney, Lucio Edward on 10/19/2018 08:43:49

## 2018-10-26 ENCOUNTER — Encounter: Payer: 59 | Attending: Physician Assistant | Admitting: Physician Assistant

## 2018-10-26 DIAGNOSIS — L97822 Non-pressure chronic ulcer of other part of left lower leg with fat layer exposed: Secondary | ICD-10-CM | POA: Diagnosis not present

## 2018-10-26 DIAGNOSIS — Z87891 Personal history of nicotine dependence: Secondary | ICD-10-CM | POA: Diagnosis not present

## 2018-10-26 DIAGNOSIS — I872 Venous insufficiency (chronic) (peripheral): Secondary | ICD-10-CM | POA: Insufficient documentation

## 2018-10-27 NOTE — Progress Notes (Signed)
CASHMIR, HERTZOG ([id number]) Visit Report for 10/26/2018 Chief Complaint Document Details Patient Name: Johnathan Scott, Johnathan Scott Date of Service: 10/26/2018 2:45 PM Medical Record Number: [id number] Patient Account Number: [id number] Date of Birth/Sex: 1966/06/23 (53 y.o. M) Treating RN: Curtis Sites Primary Care Provider: Birdie Sons, ERIC Other Clinician: Referring Provider: Birdie Sons, ERIC Treating Provider/Extender: Linwood Dibbles, Ozell Ferrera Weeks in Treatment: 11 Information Obtained from: Patient Chief Complaint Left Anterior LE Ulcer Electronic Signature(s) Signed: 10/27/2018 8:35:34 AM By: Lenda Kelp PA-C Entered By: Lenda Kelp on 10/26/2018 14:39:25 Johnathan Scott ([id number]) -------------------------------------------------------------------------------- Debridement Details Patient Name: Johnathan Scott Date of Service: 10/26/2018 2:45 PM Medical Record Number: [id number] Patient Account Number: [id number] Date of Birth/Sex: 28-Sep-1966 (52 y.o. M) Treating RN: Curtis Sites Primary Care Provider: Birdie Sons, ERIC Other Clinician: Referring Provider: Birdie Sons, ERIC Treating Provider/Extender: Linwood Dibbles, Nakira Litzau Weeks in Treatment: 11 Debridement Performed for Wound #1 Left,Anterior Lower Leg Assessment: Performed By: Physician STONE III, Teresa Lemmerman E., PA-C Debridement Type: Debridement Severity of Tissue Pre Fat layer exposed Debridement: Level of Consciousness (Pre- Awake and Alert procedure): Pre-procedure Verification/Time Yes - 15:44 Out Taken: Start Time: 15:44 Pain Control: Lidocaine 4% Topical Solution Total Area Debrided (L x W): 1.5 (cm) x 1.7 (cm) = 2.55 (cm) Tissue and other material Viable, Non-Viable, Subcutaneous debrided: Level: Skin/Subcutaneous Tissue Debridement Description: Excisional Instrument: Curette Bleeding: Minimum Hemostasis Achieved: Pressure End Time: 15:47 Procedural Pain: 0 Post Procedural Pain: 0 Response to Treatment: Procedure was  tolerated well Level of Consciousness Awake and Alert (Post-procedure): Post Debridement Measurements of Total Wound Length: (cm) 1.5 Width: (cm) 1.7 Depth: (cm) 0.2 Volume: (cm) 0.401 Character of Wound/Ulcer Post Debridement: Improved Severity of Tissue Post Debridement: Fat layer exposed Post Procedure Diagnosis Same as Pre-procedure Electronic Signature(s) Signed: 10/26/2018 4:56:07 PM By: Curtis Sites Signed: 10/27/2018 8:35:34 AM By: Lenda Kelp PA-C Entered By: Curtis Sites on 10/26/2018 15:47:29 Johnathan Scott ([id number]) -------------------------------------------------------------------------------- HPI Details Patient Name: Johnathan Scott Date of Service: 10/26/2018 2:45 PM Medical Record Number: [id number] Patient Account Number: [id number] Date of Birth/Sex: 09/17/1966 (53 y.o. M) Treating RN: Curtis Sites Primary Care Provider: Birdie Sons, ERIC Other Clinician: Referring Provider: Birdie Sons, ERIC Treating Provider/Extender: Linwood Dibbles, Gidget Quizhpi Weeks in Treatment: 11 History of Present Illness HPI Description: 08/09/18 on evaluation today patient presents for initial evaluation and clinic as referral concerning an ulcer that he has on the left anterior lower Trinity which has been present for five months. He states that he actually noticed this after having an "insect bite" while driving down the road. He states that he did not see what kind of insect it was but he felt it wrong up his leg and then have the bite. Since that time is been having issues with the fact that this area does not want to heal. He does have a history of asthma as well as anemia is most recent CBC revealed a hemoglobin of 11.1 with a hematocrit of 34.6. He does have a history of panic attacks as well intermittently. No fevers, chills, nausea, or vomiting noted at this time. Specifically the patient does not appear to have any infection in regard to the left lower extremity. He's not on  the current antibiotics. 08/16/18 upon evaluation today patient actually appears to be doing significantly better in regard to his wound. He has been tolerating the dressing changes that he was performing on his own over the last week. Subsequently we did not wrap his leg at that time although we discussed it. We weren't sure that he was gonna be  able to get his pants on top of the dressing. Nonetheless the patient fortunately came today with shorts on he states that he is willing to be wrapped if it will help the wound to heal faster. He is pleased with the fact that the wound already appears to be doing better it's also not hurting as badly. The last debridement was a little bit more uncomfortable for him unfortunately. Nonetheless he states he's willing to allow me to debride it again today if I do so carefully. 08/24/18 upon evaluation today patient actually appears to be doing rather well in regard to his lower Trinity ulcer on the left. He has been tolerating the dressing changes with Iodoflex including the wrap without complication and the wound [place] into making signs of good progress. Overall I'm very pleased with how things appear at this time. No fevers, chills, nausea, or vomiting noted at this time. 09/03/18 on evaluation today patient actually appears to be doing rather well in regard to his wound. In fact the overall measurement belies the fact that the wound is actually healed quite a bit more than what the measurement would suggest. In fact it's almost half the size that it was during the last evaluation unfortunately a lot of the area where new skin has grown over is in between two areas that had to be clustered together one of the distal portion of the wound has not completely healed up. Nonetheless there is actually quite a bit more healing than what seems to be represented by the wound measurement today. Overall I'm very pleased with the overall progress and how he seems to be  doing with the dressing changes. The wrap seems to be of great benefit for him as well. 09/09/18 upon evaluation today patient actually appears to be doing excellent in regard to his left lotion the ulcer. He has been tolerating the dressing changes without complication. There does not appear to be any evidence of infection overall I think the [facility] Dressing has done excellent for him. I'm very pleased. 09/23/18 evaluation today patient actually appears to be doing poorly in regard to his lower extremity ulcer. There's a lot of maceration and skin breakdown around the actual wound itself which is making it difficult to even tell exactly what is going on. This is something he states started about four days ago. Nonetheless I'm concerned about the possibility of infection just being that he was not having near this amount of drainage previous. He also states in the past 24 hours the pain has been a little bit more significant. No fevers, chills, nausea, or vomiting noted at this time. 09/30/18 on evaluation today patient actually appears to be doing poorly in regard to his lower extremity ulcer. He's been tolerating the dressing changes although there is a lot of tape irritation surrounding the wound area. Subsequently I do think that he would benefit from [place] initiating treatment with all the compression wrap as we did before to avoid any additional injury to the area. No fevers, chills, nausea, or vomiting noted at this time. The patient did have a return of his wound culture which showed Pseudomonas which will not be covered by the Bactrim that I previously prescribed for him. With that being said the patient states that he did not know I was sitting in a prescription for him and therefore never picked this up he also states the pharmacy did not call him. Nonetheless this was something that was sent we will call the  pharmacy and having counseling as Johnathan SextonSHAMBLEY, Starling ([id number]) I'm gonna  have to send them something different. 10/08/18 on evaluation today patient's ulcer still appears to be much larger than what it was previous to the deterioration. With that being said the erythema surrounding the wound bed is not nearly as significant I do believe the antibiotic has been of help for him. He did finally get the Levaquin which is good news. No fevers, chills, nausea, or vomiting noted at this time. 10/12/18 on evaluation today patient actually appears to be doing much better in regard to his lower extremity ulcer. Overall I do believe the dressing change has done well for him at this time. I'm pleased with the overall progress even since just in the last week. 10/19/18 Seen today for follow up and management of left lower leg wound. Wound looks to be improving today and measuring smaller. Decreased surrounding erythema. No s/s of infection. Denies any increased pain, fever, or chills. 10/26/18 on evaluation today patient's wound actually appears to be doing very well. Unfortunately he has a new wound on the medial aspect of his lower extremity that is due to the wrap having slid down. He did not contact us as he did not know whether or not we be able to work him in. Nonetheless I told him in the future if this happens the definitely call us and let us know. Electronic Signature(s) Signed: 10/27/2018 8:35:34 AM By: Lenda KelpStone III, Denarius Sesler PA-C Entered By: Lenda KelpStone III, Trice Aspinall on 10/26/2018 15:54:57 Johnathan SextonSHAMBLEY, Johnhenry ([id number]) -------------------------------------------------------------------------------- Physical Exam Details Patient Name: Johnathan SextonSHAMBLEY, Takao Date of Service: 10/26/2018 2:45 PM Medical Record Number: [id number] Patient Account Number: [id number] Date of Birth/Sex: 08/19/1966 (53 y.o. M) Treating RN: Curtis Sitesorthy, Joanna Primary Care Provider: Birdie SonsSONNENBERG, ERIC Other Clinician: Referring Provider: Birdie SonsSONNENBERG, ERIC Treating Provider/Extender: STONE III, Dorice Stiggers Weeks in Treatment:  11 Constitutional Obese and well-hydrated in no acute distress. Respiratory normal breathing without difficulty. Psychiatric this patient is able to make decisions and demonstrates good insight into disease process. Alert and Oriented x 3. pleasant and cooperative. Notes patient's wound bed currently did require sharp debridement which he tolerated today without complication post debridement the wound bed appears to be doing much better. In regard to the new blister no debridement was performed at the site this is actually causing him more discomfort than the main wound that we've been treating. Electronic Signature(s) Signed: 10/27/2018 8:35:34 AM By: Lenda KelpStone III, Lenix Kidd PA-C Entered By: Lenda KelpStone III, Tariana Moldovan on 10/26/2018 15:55:43 Johnathan SextonSHAMBLEY, Shailen ([id number]) -------------------------------------------------------------------------------- Physician Orders Details Patient Name: Johnathan SextonSHAMBLEY, Byren Date of Service: 10/26/2018 2:45 PM Medical Record Number: [id number] Patient Account Number: [id number] Date of Birth/Sex: 08/19/1966 (53 y.o. M) Treating RN: Curtis Sitesorthy, Joanna Primary Care Provider: Birdie SonsSONNENBERG, ERIC Other Clinician: Referring Provider: Birdie SonsSONNENBERG, ERIC Treating Provider/Extender: Linwood DibblesSTONE III, Francenia Chimenti Weeks in Treatment: 11 Verbal / Phone Orders: No Diagnosis Coding ICD-10 Coding Code Description I87.2 Venous insufficiency (chronic) (peripheral) L97.822 Non-pressure chronic ulcer of other part of left lower leg with fat layer exposed D50.9 Iron deficiency anemia, unspecified J45.998 Other asthma Wound Cleansing Wound #1 Left,Anterior Lower Leg o Clean wound with Normal Saline. o May Shower, gently pat wound dry prior to applying new dressing. Wound #3 Left,Medial Lower Leg o Clean wound with Normal Saline. o May Shower, gently pat wound dry prior to applying new dressing. Anesthetic (add to Medication List) Wound #1 Left,Anterior Lower Leg o Topical Lidocaine 4% cream applied  to wound bed prior to debridement (In Clinic Only). Primary Wound Dressing Wound #1 Left,Anterior  Lower Leg o Silver Alginate Wound #3 Left,Medial Lower Leg o Silver Alginate Secondary Dressing Wound #1 Left,Anterior Lower Leg o ABD pad Wound #3 Left,Medial Lower Leg o ABD pad Dressing Change Frequency Wound #1 Left,Anterior Lower Leg o Change dressing every week Wound #3 Left,Medial Lower Leg o Change dressing every week Follow-up Appointments BRICESON, BROADWATER ([id number]) Wound #1 Left,Anterior Lower Leg o Return Appointment in 1 week. Wound #3 Left,Medial Lower Leg o Return Appointment in 1 week. Edema Control Wound #1 Left,Anterior Lower Leg o 3 Layer Compression System - Left Lower Extremity o Elevate legs to the level of the heart and pump ankles as often as possible Wound #3 Left,Medial Lower Leg o 3 Layer Compression System - Left Lower Extremity o Elevate legs to the level of the heart and pump ankles as often as possible Off-Loading Wound #1 Left,Anterior Lower Leg o Turn and reposition every 2 hours Electronic Signature(s) Signed: 10/26/2018 4:56:07 PM By: Curtis Sites Signed: 10/27/2018 8:35:34 AM By: Lenda Kelp PA-C Entered By: Curtis Sites on 10/26/2018 15:48:01 Johnathan Scott ([id number]) -------------------------------------------------------------------------------- Problem List Details Patient Name: Johnathan Scott Date of Service: 10/26/2018 2:45 PM Medical Record Number: [id number] Patient Account Number: [id number] Date of Birth/Sex: 1965/12/30 (52 y.o. M) Treating RN: Curtis Sites Primary Care Provider: Birdie Sons, ERIC Other Clinician: Referring Provider: Birdie Sons, ERIC Treating Provider/Extender: Linwood Dibbles, Tanaja Ganger Weeks in Treatment: 11 Active Problems ICD-10 Evaluated Encounter Code Description Active Date Today Diagnosis I87.2 Venous insufficiency (chronic) (peripheral) 08/09/2018 No Yes L97.822 Non-pressure  chronic ulcer of other part of left lower leg with 08/09/2018 No Yes fat layer exposed D50.9 Iron deficiency anemia, unspecified 08/09/2018 No Yes J45.998 Other asthma 08/09/2018 No Yes Inactive Problems Resolved Problems Electronic Signature(s) Signed: 10/27/2018 8:35:34 AM By: Lenda Kelp PA-C Entered By: Lenda Kelp on 10/26/2018 14:39:20 Johnathan Scott ([id number]) -------------------------------------------------------------------------------- Progress Note Details Patient Name: Johnathan Scott Date of Service: 10/26/2018 2:45 PM Medical Record Number: [id number] Patient Account Number: [id number] Date of Birth/Sex: 1966-01-30 (53 y.o. M) Treating RN: Curtis Sites Primary Care Provider: Birdie Sons, ERIC Other Clinician: Referring Provider: Birdie Sons, ERIC Treating Provider/Extender: Linwood Dibbles, Krysten Veronica Weeks in Treatment: 11 Subjective Chief Complaint Information obtained from Patient Left Anterior LE Ulcer History of Present Illness (HPI) 08/09/18 on evaluation today patient presents for initial evaluation and clinic as referral concerning an ulcer that he has on the left anterior lower Trinity which has been present for five months. He states that he actually noticed this after having an "insect bite" while driving down the road. He states that he did not see what kind of insect it was but he felt it wrong up his leg and then have the bite. Since that time is been having issues with the fact that this area does not want to heal. He does have a history of asthma as well as anemia is most recent CBC revealed a hemoglobin of 11.1 with a hematocrit of 34.6. He does have a history of panic attacks as well intermittently. No fevers, chills, nausea, or vomiting noted at this time. Specifically the patient does not appear to have any infection in regard to the left lower extremity. He's not on the current antibiotics. 08/16/18 upon evaluation today patient actually appears to be  doing significantly better in regard to his wound. He has been tolerating the dressing changes that he was performing on his own over the last week. Subsequently we did not wrap his leg at that time although we discussed it. We weren't sure that  he was gonna be able to get his pants on top of the dressing. Nonetheless the patient fortunately came today with shorts on he states that he is willing to be wrapped if it will help the wound to heal faster. He is pleased with the fact that the wound already appears to be doing better it's also not hurting as badly. The last debridement was a little bit more uncomfortable for him unfortunately. Nonetheless he states he's willing to allow me to debride it again today if I do so carefully. 08/24/18 upon evaluation today patient actually appears to be doing rather well in regard to his lower Trinity ulcer on the left. He has been tolerating the dressing changes with Iodoflex including the wrap without complication and the wound [place] into making signs of good progress. Overall I'm very pleased with how things appear at this time. No fevers, chills, nausea, or vomiting noted at this time. 09/03/18 on evaluation today patient actually appears to be doing rather well in regard to his wound. In fact the overall measurement belies the fact that the wound is actually healed quite a bit more than what the measurement would suggest. In fact it's almost half the size that it was during the last evaluation unfortunately a lot of the area where new skin has grown over is in between two areas that had to be clustered together one of the distal portion of the wound has not completely healed up. Nonetheless there is actually quite a bit more healing than what seems to be represented by the wound measurement today. Overall I'm very pleased with the overall progress and how he seems to be doing with the dressing changes. The wrap seems to be of great benefit for him as  well. 09/09/18 upon evaluation today patient actually appears to be doing excellent in regard to his left lotion the ulcer. He has been tolerating the dressing changes without complication. There does not appear to be any evidence of infection overall I think the [facility] Dressing has done excellent for him. I'm very pleased. 09/23/18 evaluation today patient actually appears to be doing poorly in regard to his lower extremity ulcer. There's a lot of maceration and skin breakdown around the actual wound itself which is making it difficult to even tell exactly what is going on. This is something he states started about four days ago. Nonetheless I'm concerned about the possibility of infection just being that he was not having near this amount of drainage previous. He also states in the past 24 hours the pain has been a little bit more significant. No fevers, chills, nausea, or vomiting noted at this time. 09/30/18 on evaluation today patient actually appears to be doing poorly in regard to his lower extremity ulcer. He's been tolerating the dressing changes although there is a lot of tape irritation surrounding the wound area. Subsequently I do think [facility], Branden ([id number]) that he would benefit from [place] initiating treatment with all the compression wrap as we did before to avoid any additional injury to the area. No fevers, chills, nausea, or vomiting noted at this time. The patient did have a return of his wound culture which showed Pseudomonas which will not be covered by the Bactrim that I previously prescribed for him. With that being said the patient states that he did not know I was sitting in a prescription for him and therefore never picked this up he also states the pharmacy did not call him. Nonetheless this was something  that was sent we will call the pharmacy and having counseling as I'm gonna have to send them something different. 10/08/18 on evaluation today patient's ulcer  still appears to be much larger than what it was previous to the deterioration. With that being said the erythema surrounding the wound bed is not nearly as significant I do believe the antibiotic has been of help for him. He did finally get the Levaquin which is good news. No fevers, chills, nausea, or vomiting noted at this time. 10/12/18 on evaluation today patient actually appears to be doing much better in regard to his lower extremity ulcer. Overall I do believe the dressing change has done well for him at this time. I'm pleased with the overall progress even since just in the last week. 10/19/18 Seen today for follow up and management of left lower leg wound. Wound looks to be improving today and measuring smaller. Decreased surrounding erythema. No s/s of infection. Denies any increased pain, fever, or chills. 10/26/18 on evaluation today patient's wound actually appears to be doing very well. Unfortunately he has a new wound on the medial aspect of his lower extremity that is due to the wrap having slid down. He did not contact us as he did not know whether or not we be able to work him in. Nonetheless I told him in the future if this happens the definitely call us and let us know. Patient History Information obtained from Patient. Family History Cancer - Mother,Father,Siblings, Diabetes - Siblings, Heart Disease - Siblings, Hypertension - Siblings, No family history of Hereditary Spherocytosis, Kidney Disease, Lung Disease, Seizures, Stroke, Thyroid Problems, Tuberculosis. Social History Former smoker - 7 years, Marital Status - Single, Alcohol Use - Rarely, Drug Use - No History, Caffeine Use - Never. Review of Systems (ROS) Constitutional Symptoms (General Health) Denies complaints or symptoms of Fever, Chills. Respiratory The patient has no complaints or symptoms. Cardiovascular Complains or has symptoms of LE edema. Psychiatric The patient has no complaints or  symptoms. Objective Constitutional Obese and well-hydrated in no acute distress. Vitals Time Taken: 2:58 PM, Height: 61 in, Weight: 232 lbs, BMI: 43.8, Temperature: 97.6 F, Pulse: 61 bpm, Respiratory Engh, [place] ([id number]) Rate: 16 breaths/min, Blood Pressure: 157/69 mmHg. Respiratory normal breathing without difficulty. Psychiatric this patient is able to make decisions and demonstrates good insight into disease process. Alert and Oriented x 3. pleasant and cooperative. General Notes: patient's wound bed currently did require sharp debridement which he tolerated today without complication post debridement the wound bed appears to be doing much better. In regard to the new blister no debridement was performed at the site this is actually causing him more discomfort than the main wound that we've been treating. Integumentary (Hair, Skin) Wound #1 status is Open. Original cause of wound was Gradually Appeared. The wound is located on the Left,Anterior Lower Leg. The wound measures 1.5cm length x 1.7cm width x 0.1cm depth; 2.003cm^2 area and 0.2cm^3 volume. There is Fat Layer (Subcutaneous Tissue) Exposed exposed. There is no tunneling or undermining noted. There is a large amount of serous drainage noted. The wound margin is flat and intact. There is medium (34-66%) red granulation within the wound bed. There is a medium (34-66%) amount of necrotic tissue within the wound bed including Adherent Slough. The periwound skin appearance exhibited: Dry/Scaly, Hemosiderin Staining. The periwound skin appearance did not exhibit: Callus, Crepitus, Excoriation, Induration, Rash, Scarring, Maceration, Atrophie Blanche, Cyanosis, Ecchymosis, Mottled, Pallor, Rubor, Erythema. Periwound temperature was noted as No Abnormality.  The periwound has tenderness on palpation. Wound #3 status is Open. Original cause of wound was Gradually Appeared. The wound is located on the Left,Medial Lower Leg. The  wound measures 0.9cm length x 0.8cm width x 0.1cm depth; 0.565cm^2 area and 0.057cm^3 volume. There is Fat Layer (Subcutaneous Tissue) Exposed exposed. There is no tunneling or undermining noted. There is a medium amount of serosanguineous drainage noted. The wound margin is flat and intact. There is small (1-33%) pink granulation within the wound bed. There is no necrotic tissue within the wound bed. The periwound skin appearance did not exhibit: Callus, Crepitus, Excoriation, Induration, Rash, Scarring, Dry/Scaly, Maceration, Atrophie Blanche, Cyanosis, Ecchymosis, Hemosiderin Staining, Mottled, Pallor, Rubor, Erythema. Periwound temperature was noted as No Abnormality. Assessment Active Problems ICD-10 Venous insufficiency (chronic) (peripheral) Non-pressure chronic ulcer of other part of left lower leg with fat layer exposed Iron deficiency anemia, unspecified Other asthma Procedures Wound #1 Pre-procedure diagnosis of Wound #1 is a Venous Leg Ulcer located on the Left,Anterior Lower Leg .Severity of Tissue Pre Debridement is: Fat layer exposed. There was a Excisional Skin/Subcutaneous Tissue Debridement with a total area of 2.55 sq cm performed by STONE III, Shawnelle Spoerl E., PA-C. With the following instrument(s): Curette to remove Viable and Non-Viable tissue/material. Material removed includes Subcutaneous Tissue after achieving pain control using Lidocaine 4% Topical [place], Divine ([id number]) Solution. No specimens were taken. A time out was conducted at 15:44, prior to the start of the procedure. A Minimum amount of bleeding was controlled with Pressure. The procedure was tolerated well with a pain level of 0 throughout and a pain level of 0 following the procedure. Post Debridement Measurements: 1.5cm length x 1.7cm width x 0.2cm depth; 0.401cm^3 volume. Character of Wound/Ulcer Post Debridement is improved. Severity of Tissue Post Debridement is: Fat layer exposed. Post procedure  Diagnosis Wound #1: Same as Pre-Procedure Plan Wound Cleansing: Wound #1 Left,Anterior Lower Leg: Clean wound with Normal Saline. May Shower, gently pat wound dry prior to applying new dressing. Wound #3 Left,Medial Lower Leg: Clean wound with Normal Saline. May Shower, gently pat wound dry prior to applying new dressing. Anesthetic (add to Medication List): Wound #1 Left,Anterior Lower Leg: Topical Lidocaine 4% cream applied to wound bed prior to debridement (In Clinic Only). Primary Wound Dressing: Wound #1 Left,Anterior Lower Leg: Silver Alginate Wound #3 Left,Medial Lower Leg: Silver Alginate Secondary Dressing: Wound #1 Left,Anterior Lower Leg: ABD pad Wound #3 Left,Medial Lower Leg: ABD pad Dressing Change Frequency: Wound #1 Left,Anterior Lower Leg: Change dressing every week Wound #3 Left,Medial Lower Leg: Change dressing every week Follow-up Appointments: Wound #1 Left,Anterior Lower Leg: Return Appointment in 1 week. Wound #3 Left,Medial Lower Leg: Return Appointment in 1 week. Edema Control: Wound #1 Left,Anterior Lower Leg: 3 Layer Compression System - Left Lower Extremity Elevate legs to the level of the heart and pump ankles as often as possible Wound #3 Left,Medial Lower Leg: 3 Layer Compression System - Left Lower Extremity Elevate legs to the level of the heart and pump ankles as often as possible Off-Loading: Wound #1 Left,Anterior Lower Leg: Turn and reposition every 2 hours [place], Maisie Fus ([id number]) My suggestion at this point is gonna be that we continue with the Current wound care measures since he seems to be doing so well. Patient is in agreement that plan. If anything changes or worsens meantime he will contact the office and let us know and in the future he said so definitely let us know if the wrap slips down. Of note  he does tell me that he went crawling under his house in order to check why his heat was not working to be honest this may be  part of the reason why the wrap slid. Please see above for specific wound care orders. We will see patient for re-evaluation in 1 week(s) here in the clinic. If anything worsens or changes patient will contact our office for additional recommendations. Electronic Signature(s) Signed: 10/27/2018 8:35:34 AM By: Lenda Kelp PA-C Entered By: Lenda Kelp on 10/26/2018 15:56:21 Johnathan Scott ([id number]) -------------------------------------------------------------------------------- ROS/PFSH Details Patient Name: Johnathan Scott Date of Service: 10/26/2018 2:45 PM Medical Record Number: [id number] Patient Account Number: [id number] Date of Birth/Sex: [birth year] (53 y.o. M) Treating RN: Curtis Sites Primary Care Provider: Birdie Sons, ERIC Other Clinician: Referring Provider: Birdie Sons, ERIC Treating Provider/Extender: Linwood Dibbles, Dava Rensch Weeks in Treatment: 11 Information Obtained From Patient Wound History Do you currently have one or more open woundso Yes How many open wounds do you currently haveo 1 Approximately how long have you had your woundso 5 months How have you been treating your wound(s) until nowo open to air Has your wound(s) ever healed and then re-openedo No Have you had any lab work done in the past montho No Have you tested positive for an antibiotic resistant organism (MRSA, VRE)o No Have you tested positive for osteomyelitis (bone infection)o No Have you had any tests for circulation on your legso No Have you had other problems associated with your woundso Swelling Constitutional Symptoms (General Health) Complaints and Symptoms: Negative for: Fever; Chills Cardiovascular Complaints and Symptoms: Positive for: LE edema Medical History: Negative for: Angina; Arrhythmia; Congestive Heart Failure; Coronary Artery Disease; Deep Vein Thrombosis; Hypertension; Hypotension; Myocardial Infarction; Peripheral Arterial Disease; Peripheral Venous Disease;  Phlebitis; Vasculitis Eyes Medical History: Negative for: Cataracts; Glaucoma; Optic Neuritis Ear/Nose/Mouth/Throat Medical History: Negative for: Chronic sinus problems/congestion; Middle ear problems Hematologic/Lymphatic Medical History: Negative for: Anemia; Hemophilia; Human Immunodeficiency Virus; Lymphedema; Sickle Cell Disease Respiratory Complaints and Symptoms: No Complaints or Symptoms PEARLIE, LAFOSSE ([id number]) Medical History: Positive for: Asthma - history as a child Negative for: Aspiration; Chronic Obstructive Pulmonary Disease (COPD); Pneumothorax; Sleep Apnea; Tuberculosis Gastrointestinal Medical History: Negative for: Cirrhosis ; Colitis; Crohnos; Hepatitis A; Hepatitis B; Hepatitis C Endocrine Medical History: Negative for: Type I Diabetes; Type II Diabetes Genitourinary Medical History: Negative for: End Stage Renal Disease Immunological Medical History: Negative for: Lupus Erythematosus; Raynaudos; Scleroderma Integumentary (Skin) Medical History: Negative for: History of Burn; History of pressure wounds Musculoskeletal Medical History: Positive for: Osteoarthritis - both hips Negative for: Gout; Rheumatoid Arthritis; Osteomyelitis Neurologic Medical History: Positive for: Seizure Disorder - as a child Negative for: Dementia; Neuropathy; Quadriplegia; Paraplegia Oncologic Medical History: Negative for: Received Chemotherapy; Received Radiation Psychiatric Complaints and Symptoms: No Complaints or Symptoms Medical History: Negative for: Anorexia/bulimia; Confinement Anxiety Immunizations Pneumococcal Vaccine: Received Pneumococcal Vaccination: No Implantable Devices HAZIM, TREADWAY ([id number]) Family and Social History Cancer: Yes - Mother,Father,Siblings; Diabetes: Yes - Siblings; Heart Disease: Yes - Siblings; Hereditary Spherocytosis: No; Hypertension: Yes - Siblings; Kidney Disease: No; Lung Disease: No; Seizures: No; Stroke: No;  Thyroid Problems: No; Tuberculosis: No; Former smoker - 7 years; Marital Status - Single; Alcohol Use: Rarely; Drug Use: No History; Caffeine Use: Never; Financial Concerns: No; Food, Clothing or Shelter Needs: No; Support System Lacking: No; Transportation Concerns: No; Advanced Directives: No; Patient does not want information on Advanced Directives Physician Affirmation I have reviewed and agree with the above information. Electronic Signature(s) Signed: 10/26/2018 4:56:07 PM By: Curtis Sites Signed:  10/27/2018 8:35:34 AM By: Lenda Kelp PA-C Entered By: Lenda Kelp on 10/26/2018 15:55:12 Johnathan Scott ([id number]) -------------------------------------------------------------------------------- SuperBill Details Patient Name: Johnathan Scott Date of Service: 10/26/2018 Medical Record Number: [id number] Patient Account Number: [id number] Date of Birth/Sex: [birth year] (53 y.o. M) Treating RN: Curtis Sites Primary Care Provider: Birdie Sons, ERIC Other Clinician: Referring Provider: Birdie Sons, ERIC Treating Provider/Extender: Linwood Dibbles, Isrrael Fluckiger Weeks in Treatment: 11 Diagnosis Coding ICD-10 Codes Code Description I87.2 Venous insufficiency (chronic) (peripheral) L97.822 Non-pressure chronic ulcer of other part of left lower leg with fat layer exposed D50.9 Iron deficiency anemia, unspecified J45.998 Other asthma Facility Procedures CPT4 Code Description: 58099833 11042 - DEB SUBQ TISSUE 20 SQ CM/< ICD-10 Diagnosis Description L97.822 Non-pressure chronic ulcer of other part of left lower leg with Modifier: fat layer expos Quantity: 1 ed Physician Procedures CPT4 Code Description: [id number] 11042 - WC PHYS SUBQ TISS 20 SQ CM ICD-10 Diagnosis Description L97.822 Non-pressure chronic ulcer of other part of left lower leg with Modifier: fat layer expos Quantity: 1 ed Electronic Signature(s) Signed: 10/27/2018 8:35:34 AM By: Lenda Kelp PA-C Entered By: Lenda Kelp on  10/26/2018 15:56:32

## 2018-10-29 NOTE — Progress Notes (Signed)
JERMERE, MESCHKE ([id number]) Visit Report for 10/26/2018 Arrival Information Details Patient Name: Johnathan Scott Date of Service: 10/26/2018 2:45 PM Medical Record Number: [id number] Patient Account Number: [id number] Date of Birth/Sex: 28-Jul-1966 (53 y.o. M) Treating RN: Curtis Sites Primary Care Lorell Thibodaux: Birdie Sons, ERIC Other Clinician: Referring Jalaina Salyers: Birdie Sons, ERIC Treating Jobeth Pangilinan/Extender: Linwood Dibbles, HOYT Weeks in Treatment: 11 Visit Information History Since Last Visit Added or deleted any medications: No Patient Arrived: Cane Any new allergies or adverse reactions: No Arrival Time: 14:57 Had a fall or experienced change in No Accompanied By: self activities of daily living that may affect Transfer Assistance: None risk of falls: Patient Identification Verified: Yes Signs or symptoms of abuse/neglect since last visito No Secondary Verification Process Yes Hospitalized since last visit: No Completed: Implantable device outside of the clinic excluding No Patient Has Alerts: Yes cellular tissue based products placed in the center Patient Alerts: PT REFUSED FACE since last visit: PHOTO Has Dressing in Place as Prescribed: Yes Pain Present Now: No Electronic Signature(s) Signed: 10/26/2018 3:06:21 PM By: Dayton Martes RCP, RRT, CHT Entered By: Dayton Martes on 10/26/2018 14:57:53 Johnathan Scott ([id number]) -------------------------------------------------------------------------------- Encounter Discharge Information Details Patient Name: Johnathan Scott Date of Service: 10/26/2018 2:45 PM Medical Record Number: [id number] Patient Account Number: [id number] Date of Birth/Sex: November 26, 1965 (53 y.o. M) Treating RN: Curtis Sites Primary Care Bonny Egger: Birdie Sons, ERIC Other Clinician: Referring Jacquetta Polhamus: Birdie Sons, ERIC Treating Ziyah Cordoba/Extender: Linwood Dibbles, HOYT Weeks in Treatment: 11 Encounter Discharge Information Items Post  Procedure Vitals Discharge Condition: Stable Temperature (F): 97.6 Ambulatory Status: Cane Pulse (bpm): 61 Discharge Destination: Home Respiratory Rate (breaths/min): 18 Transportation: Private Auto Blood Pressure (mmHg): 157/69 Accompanied By: self Schedule Follow-up Appointment: Yes Clinical Summary of Care: Electronic Signature(s) Signed: 10/26/2018 4:56:07 PM By: Curtis Sites Entered By: Curtis Sites on 10/26/2018 15:49:17 Johnathan Scott ([id number]) -------------------------------------------------------------------------------- Lower Extremity Assessment Details Patient Name: Johnathan Scott Date of Service: 10/26/2018 2:45 PM Medical Record Number: [id number] Patient Account Number: [id number] Date of Birth/Sex: 1966/09/25 (53 y.o. M) Treating RN: Arnette Norris Primary Care Issaic Welliver: Birdie Sons ERIC Other Clinician: Referring Carlton Sweaney: Birdie Sons, ERIC Treating Tito Ausmus/Extender: Linwood Dibbles, HOYT Weeks in Treatment: 11 Electronic Signature(s) Signed: 10/28/2018 2:41:13 PM By: Arnette Norris Entered By: Arnette Norris on 10/26/2018 15:25:25 Johnathan Scott ([id number]) -------------------------------------------------------------------------------- Multi Wound Chart Details Patient Name: Johnathan Scott Date of Service: 10/26/2018 2:45 PM Medical Record Number: [id number] Patient Account Number: [id number] Date of Birth/Sex: 11/19/1965 (53 y.o. M) Treating RN: Curtis Sites Primary Care Chloe Flis: Birdie Sons, ERIC Other Clinician: Referring Latravia Southgate: Birdie Sons, ERIC Treating Sheronica Corey/Extender: Linwood Dibbles, HOYT Weeks in Treatment: 11 Vital Signs Height(in): 61 Pulse(bpm): 61 Weight(lbs): 232 Blood Pressure(mmHg): 157/69 Body Mass Index(BMI): 44 Temperature(F): 97.6 Respiratory Rate 16 (breaths/min): Photos: [1:No Photos] [3:No Photos] [N/A:N/A] Wound Location: [1:Left Lower Leg - Anterior] [3:Left Lower Leg - Medial] [N/A:N/A] Wounding Event: [1:Gradually  Appeared] [3:Gradually Appeared] [N/A:N/A] Primary Etiology: [1:Venous Leg Ulcer] [3:Venous Leg Ulcer] [N/A:N/A] Comorbid History: [1:Asthma, Osteoarthritis, Seizure Disorder] [3:Asthma, Osteoarthritis, Seizure Disorder] [N/A:N/A] Date Acquired: [1:03/08/2018] [3:10/26/2018] [N/A:N/A] Weeks of Treatment: [1:11] [3:0] [N/A:N/A] Wound Status: [1:Open] [3:Open] [N/A:N/A] Measurements L x W x D [1:1.5x1.7x0.1] [3:0.9x0.8x0.1] [N/A:N/A] (cm) Area (cm) : [1:2.003] [3:0.565] [N/A:N/A] Volume (cm) : [1:0.2] [3:0.057] [N/A:N/A] % Reduction in Area: [1:79.80%] [3:N/A] [N/A:N/A] % Reduction in Volume: [1:89.90%] [3:N/A] [N/A:N/A] Classification: [1:Full Thickness Without Exposed Support Structures] [3:Partial Thickness] [N/A:N/A] Exudate Amount: [1:Large] [3:Medium] [N/A:N/A] Exudate Type: [1:Serous] [3:Serosanguineous] [N/A:N/A] Exudate Color: [1:amber] [3:red, brown] [N/A:N/A] Wound Margin: [1:Flat and Intact] [3:Flat and Intact] [N/A:N/A] Granulation Amount: [1:Medium (34-66%)] [3:Small (  1-33%)] [N/A:N/A] Granulation Quality: [1:Red] [3:Pink] [N/A:N/A] Necrotic Amount: [1:Medium (34-66%)] [3:None Present (0%)] [N/A:N/A] Exposed Structures: [1:Fat Layer (Subcutaneous Tissue) Exposed: Yes Fascia: No Tendon: No Muscle: No Joint: No Bone: No] [3:Fat Layer (Subcutaneous Tissue) Exposed: Yes Fascia: No Tendon: No Muscle: No Joint: No Bone: No] [N/A:N/A] Epithelialization: [1:Small (1-33%)] [3:Small (1-33%)] [N/A:N/A] Periwound Skin Texture: [1:Excoriation: No Induration: No Callus: No Crepitus: No] [3:Excoriation: No Induration: No Callus: No Crepitus: No] [N/A:N/A] Rash: No Rash: No Scarring: No Scarring: No Periwound Skin Moisture: Dry/Scaly: Yes Maceration: No N/A Maceration: No Dry/Scaly: No Periwound Skin Color: Hemosiderin Staining: Yes Atrophie Blanche: No N/A Atrophie Blanche: No Cyanosis: No Cyanosis: No Ecchymosis: No Ecchymosis: No Erythema: No Erythema: No Hemosiderin  Staining: No Mottled: No Mottled: No Pallor: No Pallor: No Rubor: No Rubor: No Temperature: No Abnormality No Abnormality N/A Tenderness on Palpation: Yes No N/A Wound Preparation: Ulcer Cleansing: Other: soap Ulcer Cleansing: N/A and water Rinsed/Irrigated with Saline Topical Anesthetic Applied: Topical Anesthetic Applied: Other: lidocaine 4% Other: lidocaine 4% Treatment Notes Electronic Signature(s) Signed: 10/26/2018 4:56:07 PM By: Curtis Sites Entered By: Curtis Sites on 10/26/2018 15:43:10 Johnathan Scott ([id number]) -------------------------------------------------------------------------------- Multi-Disciplinary Care Plan Details Patient Name: Johnathan Scott Date of Service: 10/26/2018 2:45 PM Medical Record Number: [id number] Patient Account Number: [id number] Date of Birth/Sex: Jun 22, 1966 (53 y.o. M) Treating RN: Curtis Sites Primary Care Rayleigh Gillyard: Birdie Sons, ERIC Other Clinician: Referring Dalary Hollar: Birdie Sons, ERIC Treating Rifky Lapre/Extender: Linwood Dibbles, HOYT Weeks in Treatment: 11 Active Inactive Necrotic Tissue Nursing Diagnoses: Knowledge deficit related to management of necrotic/devitalized tissue Goals: Necrotic/devitalized tissue will be minimized in the wound bed Date Initiated: 08/09/2018 Target Resolution Date: 09/09/2018 Goal Status: Active Interventions: Assess patient pain level pre-, during and post procedure and prior to discharge Treatment Activities: Apply topical anesthetic as ordered : 08/09/2018 Excisional debridement : 08/09/2018 Notes: Orientation to the Wound Care Program Nursing Diagnoses: Knowledge deficit related to the wound healing center program Goals: Patient/caregiver will verbalize understanding of the Wound Healing Center Program Date Initiated: 08/09/2018 Target Resolution Date: 09/09/2018 Goal Status: Active Interventions: Provide education on orientation to the wound center Notes: Wound/Skin  Impairment Nursing Diagnoses: Impaired tissue integrity Knowledge deficit related to smoking impact on wound healing Goals: Ulcer/skin breakdown will have a volume reduction of 30% by week 4 DAWON, TROOP ([id number]) Date Initiated: 08/09/2018 Target Resolution Date: 09/09/2018 Goal Status: Active Interventions: Assess patient/caregiver ability to obtain necessary supplies Assess ulceration(s) every visit Treatment Activities: Topical wound management initiated : 08/09/2018 Notes: Electronic Signature(s) Signed: 10/26/2018 4:56:07 PM By: Curtis Sites Entered By: Curtis Sites on 10/26/2018 15:42:56 Johnathan Scott ([id number]) -------------------------------------------------------------------------------- Pain Assessment Details Patient Name: Johnathan Scott Date of Service: 10/26/2018 2:45 PM Medical Record Number: [id number] Patient Account Number: [id number] Date of Birth/Sex: June 14, 1966 (53 y.o. M) Treating RN: Curtis Sites Primary Care Lennis Rader: Birdie Sons, ERIC Other Clinician: Referring Elen Acero: Birdie Sons, ERIC Treating Geralyn Figiel/Extender: Linwood Dibbles, HOYT Weeks in Treatment: 11 Active Problems Location of Pain Severity and Description of Pain Patient Has Paino No Site Locations Pain Management and Medication Current Pain Management: Electronic Signature(s) Signed: 10/26/2018 3:06:21 PM By: Sallee Provencal, RRT, CHT Signed: 10/26/2018 4:56:07 PM By: Curtis Sites Entered By: Dayton Martes on 10/26/2018 14:58:01 Johnathan Scott ([id number]) -------------------------------------------------------------------------------- Patient/Caregiver Education Details Patient Name: Johnathan Scott Date of Service: 10/26/2018 2:45 PM Medical Record Number: [id number] Patient Account Number: [id number] Date of Birth/Gender: 02-01-1966 (53 y.o. M) Treating RN: Curtis Sites Primary Care Physician: Birdie Sons, ERIC Other Clinician: Referring  Physician: Birdie Sons, ERIC Treating Physician/Extender: Linwood Dibbles, HOYT  Weeks in Treatment: 11 Education Assessment Education Provided To: Patient Education Topics Provided Venous: Handouts: Other: neeed for ongoing compression Methods: Explain/Verbal Responses: State content correctly Electronic Signature(s) Signed: 10/26/2018 4:56:07 PM By: Curtis Sites Entered By: Curtis Sites on 10/26/2018 15:48:25 Johnathan Scott ([id number]) -------------------------------------------------------------------------------- Wound Assessment Details Patient Name: Johnathan Scott Date of Service: 10/26/2018 2:45 PM Medical Record Number: [id number] Patient Account Number: [id number] Date of Birth/Sex: 27-Apr-1966 (53 y.o. M) Treating RN: Arnette Norris Primary Care Erikson Danzy: Birdie Sons, ERIC Other Clinician: Referring Issa Kosmicki: Birdie Sons, ERIC Treating Geanine Vandekamp/Extender: Linwood Dibbles, HOYT Weeks in Treatment: 11 Wound Status Wound Number: 1 Primary Etiology: Venous Leg Ulcer Wound Location: Left Lower Leg - Anterior Wound Status: Open Wounding Event: Gradually Appeared Comorbid History: Asthma, Osteoarthritis, Seizure Disorder Date Acquired: 03/08/2018 Weeks Of Treatment: 11 Clustered Wound: No Photos Photo Uploaded By: Arnette Norris on 10/26/2018 16:25:28 Wound Measurements Length: (cm) 1.5 Width: (cm) 1.7 Depth: (cm) 0.1 Area: (cm) 2.003 Volume: (cm) 0.2 % Reduction in Area: 79.8% % Reduction in Volume: 89.9% Epithelialization: Small (1-33%) Tunneling: No Undermining: No Wound Description Full Thickness Without Exposed Support Classification: Structures Wound Margin: Flat and Intact Exudate Large Amount: Exudate Type: Serous Exudate Color: amber Foul Odor After Cleansing: No Slough/Fibrino Yes Wound Bed Granulation Amount: Medium (34-66%) Exposed Structure Granulation Quality: Red Fascia Exposed: No Necrotic Amount: Medium (34-66%) Fat Layer (Subcutaneous  Tissue) Exposed: Yes Necrotic Quality: Adherent Slough Tendon Exposed: No Muscle Exposed: No Joint Exposed: No Bone Exposed: No Castro, Keiland ([id number]) Periwound Skin Texture Texture Color No Abnormalities Noted: No No Abnormalities Noted: No Callus: No Atrophie Blanche: No Crepitus: No Cyanosis: No Excoriation: No Ecchymosis: No Induration: No Erythema: No Rash: No Hemosiderin Staining: Yes Scarring: No Mottled: No Pallor: No Moisture Rubor: No No Abnormalities Noted: No Dry / Scaly: Yes Temperature / Pain Maceration: No Temperature: No Abnormality Tenderness on Palpation: Yes Wound Preparation Ulcer Cleansing: Other: soap and water, Topical Anesthetic Applied: Other: lidocaine 4%, Treatment Notes Wound #1 (Left, Anterior Lower Leg) Notes silvercel, abd, 3 layer wrap with unna to anchor Electronic Signature(s) Signed: 10/28/2018 2:41:13 PM By: Arnette Norris Entered By: Arnette Norris on 10/26/2018 15:21:08 Johnathan Scott ([id number]) -------------------------------------------------------------------------------- Wound Assessment Details Patient Name: Johnathan Scott Date of Service: 10/26/2018 2:45 PM Medical Record Number: [id number] Patient Account Number: [id number] Date of Birth/Sex: September 26, 1966 (53 y.o. M) Treating RN: Arnette Norris Primary Care Curt Oatis: Birdie Sons, ERIC Other Clinician: Referring Ronnie Doo: Birdie Sons, ERIC Treating Nicoles Sedlacek/Extender: Linwood Dibbles, HOYT Weeks in Treatment: 11 Wound Status Wound Number: 3 Primary Etiology: Venous Leg Ulcer Wound Location: Left Lower Leg - Medial Wound Status: Open Wounding Event: Gradually Appeared Comorbid History: Asthma, Osteoarthritis, Seizure Disorder Date Acquired: 10/26/2018 Weeks Of Treatment: 0 Clustered Wound: No Photos Photo Uploaded By: Arnette Norris on 10/26/2018 16:25:29 Wound Measurements Length: (cm) 0.9 Width: (cm) 0.8 Depth: (cm) 0.1 Area: (cm) 0.565 Volume: (cm)  0.057 % Reduction in Area: % Reduction in Volume: Epithelialization: Small (1-33%) Tunneling: No Undermining: No Wound Description Classification: Partial Thickness Wound Margin: Flat and Intact Exudate Amount: Medium Exudate Type: Serosanguineous Exudate Color: red, brown Foul Odor After Cleansing: No Slough/Fibrino No Wound Bed Granulation Amount: Small (1-33%) Exposed Structure Granulation Quality: Pink Fascia Exposed: No Necrotic Amount: None Present (0%) Fat Layer (Subcutaneous Tissue) Exposed: Yes Tendon Exposed: No Muscle Exposed: No Joint Exposed: No Bone Exposed: No Periwound Skin Texture Ha, Khamari ([id number]) Texture Color No Abnormalities Noted: No No Abnormalities Noted: No Callus: No Atrophie Blanche: No Crepitus: No Cyanosis: No Excoriation: No Ecchymosis: No Induration: No  Erythema: No Rash: No Hemosiderin Staining: No Scarring: No Mottled: No Pallor: No Moisture Rubor: No No Abnormalities Noted: No Dry / Scaly: No Temperature / Pain Maceration: No Temperature: No Abnormality Wound Preparation Ulcer Cleansing: Rinsed/Irrigated with Saline Topical Anesthetic Applied: Other: lidocaine 4%, Treatment Notes Wound #3 (Left, Medial Lower Leg) Notes silvercel, abd, 3 layer wrap with unna to anchor Electronic Signature(s) Signed: 10/28/2018 2:41:13 PM By: Arnette NorrisBiell, Kristina Entered By: Arnette NorrisBiell, Kristina on 10/26/2018 15:25:14 Johnathan SextonSHAMBLEY, Johnathan Scott ([id number]) -------------------------------------------------------------------------------- Vitals Details Patient Name: Johnathan SextonSHAMBLEY, Jachin Date of Service: 10/26/2018 2:45 PM Medical Record Number: [id number] Patient Account Number: [id number] Date of Birth/Sex: December 10, 1965 (53 y.o. M) Treating RN: Curtis Sitesorthy, Joanna Primary Care Tyqwan Pink: Birdie SonsSONNENBERG, ERIC Other Clinician: Referring Ahsan Esterline: Birdie SonsSONNENBERG, ERIC Treating Vernel Donlan/Extender: Linwood DibblesSTONE III, HOYT Weeks in Treatment: 11 Vital Signs Time Taken:  14:58 Temperature (F): 97.6 Height (in): 61 Pulse (bpm): 61 Weight (lbs): 232 Respiratory Rate (breaths/min): 16 Body Mass Index (BMI): 43.8 Blood Pressure (mmHg): 157/69 Reference Range: 80 - 120 mg / dl Electronic Signature(s) Signed: 10/26/2018 3:06:21 PM By: Dayton MartesWallace, RCP,RRT,CHT, Sallie RCP, RRT, CHT Entered By: Dayton MartesWallace, RCP,RRT,CHT, Sallie on 10/26/2018 15:01:08

## 2018-11-01 ENCOUNTER — Encounter: Payer: 59 | Admitting: Physician Assistant

## 2018-11-01 DIAGNOSIS — L97821 Non-pressure chronic ulcer of other part of left lower leg limited to breakdown of skin: Secondary | ICD-10-CM | POA: Diagnosis not present

## 2018-11-01 DIAGNOSIS — L97822 Non-pressure chronic ulcer of other part of left lower leg with fat layer exposed: Secondary | ICD-10-CM | POA: Diagnosis not present

## 2018-11-01 DIAGNOSIS — I872 Venous insufficiency (chronic) (peripheral): Secondary | ICD-10-CM | POA: Diagnosis not present

## 2018-11-01 NOTE — Progress Notes (Signed)
CURTIES, CONIGLIARO ([id number]) Visit Report for 11/01/2018 Chief Complaint Document Details Patient Name: Johnathan Scott, Johnathan Scott Date of Service: 11/01/2018 1:30 PM Medical Record Number: [id number] Patient Account Number: [id number] Date of Birth/Sex: 05/15/1966 (53 y.o. M) Treating RN: Huel Coventry Primary Care Provider: Birdie Sons, ERIC Other Clinician: Referring Provider: Birdie Sons, ERIC Treating Provider/Extender: Lenda Kelp Weeks in Treatment: 12 Information Obtained from: Patient Chief Complaint Left Anterior LE Ulcer Electronic Signature(s) Signed: 11/01/2018 3:59:30 PM By: Lenda Kelp PA-C Entered By: Lenda Kelp on 11/01/2018 13:49:32 Johnathan Scott ([id number]) -------------------------------------------------------------------------------- Debridement Details Patient Name: Johnathan Scott Date of Service: 11/01/2018 1:30 PM Medical Record Number: [id number] Patient Account Number: [id number] Date of Birth/Sex: January 13, 1966 (52 y.o. M) Treating RN: Arnette Norris Primary Care Provider: Birdie Sons, ERIC Other Clinician: Referring Provider: Birdie Sons, ERIC Treating Provider/Extender: Linwood Dibbles, HOYT Weeks in Treatment: 12 Debridement Performed for Wound #1 Left,Anterior Lower Leg Assessment: Performed By: Physician STONE III, HOYT E., PA-C Debridement Type: Debridement Severity of Tissue Pre Fat layer exposed Debridement: Level of Consciousness (Pre- Awake and Alert procedure): Pre-procedure Verification/Time Yes - 14:01 Out Taken: Start Time: 14:01 Pain Control: Lidocaine Total Area Debrided (L x W): 1.5 (cm) x 1.5 (cm) = 2.25 (cm) Tissue and other material Viable, Non-Viable, Slough, Subcutaneous, Slough debrided: Level: Skin/Subcutaneous Tissue Debridement Description: Excisional Instrument: Curette Bleeding: Minimum Hemostasis Achieved: Pressure End Time: 14:03 Procedural Pain: 0 Post Procedural Pain: 0 Response to Treatment: Procedure was  tolerated well Level of Consciousness Awake and Alert (Post-procedure): Post Debridement Measurements of Total Wound Length: (cm) 1.5 Width: (cm) 1.5 Depth: (cm) 0.1 Volume: (cm) 0.177 Character of Wound/Ulcer Post Debridement: Improved Severity of Tissue Post Debridement: Fat layer exposed Post Procedure Diagnosis Same as Pre-procedure Electronic Signature(s) Signed: 11/01/2018 3:50:02 PM By: Arnette Norris Signed: 11/01/2018 3:59:30 PM By: Lenda Kelp PA-C Entered By: Arnette Norris on 11/01/2018 14:03:21 Johnathan Scott ([id number]) -------------------------------------------------------------------------------- HPI Details Patient Name: Johnathan Scott Date of Service: 11/01/2018 1:30 PM Medical Record Number: [id number] Patient Account Number: [id number] Date of Birth/Sex: 03/04/1966 (53 y.o. M) Treating RN: Huel Coventry Primary Care Provider: Birdie Sons, ERIC Other Clinician: Referring Provider: Birdie Sons, ERIC Treating Provider/Extender: Linwood Dibbles, HOYT Weeks in Treatment: 12 History of Present Illness HPI Description: 08/09/18 on evaluation today patient presents for initial evaluation and clinic as referral concerning an ulcer that he has on the left anterior lower Trinity which has been present for five months. He states that he actually noticed this after having an "insect bite" while driving down the road. He states that he did not see what kind of insect it was but he felt it wrong up his leg and then have the bite. Since that time is been having issues with the fact that this area does not want to heal. He does have a history of asthma as well as anemia is most recent CBC revealed a hemoglobin of 11.1 with a hematocrit of 34.6. He does have a history of panic attacks as well intermittently. No fevers, chills, nausea, or vomiting noted at this time. Specifically the patient does not appear to have any infection in regard to the left lower extremity. He's not on  the current antibiotics. 08/16/18 upon evaluation today patient actually appears to be doing significantly better in regard to his wound. He has been tolerating the dressing changes that he was performing on his own over the last week. Subsequently we did not wrap his leg at that time although we discussed it. We weren't sure that he was gonna be able  to get his pants on top of the dressing. Nonetheless the patient fortunately came today with shorts on he states that he is willing to be wrapped if it will help the wound to heal faster. He is pleased with the fact that the wound already appears to be doing better it's also not hurting as badly. The last debridement was a little bit more uncomfortable for him unfortunately. Nonetheless he states he's willing to allow me to debride it again today if I do so carefully. 08/24/18 upon evaluation today patient actually appears to be doing rather well in regard to his lower Trinity ulcer on the left. He has been tolerating the dressing changes with Iodoflex including the wrap without complication and the wound [place] into making signs of good progress. Overall I'm very pleased with how things appear at this time. No fevers, chills, nausea, or vomiting noted at this time. 09/03/18 on evaluation today patient actually appears to be doing rather well in regard to his wound. In fact the overall measurement belies the fact that the wound is actually healed quite a bit more than what the measurement would suggest. In fact it's almost half the size that it was during the last evaluation unfortunately a lot of the area where new skin has grown over is in between two areas that had to be clustered together one of the distal portion of the wound has not completely healed up. Nonetheless there is actually quite a bit more healing than what seems to be represented by the wound measurement today. Overall I'm very pleased with the overall progress and how he seems to be  doing with the dressing changes. The wrap seems to be of great benefit for him as well. 09/09/18 upon evaluation today patient actually appears to be doing excellent in regard to his left lotion the ulcer. He has been tolerating the dressing changes without complication. There does not appear to be any evidence of infection overall I think the [facility] Dressing has done excellent for him. I'm very pleased. 09/23/18 evaluation today patient actually appears to be doing poorly in regard to his lower extremity ulcer. There's a lot of maceration and skin breakdown around the actual wound itself which is making it difficult to even tell exactly what is going on. This is something he states started about four days ago. Nonetheless I'm concerned about the possibility of infection just being that he was not having near this amount of drainage previous. He also states in the past 24 hours the pain has been a little bit more significant. No fevers, chills, nausea, or vomiting noted at this time. 09/30/18 on evaluation today patient actually appears to be doing poorly in regard to his lower extremity ulcer. He's been tolerating the dressing changes although there is a lot of tape irritation surrounding the wound area. Subsequently I do think that he would benefit from [place] initiating treatment with all the compression wrap as we did before to avoid any additional injury to the area. No fevers, chills, nausea, or vomiting noted at this time. The patient did have a return of his wound culture which showed Pseudomonas which will not be covered by the Bactrim that I previously prescribed for him. With that being said the patient states that he did not know I was sitting in a prescription for him and therefore never picked this up he also states the pharmacy did not call him. Nonetheless this was something that was sent we will call the pharmacy  and having counseling as GAYLIN, BULTHUIS ([id number]) I'm gonna  have to send them something different. 10/08/18 on evaluation today patient's ulcer still appears to be much larger than what it was previous to the deterioration. With that being said the erythema surrounding the wound bed is not nearly as significant I do believe the antibiotic has been of help for him. He did finally get the Levaquin which is good news. No fevers, chills, nausea, or vomiting noted at this time. 10/12/18 on evaluation today patient actually appears to be doing much better in regard to his lower extremity ulcer. Overall I do believe the dressing change has done well for him at this time. I'm pleased with the overall progress even since just in the last week. 10/19/18 Seen today for follow up and management of left lower leg wound. Wound looks to be improving today and measuring smaller. Decreased surrounding erythema. No s/s of infection. Denies any increased pain, fever, or chills. 10/26/18 on evaluation today patient's wound actually appears to be doing very well. Unfortunately he has a new wound on the medial aspect of his lower extremity that is due to the wrap having slid down. He did not contact us as he did not know whether or not we be able to work him in. Nonetheless I told him in the future if this happens the definitely call us and let us know. 11/01/18 on evaluation today patient's lower Trinity ulcer appears to show signs of improvement compared to last week's evaluation. In fact the new area which blistered and arose last week appears to likely be completely healed at this time. Fortunately there is no sign of infection which is also good news. Overall I'm very pleased with how things seem to be progressing. Electronic Signature(s) Signed: 11/01/2018 3:59:30 PM By: Lenda Kelp PA-C Entered By: Lenda Kelp on 11/01/2018 15:56:39 Johnathan Scott ([id number]) -------------------------------------------------------------------------------- Physical Exam  Details Patient Name: Johnathan Scott Date of Service: 11/01/2018 1:30 PM Medical Record Number: [id number] Patient Account Number: [id number] Date of Birth/Sex: [birth year] (53 y.o. M) Treating RN: Huel Coventry Primary Care Provider: Birdie Sons, ERIC Other Clinician: Referring Provider: Birdie Sons, ERIC Treating Provider/Extender: STONE III, HOYT Weeks in Treatment: 12 Constitutional Well-nourished and well-hydrated in no acute distress. Respiratory normal breathing without difficulty. Psychiatric this patient is able to make decisions and demonstrates good insight into disease process. Alert and Oriented x 3. pleasant and cooperative. Notes Patient's wound bed did require sharp debridement today he tolerated this without complication. Post debridement the wound bed appears to be doing significantly better which is great news. Electronic Signature(s) Signed: 11/01/2018 3:59:30 PM By: Lenda Kelp PA-C Entered By: Lenda Kelp on 11/01/2018 15:57:05 Johnathan Scott ([id number]) -------------------------------------------------------------------------------- Physician Orders Details Patient Name: Johnathan Scott Date of Service: 11/01/2018 1:30 PM Medical Record Number: [id number] Patient Account Number: [id number] Date of Birth/Sex: Mar 03, 1966 (53 y.o. M) Treating RN: Arnette Norris Primary Care Provider: Birdie Sons, ERIC Other Clinician: Referring Provider: Birdie Sons, ERIC Treating Provider/Extender: Linwood Dibbles, HOYT Weeks in Treatment: 12 Verbal / Phone Orders: No Diagnosis Coding ICD-10 Coding Code Description I87.2 Venous insufficiency (chronic) (peripheral) L97.822 Non-pressure chronic ulcer of other part of left lower leg with fat layer exposed D50.9 Iron deficiency anemia, unspecified J45.998 Other asthma Wound Cleansing Wound #1 Left,Anterior Lower Leg o Clean wound with Normal Saline. o May Shower, gently pat wound dry prior to applying new dressing. Wound  #3 Left,Medial Lower Leg o Clean wound with Normal Saline. o May Shower, gently pat wound dry  prior to applying new dressing. Anesthetic (add to Medication List) Wound #1 Left,Anterior Lower Leg o Topical Lidocaine 4% cream applied to wound bed prior to debridement (In Clinic Only). Primary Wound Dressing Wound #1 Left,Anterior Lower Leg o Silver Alginate Wound #3 Left,Medial Lower Leg o Silver Alginate Secondary Dressing Wound #1 Left,Anterior Lower Leg o ABD pad Wound #3 Left,Medial Lower Leg o ABD pad Dressing Change Frequency Wound #1 Left,Anterior Lower Leg o Change dressing every week Wound #3 Left,Medial Lower Leg o Change dressing every week Follow-up Appointments Johnathan SextonSHAMBLEY, Kollin ([id number]) Wound #1 Left,Anterior Lower Leg o Return Appointment in 1 week. Wound #3 Left,Medial Lower Leg o Return Appointment in 1 week. Edema Control Wound #1 Left,Anterior Lower Leg o 3 Layer Compression System - Left Lower Extremity o Elevate legs to the level of the heart and pump ankles as often as possible Wound #3 Left,Medial Lower Leg o 3 Layer Compression System - Left Lower Extremity o Elevate legs to the level of the heart and pump ankles as often as possible Off-Loading Wound #1 Left,Anterior Lower Leg o Turn and reposition every 2 hours Electronic Signature(s) Signed: 11/01/2018 3:50:02 PM By: Arnette NorrisBiell, Kristina Signed: 11/01/2018 3:59:30 PM By: Lenda KelpStone III, Hoyt PA-C Entered By: Arnette NorrisBiell, Kristina on 11/01/2018 14:05:23 Johnathan SextonSHAMBLEY, Benen ([id number]) -------------------------------------------------------------------------------- Problem List Details Patient Name: Johnathan SextonSHAMBLEY, Bryndon Date of Service: 11/01/2018 1:30 PM Medical Record Number: [id number] Patient Account Number: [id number] Date of Birth/Sex: 1966/06/22 (53 y.o. M) Treating RN: Huel CoventryWoody, Kim Primary Care Provider: Birdie SonsSONNENBERG, ERIC Other Clinician: Referring Provider: Birdie SonsSONNENBERG,  ERIC Treating Provider/Extender: Linwood DibblesSTONE III, HOYT Weeks in Treatment: 12 Active Problems ICD-10 Evaluated Encounter Code Description Active Date Today Diagnosis I87.2 Venous insufficiency (chronic) (peripheral) 08/09/2018 No Yes L97.822 Non-pressure chronic ulcer of other part of left lower leg with 08/09/2018 No Yes fat layer exposed D50.9 Iron deficiency anemia, unspecified 08/09/2018 No Yes J45.998 Other asthma 08/09/2018 No Yes Inactive Problems Resolved Problems Electronic Signature(s) Signed: 11/01/2018 3:59:30 PM By: Lenda KelpStone III, Hoyt PA-C Entered By: Lenda KelpStone III, Hoyt on 11/01/2018 13:49:26 Johnathan SextonSHAMBLEY, Luciano ([id number]) -------------------------------------------------------------------------------- Progress Note Details Patient Name: Johnathan SextonSHAMBLEY, Yoshua Date of Service: 11/01/2018 1:30 PM Medical Record Number: [id number] Patient Account Number: [id number] Date of Birth/Sex: 1966/06/22 (53 y.o. M) Treating RN: Huel CoventryWoody, Kim Primary Care Provider: Birdie SonsSONNENBERG, ERIC Other Clinician: Referring Provider: Birdie SonsSONNENBERG, ERIC Treating Provider/Extender: Linwood DibblesSTONE III, HOYT Weeks in Treatment: 12 Subjective Chief Complaint Information obtained from Patient Left Anterior LE Ulcer History of Present Illness (HPI) 08/09/18 on evaluation today patient presents for initial evaluation and clinic as referral concerning an ulcer that he has on the left anterior lower Trinity which has been present for five months. He states that he actually noticed this after having an "insect bite" while driving down the road. He states that he did not see what kind of insect it was but he felt it wrong up his leg and then have the bite. Since that time is been having issues with the fact that this area does not want to heal. He does have a history of asthma as well as anemia is most recent CBC revealed a hemoglobin of 11.1 with a hematocrit of 34.6. He does have a history of panic attacks as well intermittently. No  fevers, chills, nausea, or vomiting noted at this time. Specifically the patient does not appear to have any infection in regard to the left lower extremity. He's not on the current antibiotics. 08/16/18 upon evaluation today patient actually appears to be doing significantly better in regard to his wound.  He has been tolerating the dressing changes that he was performing on his own over the last week. Subsequently we did not wrap his leg at that time although we discussed it. We weren't sure that he was gonna be able to get his pants on top of the dressing. Nonetheless the patient fortunately came today with shorts on he states that he is willing to be wrapped if it will help the wound to heal faster. He is pleased with the fact that the wound already appears to be doing better it's also not hurting as badly. The last debridement was a little bit more uncomfortable for him unfortunately. Nonetheless he states he's willing to allow me to debride it again today if I do so carefully. 08/24/18 upon evaluation today patient actually appears to be doing rather well in regard to his lower Trinity ulcer on the left. He has been tolerating the dressing changes with Iodoflex including the wrap without complication and the wound us into making signs of good progress. Overall I'm very pleased with how things appear at this time. No fevers, chills, nausea, or vomiting noted at this time. 09/03/18 on evaluation today patient actually appears to be doing rather well in regard to his wound. In fact the overall measurement belies the fact that the wound is actually healed quite a bit more than what the measurement would suggest. In fact it's almost half the size that it was during the last evaluation unfortunately a lot of the area where new skin has grown over is in between two areas that had to be clustered together one of the distal portion of the wound has not completely healed up. Nonetheless there is actually  quite a bit more healing than what seems to be represented by the wound measurement today. Overall I'm very pleased with the overall progress and how he seems to be doing with the dressing changes. The wrap seems to be of great benefit for him as well. 09/09/18 upon evaluation today patient actually appears to be doing excellent in regard to his left lotion the ulcer. He has been tolerating the dressing changes without complication. There does not appear to be any evidence of infection overall I think [facility] done excellent for him. I'm very pleased. 09/23/18 evaluation today patient actually appears to be doing poorly in regard to his lower extremity ulcer. There's a lot of maceration and skin breakdown around the actual wound itself which is making it difficult to even tell exactly what is going on. This is something he states started about four days ago. Nonetheless I'm concerned about the possibility of infection just being that he was not having near this amount of drainage previous. He also states in the past 24 hours the pain has been a little bit more significant. No fevers, chills, nausea, or vomiting noted at this time. 09/30/18 on evaluation today patient actually appears to be doing poorly in regard to his lower extremity ulcer. He's been tolerating the dressing changes although there is a lot of tape irritation surrounding the wound area. Subsequently I do think [facility]AM[facility], Kerrick ([id number]) that he would benefit from us initiating treatment with all the compression wrap as we did before to avoid any additional injury to the area. No fevers, chills, nausea, or vomiting noted at this time. The patient did have a return of his wound culture which showed Pseudomonas which will not be covered by the Bactrim that I previously prescribed for him. With that being  said the patient states that he did not know I was sitting in a prescription for him and therefore never  picked this up he also states the pharmacy did not call him. Nonetheless this was something that was sent we will call the pharmacy and having counseling as I'm gonna have to send them something different. 10/08/18 on evaluation today patient's ulcer still appears to be much larger than what it was previous to the deterioration. With that being said the erythema surrounding the wound bed is not nearly as significant I do believe the antibiotic has been of help for him. He did finally get the Levaquin which is good news. No fevers, chills, nausea, or vomiting noted at this time. 10/12/18 on evaluation today patient actually appears to be doing much better in regard to his lower extremity ulcer. Overall I do believe the dressing change has done well for him at this time. I'm pleased with the overall progress even since just in the last week. 10/19/18 Seen today for follow up and management of left lower leg wound. Wound looks to be improving today and measuring smaller. Decreased surrounding erythema. No s/s of infection. Denies any increased pain, fever, or chills. 10/26/18 on evaluation today patient's wound actually appears to be doing very well. Unfortunately he has a new wound on the medial aspect of his lower extremity that is due to the wrap having slid down. He did not contact us as he did not know whether or not we be able to work him in. Nonetheless I told him in the future if this happens the definitely call us and let us know. 11/01/18 on evaluation today patient's lower Trinity ulcer appears to show signs of improvement compared to last week's evaluation. In fact the new area which blistered and arose last week appears to likely be completely healed at this time. Fortunately there is no sign of infection which is also good news. Overall I'm very pleased with how things seem to be progressing. Patient History Information obtained from Patient. Family History Cancer -  Mother,Father,Siblings, Diabetes - Siblings, Heart Disease - Siblings, Hypertension - Siblings, No family history of Hereditary Spherocytosis, Kidney Disease, Lung Disease, Seizures, Stroke, Thyroid Problems, Tuberculosis. Social History Former smoker - 7 years, Marital Status - Single, Alcohol Use - Rarely, Drug Use - No History, Caffeine Use - Never. Review of Systems (ROS) Constitutional Symptoms (General Health) Denies complaints or symptoms of Fever, Chills. Respiratory The patient has no complaints or symptoms. Cardiovascular Complains or has symptoms of LE edema. Psychiatric The patient has no complaints or symptoms. Objective DEVUN, ANNA ([id number]) Constitutional Well-nourished and well-hydrated in no acute distress. Vitals Time Taken: 1:30 PM, Height: 61 in, Weight: 232 lbs, BMI: 43.8, Temperature: 98.1 F, Pulse: 51 bpm, Respiratory Rate: 16 breaths/min, Blood Pressure: 155/73 mmHg. Respiratory normal breathing without difficulty. Psychiatric this patient is able to make decisions and demonstrates good insight into disease process. Alert and Oriented x 3. pleasant and cooperative. General Notes: Patient's wound bed did require sharp debridement today he tolerated this without complication. Post debridement the wound bed appears to be doing significantly better which is great news. Integumentary (Hair, Skin) Wound #1 status is Open. Original cause of wound was Gradually Appeared. The wound is located on the Left,Anterior Lower Leg. The wound measures 1.5cm length x 1.5cm width x 0.1cm depth; 1.767cm^2 area and 0.177cm^3 volume. There is Fat Layer (Subcutaneous Tissue) Exposed exposed. There is no tunneling or undermining noted. There is a medium amount of  serous drainage noted. The wound margin is flat and intact. There is medium (34-66%) red granulation within the wound bed. There is a medium (34-66%) amount of necrotic tissue within the wound bed including Adherent  Slough. The periwound skin appearance exhibited: Dry/Scaly, Hemosiderin Staining. The periwound skin appearance did not exhibit: Callus, Crepitus, Excoriation, Induration, Rash, Scarring, Maceration, Atrophie Blanche, Cyanosis, Ecchymosis, Mottled, Pallor, Rubor, Erythema. Periwound temperature was noted as No Abnormality. The periwound has tenderness on palpation. Wound #3 status is Healed - Epithelialized. Original cause of wound was Gradually Appeared. The wound is located on the Left,Medial Lower Leg. The wound measures 0cm length x 0cm width x 0cm depth; 0cm^2 area and 0cm^3 volume. There is Fat Layer (Subcutaneous Tissue) Exposed exposed. There is no tunneling or undermining noted. There is a medium amount of serosanguineous drainage noted. The wound margin is flat and intact. There is no granulation within the wound bed. There is no necrotic tissue within the wound bed. The periwound skin appearance did not exhibit: Callus, Crepitus, Excoriation, Induration, Rash, Scarring, Dry/Scaly, Maceration, Atrophie Blanche, Cyanosis, Ecchymosis, Hemosiderin Staining, Mottled, Pallor, Rubor, Erythema. Periwound temperature was noted as No Abnormality. Assessment Active Problems ICD-10 Venous insufficiency (chronic) (peripheral) Non-pressure chronic ulcer of other part of left lower leg with fat layer exposed Iron deficiency anemia, unspecified Other asthma Procedures Wound #1 Nedeau, Maisie Fus ([id number]) Pre-procedure diagnosis of Wound #1 is a Venous Leg Ulcer located on the Left,Anterior Lower Leg .Severity of Tissue Pre Debridement is: Fat layer exposed. There was a Excisional Skin/Subcutaneous Tissue Debridement with a total area of 2.25 sq cm performed by STONE III, HOYT E., PA-C. With the following instrument(s): Curette to remove Viable and Non-Viable tissue/material. Material removed includes Subcutaneous Tissue and Slough and after achieving pain control using Lidocaine. No specimens  were taken. A time out was conducted at 14:01, prior to the start of the procedure. A Minimum amount of bleeding was controlled with Pressure. The procedure was tolerated well with a pain level of 0 throughout and a pain level of 0 following the procedure. Post Debridement Measurements: 1.5cm length x 1.5cm width x 0.1cm depth; 0.177cm^3 volume. Character of Wound/Ulcer Post Debridement is improved. Severity of Tissue Post Debridement is: Fat layer exposed. Post procedure Diagnosis Wound #1: Same as Pre-Procedure Plan Wound Cleansing: Wound #1 Left,Anterior Lower Leg: Clean wound with Normal Saline. May Shower, gently pat wound dry prior to applying new dressing. Wound #3 Left,Medial Lower Leg: Clean wound with Normal Saline. May Shower, gently pat wound dry prior to applying new dressing. Anesthetic (add to Medication List): Wound #1 Left,Anterior Lower Leg: Topical Lidocaine 4% cream applied to wound bed prior to debridement (In Clinic Only). Primary Wound Dressing: Wound #1 Left,Anterior Lower Leg: Silver Alginate Wound #3 Left,Medial Lower Leg: Silver Alginate Secondary Dressing: Wound #1 Left,Anterior Lower Leg: ABD pad Wound #3 Left,Medial Lower Leg: ABD pad Dressing Change Frequency: Wound #1 Left,Anterior Lower Leg: Change dressing every week Wound #3 Left,Medial Lower Leg: Change dressing every week Follow-up Appointments: Wound #1 Left,Anterior Lower Leg: Return Appointment in 1 week. Wound #3 Left,Medial Lower Leg: Return Appointment in 1 week. Edema Control: Wound #1 Left,Anterior Lower Leg: 3 Layer Compression System - Left Lower Extremity Elevate legs to the level of the heart and pump ankles as often as possible Wound #3 Left,Medial Lower Leg: 3 Layer Compression System - Left Lower Extremity Elevate legs to the level of the heart and pump ankles as often as possible Off-Loading: Wound #1 Left,Anterior Lower Leg:  Turn and reposition every 2 hours Pall,  Brenyn ([id number]) My suggestion at this point is gonna be that we continue with the above wound care measures and he is in agreement with that plan. If anything changes or worsens meantime he will let me know otherwise will see were things stand at follow-up. Please see above for specific wound care orders. We will see patient for re-evaluation in 1 week(s) here in the clinic. If anything worsens or changes patient will contact our office for additional recommendations. Electronic Signature(s) Signed: 11/01/2018 3:59:30 PM By: Lenda Kelp PA-C Entered By: Lenda Kelp on 11/01/2018 15:57:15 Johnathan Scott ([id number]) -------------------------------------------------------------------------------- ROS/PFSH Details Patient Name: Johnathan Scott Date of Service: 11/01/2018 1:30 PM Medical Record Number: [id number] Patient Account Number: [id number] Date of Birth/Sex: [birth year] (52 y.o. M) Treating RN: Huel Coventry Primary Care Provider: Birdie Sons, ERIC Other Clinician: Referring Provider: Birdie Sons, ERIC Treating Provider/Extender: Linwood Dibbles, HOYT Weeks in Treatment: 12 Information Obtained From Patient Wound History Do you currently have one or more open woundso Yes How many open wounds do you currently haveo 1 Approximately how long have you had your woundso 5 months How have you been treating your wound(s) until nowo open to air Has your wound(s) ever healed and then re-openedo No Have you had any lab work done in the past montho No Have you tested positive for an antibiotic resistant organism (MRSA, VRE)o No Have you tested positive for osteomyelitis (bone infection)o No Have you had any tests for circulation on your legso No Have you had other problems associated with your woundso Swelling Constitutional Symptoms (General Health) Complaints and Symptoms: Negative for: Fever; Chills Cardiovascular Complaints and Symptoms: Positive for: LE edema Medical  History: Negative for: Angina; Arrhythmia; Congestive Heart Failure; Coronary Artery Disease; Deep Vein Thrombosis; Hypertension; Hypotension; Myocardial Infarction; Peripheral Arterial Disease; Peripheral Venous Disease; Phlebitis; Vasculitis Eyes Medical History: Negative for: Cataracts; Glaucoma; Optic Neuritis Ear/Nose/Mouth/Throat Medical History: Negative for: Chronic sinus problems/congestion; Middle ear problems Hematologic/Lymphatic Medical History: Negative for: Anemia; Hemophilia; Human Immunodeficiency Virus; Lymphedema; Sickle Cell Disease Respiratory Complaints and Symptoms: No Complaints or Symptoms JUDEA, FANG ([id number]) Medical History: Positive for: Asthma - history as a child Negative for: Aspiration; Chronic Obstructive Pulmonary Disease (COPD); Pneumothorax; Sleep Apnea; Tuberculosis Gastrointestinal Medical History: Negative for: Cirrhosis ; Colitis; Crohnos; Hepatitis A; Hepatitis B; Hepatitis C Endocrine Medical History: Negative for: Type I Diabetes; Type II Diabetes Genitourinary Medical History: Negative for: End Stage Renal Disease Immunological Medical History: Negative for: Lupus Erythematosus; Raynaudos; Scleroderma Integumentary (Skin) Medical History: Negative for: History of Burn; History of pressure wounds Musculoskeletal Medical History: Positive for: Osteoarthritis - both hips Negative for: Gout; Rheumatoid Arthritis; Osteomyelitis Neurologic Medical History: Positive for: Seizure Disorder - as a child Negative for: Dementia; Neuropathy; Quadriplegia; Paraplegia Oncologic Medical History: Negative for: Received Chemotherapy; Received Radiation Psychiatric Complaints and Symptoms: No Complaints or Symptoms Medical History: Negative for: Anorexia/bulimia; Confinement Anxiety Immunizations Pneumococcal Vaccine: Received Pneumococcal Vaccination: No Implantable Devices ENDERSON, RIOPELLE ([id number]) Family and Social  History Cancer: Yes - Mother,Father,Siblings; Diabetes: Yes - Siblings; Heart Disease: Yes - Siblings; Hereditary Spherocytosis: No; Hypertension: Yes - Siblings; Kidney Disease: No; Lung Disease: No; Seizures: No; Stroke: No; Thyroid Problems: No; Tuberculosis: No; Former smoker - 7 years; Marital Status - Single; Alcohol Use: Rarely; Drug Use: No History; Caffeine Use: Never; Financial Concerns: No; Food, Clothing or Shelter Needs: No; Support System Lacking: No; Transportation Concerns: No; Advanced Directives: No; Patient does not want information on Advanced Directives Physician Affirmation I  have reviewed and agree with the above information. Electronic Signature(s) Signed: 11/01/2018 3:59:30 PM By: Lenda Kelp PA-C Signed: 11/01/2018 4:05:56 PM By: Elliot Gurney BSN, RN, CWS, Kim RN, BSN Entered By: Lenda Kelp on 11/01/2018 15:56:55 Johnathan Scott ([id number]) -------------------------------------------------------------------------------- SuperBill Details Patient Name: Johnathan Scott Date of Service: 11/01/2018 Medical Record Number: [id number] Patient Account Number: [id number] Date of Birth/Sex: [birth year] (52 y.o. M) Treating RN: Huel Coventry Primary Care Provider: Birdie Sons, ERIC Other Clinician: Referring Provider: Birdie Sons, ERIC Treating Provider/Extender: Linwood Dibbles, HOYT Weeks in Treatment: 12 Diagnosis Coding ICD-10 Codes Code Description I87.2 Venous insufficiency (chronic) (peripheral) L97.822 Non-pressure chronic ulcer of other part of left lower leg with fat layer exposed D50.9 Iron deficiency anemia, unspecified J45.998 Other asthma Facility Procedures CPT4 Code Description: 81017510 11042 - DEB SUBQ TISSUE 20 SQ CM/< ICD-10 Diagnosis Description L97.822 Non-pressure chronic ulcer of other part of left lower leg with Modifier: fat layer expos Quantity: 1 ed Physician Procedures CPT4 Code Description: [id number] 11042 - WC PHYS SUBQ TISS 20 SQ CM ICD-10  Diagnosis Description L97.822 Non-pressure chronic ulcer of other part of left lower leg with Modifier: fat layer expos Quantity: 1 ed Electronic Signature(s) Signed: 11/01/2018 3:59:30 PM By: Lenda Kelp PA-C Entered By: Lenda Kelp on 11/01/2018 15:57:22

## 2018-11-04 ENCOUNTER — Ambulatory Visit: Payer: 59

## 2018-11-04 NOTE — Progress Notes (Signed)
AZAD, CALAME ([id number]) Visit Report for 11/01/2018 Arrival Information Details Patient Name: Johnathan Scott, Johnathan Scott Date of Service: 11/01/2018 1:30 PM Medical Record Number: [id number] Patient Account Number: [id number] Date of Birth/Sex: 1966-07-04 (53 y.o. M) Treating RN: Huel Coventry Primary Care Abrianna Sidman: Birdie Sons, ERIC Other Clinician: Referring Geneve Kimpel: Birdie Sons, ERIC Treating Nyree Applegate/Extender: Linwood Dibbles, HOYT Weeks in Treatment: 12 Visit Information History Since Last Visit Added or deleted any medications: No Patient Arrived: Cane Any new allergies or adverse reactions: No Arrival Time: 13:29 Had a fall or experienced change in No Accompanied By: self activities of daily living that may affect Transfer Assistance: None risk of falls: Patient Identification Verified: Yes Signs or symptoms of abuse/neglect since last visito No Secondary Verification Process Yes Hospitalized since last visit: No Completed: Implantable device outside of the clinic excluding No Patient Has Alerts: Yes cellular tissue based products placed in the center Patient Alerts: PT REFUSED FACE since last visit: PHOTO Has Dressing in Place as Prescribed: Yes Pain Present Now: No Electronic Signature(s) Signed: 11/01/2018 3:03:36 PM By: Dayton Martes RCP, RRT, CHT Entered By: Dayton Martes on 11/01/2018 13:30:02 Johnathan Scott ([id number]) -------------------------------------------------------------------------------- Encounter Discharge Information Details Patient Name: Johnathan Scott Date of Service: 11/01/2018 1:30 PM Medical Record Number: [id number] Patient Account Number: [id number] Date of Birth/Sex: 1966/10/07 (53 y.o. M) Treating RN: Huel Coventry Primary Care Keldan Eplin: Birdie Sons, ERIC Other Clinician: Referring Nykeem Citro: Birdie Sons, ERIC Treating Tyrell Brereton/Extender: Linwood Dibbles, HOYT Weeks in Treatment: 12 Encounter Discharge Information Items Post  Procedure Vitals Discharge Condition: Stable Temperature (F): 98.1 Ambulatory Status: Ambulatory Pulse (bpm): 51 Discharge Destination: Home Respiratory Rate (breaths/min): 16 Transportation: Private Auto Blood Pressure (mmHg): 155/73 Accompanied By: self Schedule Follow-up Appointment: Yes Clinical Summary of Care: Electronic Signature(s) Signed: 11/01/2018 3:49:31 PM By: Elliot Gurney, BSN, RN, CWS, Kim RN, BSN Entered By: Elliot Gurney, BSN, RN, CWS, Kim on 11/01/2018 14:15:57 Johnathan Scott ([id number]) -------------------------------------------------------------------------------- Lower Extremity Assessment Details Patient Name: Johnathan Scott Date of Service: 11/01/2018 1:30 PM Medical Record Number: [id number] Patient Account Number: [id number] Date of Birth/Sex: [birth year] (52 y.o. M) Treating RN: Rema Jasmine Primary Care Taquan Bralley: Birdie Sons, ERIC Other Clinician: Referring Ameliyah Sarno: Birdie Sons, ERIC Treating Ammon Muscatello/Extender: Linwood Dibbles, HOYT Weeks in Treatment: 12 Edema Assessment Assessed: [Left: No] [Right: No] Edema: [Left: Ye] [Right: s] Calf Left: Right: Point of Measurement: 29 cm From Medial Instep 53 cm cm Ankle Left: Right: Point of Measurement: 12 cm From Medial Instep 31 cm cm Vascular Assessment Pulses: Posterior Tibial Extremity colors, hair growth, and conditions: Extremity Color: [Left:Hyperpigmented] Hair Growth on Extremity: [Left:No] Temperature of Extremity: [Left:Warm] Capillary Refill: [Left:< 3 seconds] Toe Nail Assessment Left: Right: Thick: Yes Discolored: Yes Deformed: Yes Improper Length and Hygiene: Yes Electronic Signature(s) Signed: 11/03/2018 9:53:12 AM By: Rema Jasmine Entered By: Rema Jasmine on 11/01/2018 13:54:44 Johnathan Scott ([id number]) -------------------------------------------------------------------------------- Multi Wound Chart Details Patient Name: Johnathan Scott Date of Service: 11/01/2018 1:30 PM Medical Record Number:  [id number] Patient Account Number: [id number] Date of Birth/Sex: [birth year] (53 y.o. M) Treating RN: Arnette Norris Primary Care Jamille Fisher: Birdie Sons, ERIC Other Clinician: Referring Reighn Kaplan: Birdie Sons, ERIC Treating Lorianna Spadaccini/Extender: Linwood Dibbles, HOYT Weeks in Treatment: 12 Vital Signs Height(in): 61 Pulse(bpm): 51 Weight(lbs): 232 Blood Pressure(mmHg): 155/73 Body Mass Index(BMI): 44 Temperature(F): 98.1 Respiratory Rate 16 (breaths/min): Photos: [1:No Photos] [3:No Photos] [N/A:N/A] Wound Location: [1:Left Lower Leg - Anterior] [3:Left Lower Leg - Medial] [N/A:N/A] Wounding Event: [1:Gradually Appeared] [3:Gradually Appeared] [N/A:N/A] Primary Etiology: [1:Venous Leg Ulcer] [3:Venous Leg Ulcer] [N/A:N/A] Comorbid History: [1:Asthma, Osteoarthritis, Seizure Disorder] [3:Asthma, Osteoarthritis, Seizure Disorder] [N/A:N/A]  Date Acquired: [1:03/08/2018] [3:10/26/2018] [N/A:N/A] Weeks of Treatment: [1:12] [3:0] [N/A:N/A] Wound Status: [1:Open] [3:Open] [N/A:N/A] Measurements L x W x D [1:1.5x1.5x0.1] [3:0.1x0.1x0.1] [N/A:N/A] (cm) Area (cm) : [1:1.767] [3:0.008] [N/A:N/A] Volume (cm) : [1:0.177] [3:0.001] [N/A:N/A] % Reduction in Area: [1:82.10%] [3:98.60%] [N/A:N/A] % Reduction in Volume: [1:91.10%] [3:98.20%] [N/A:N/A] Classification: [1:Full Thickness Without Exposed Support Structures] [3:Partial Thickness] [N/A:N/A] Exudate Amount: [1:Medium] [3:Medium] [N/A:N/A] Exudate Type: [1:Serous] [3:Serosanguineous] [N/A:N/A] Exudate Color: [1:amber] [3:red, brown] [N/A:N/A] Wound Margin: [1:Flat and Intact] [3:Flat and Intact] [N/A:N/A] Granulation Amount: [1:Medium (34-66%)] [3:None Present (0%)] [N/A:N/A] Granulation Quality: [1:Red] [3:N/A] [N/A:N/A] Necrotic Amount: [1:Medium (34-66%)] [3:None Present (0%)] [N/A:N/A] Exposed Structures: [1:Fat Layer (Subcutaneous Tissue) Exposed: Yes Fascia: No Tendon: No Muscle: No Joint: No Bone: No] [3:Fat Layer (Subcutaneous Tissue)  Exposed: Yes Fascia: No Tendon: No Muscle: No Joint: No Bone: No] [N/A:N/A] Epithelialization: [1:Small (1-33%)] [3:Small (1-33%)] [N/A:N/A] Periwound Skin Texture: [1:Excoriation: No Induration: No Callus: No Crepitus: No] [3:Excoriation: No Induration: No Callus: No Crepitus: No] [N/A:N/A] Rash: No Rash: No Scarring: No Scarring: No Periwound Skin Moisture: Dry/Scaly: Yes Maceration: No N/A Maceration: No Dry/Scaly: No Periwound Skin Color: Hemosiderin Staining: Yes Atrophie Blanche: No N/A Atrophie Blanche: No Cyanosis: No Cyanosis: No Ecchymosis: No Ecchymosis: No Erythema: No Erythema: No Hemosiderin Staining: No Mottled: No Mottled: No Pallor: No Pallor: No Rubor: No Rubor: No Temperature: No Abnormality No Abnormality N/A Tenderness on Palpation: Yes No N/A Wound Preparation: Ulcer Cleansing: Other: soap Ulcer Cleansing: N/A and water Rinsed/Irrigated with Saline Topical Anesthetic Applied: Topical Anesthetic Applied: Other: lidocaine 4% Other: lidocaine 4% Treatment Notes Electronic Signature(s) Signed: 11/01/2018 3:50:02 PM By: Arnette NorrisBiell, Kristina Entered By: Arnette NorrisBiell, Kristina on 11/01/2018 14:00:36 Johnathan Scott, Johnathan Scott ([id number]) -------------------------------------------------------------------------------- Multi-Disciplinary Care Plan Details Patient Name: Johnathan Scott, Johnathan Scott Date of Service: 11/01/2018 1:30 PM Medical Record Number: [id number] Patient Account Number: [id number] Date of Birth/Sex: [birth year] (53 y.o. M) Treating RN: Arnette NorrisBiell, Kristina Primary Care Jhair Witherington: Birdie SonsSONNENBERG, ERIC Other Clinician: Referring Quoc Tome: Birdie SonsSONNENBERG, ERIC Treating Adalie Mand/Extender: Linwood DibblesSTONE III, HOYT Weeks in Treatment: 12 Active Inactive Necrotic Tissue Nursing Diagnoses: Knowledge deficit related to management of necrotic/devitalized tissue Goals: Necrotic/devitalized tissue will be minimized in the wound bed Date Initiated: 08/09/2018 Target Resolution Date:  09/09/2018 Goal Status: Active Interventions: Assess patient pain level pre-, during and post procedure and prior to discharge Treatment Activities: Apply topical anesthetic as ordered : 08/09/2018 Excisional debridement : 08/09/2018 Notes: Orientation to the Wound Care Program Nursing Diagnoses: Knowledge deficit related to the wound healing center program Goals: Patient/caregiver will verbalize understanding of the Wound Healing Center Program Date Initiated: 08/09/2018 Target Resolution Date: 09/09/2018 Goal Status: Active Interventions: Provide education on orientation to the wound center Notes: Wound/Skin Impairment Nursing Diagnoses: Impaired tissue integrity Knowledge deficit related to smoking impact on wound healing Goals: Ulcer/skin breakdown will have a volume reduction of 30% by week 4 Johnathan Scott, Johnathan Scott ([id number]) Date Initiated: 08/09/2018 Target Resolution Date: 09/09/2018 Goal Status: Active Interventions: Assess patient/caregiver ability to obtain necessary supplies Assess ulceration(s) every visit Treatment Activities: Topical wound management initiated : 08/09/2018 Notes: Electronic Signature(s) Signed: 11/01/2018 3:50:02 PM By: Arnette NorrisBiell, Kristina Entered By: Arnette NorrisBiell, Kristina on 11/01/2018 14:00:27 Johnathan Scott, Johnathan Scott ([id number]) -------------------------------------------------------------------------------- Pain Assessment Details Patient Name: Johnathan Scott, Darnell Date of Service: 11/01/2018 1:30 PM Medical Record Number: [id number] Patient Account Number: [id number] Date of Birth/Sex: [birth year] (53 y.o. M) Treating RN: Huel CoventryWoody, Kim Primary Care Reesha Debes: Birdie SonsSONNENBERG, ERIC Other Clinician: Referring Jocabed Cheese: Birdie SonsSONNENBERG, ERIC Treating Jagjit Riner/Extender: Linwood DibblesSTONE III, HOYT Weeks in Treatment: 12 Active Problems Location of Pain Severity and Description of Pain Patient  Has Paino No Site Locations Pain Management and Medication Current Pain Management: Electronic  Signature(s) Signed: 11/01/2018 3:03:36 PM By: Dayton MartesWallace, RCP,RRT,CHT, Sallie RCP, RRT, CHT Signed: 11/01/2018 3:49:31 PM By: Elliot GurneyWoody, BSN, RN, CWS, Kim RN, BSN Entered By: Dayton MartesWallace, RCP,RRT,CHT, Sallie on 11/01/2018 13:30:10 Johnathan Scott, Johnathan Scott ([id number]) -------------------------------------------------------------------------------- Patient/Caregiver Education Details Patient Name: Johnathan Scott, Johnathan Scott Date of Service: 11/01/2018 1:30 PM Medical Record Number: [id number] Patient Account Number: [id number] Date of Birth/Gender: [birth year] (53 y.o. M) Treating RN: Arnette NorrisBiell, Kristina Primary Care Physician: Birdie SonsSONNENBERG, ERIC Other Clinician: Referring Physician: Birdie SonsSONNENBERG, ERIC Treating Physician/Extender: Skeet SimmerSTONE III, HOYT Weeks in Treatment: 12 Education Assessment Education Provided To: Patient Education Topics Provided Wound/Skin Impairment: Handouts: Caring for Your Ulcer Methods: Demonstration, Explain/Verbal Responses: State content correctly Electronic Signature(s) Signed: 11/01/2018 3:50:02 PM By: Arnette NorrisBiell, Kristina Entered By: Arnette NorrisBiell, Kristina on 11/01/2018 14:03:36 Johnathan Scott, Johnathan Scott ([id number]) -------------------------------------------------------------------------------- Wound Assessment Details Patient Name: Johnathan Scott, Aurel Date of Service: 11/01/2018 1:30 PM Medical Record Number: [id number] Patient Account Number: [id number] Date of Birth/Sex: [birth year] (53 y.o. M) Treating RN: Rema JasmineNg, Wendi Primary Care Murray Guzzetta: Birdie SonsSONNENBERG, ERIC Other Clinician: Referring Rodell Marrs: Birdie SonsSONNENBERG, ERIC Treating Riah Kehoe/Extender: Linwood DibblesSTONE III, HOYT Weeks in Treatment: 12 Wound Status Wound Number: 1 Primary Etiology: Venous Leg Ulcer Wound Location: Left Lower Leg - Anterior Wound Status: Open Wounding Event: Gradually Appeared Comorbid History: Asthma, Osteoarthritis, Seizure Disorder Date Acquired: 03/08/2018 Weeks Of Treatment: 12 Clustered Wound: No Photos Photo Uploaded By: Rema JasmineNg, Wendi on 11/01/2018  14:28:48 Wound Measurements Length: (cm) 1.5 Width: (cm) 1.5 Depth: (cm) 0.1 Area: (cm) 1.767 Volume: (cm) 0.177 % Reduction in Area: 82.1% % Reduction in Volume: 91.1% Epithelialization: Small (1-33%) Tunneling: No Undermining: No Wound Description Full Thickness Without Exposed Support Foul Odo Classification: Structures Slough/F Wound Margin: Flat and Intact Exudate Medium Amount: Exudate Type: Serous Exudate Color: amber r After Cleansing: No ibrino Yes Wound Bed Granulation Amount: Medium (34-66%) Exposed Structure Granulation Quality: Red Fascia Exposed: No Necrotic Amount: Medium (34-66%) Fat Layer (Subcutaneous Tissue) Exposed: Yes Necrotic Quality: Adherent Slough Tendon Exposed: No Muscle Exposed: No Joint Exposed: No Bone Exposed: No Seppala, Tyler ([id number]) Periwound Skin Texture Texture Color No Abnormalities Noted: No No Abnormalities Noted: No Callus: No Atrophie Blanche: No Crepitus: No Cyanosis: No Excoriation: No Ecchymosis: No Induration: No Erythema: No Rash: No Hemosiderin Staining: Yes Scarring: No Mottled: No Pallor: No Moisture Rubor: No No Abnormalities Noted: No Dry / Scaly: Yes Temperature / Pain Maceration: No Temperature: No Abnormality Tenderness on Palpation: Yes Wound Preparation Ulcer Cleansing: Other: soap and water, Topical Anesthetic Applied: Other: lidocaine 4%, Treatment Notes Wound #1 (Left, Anterior Lower Leg) Notes silvercel, abd, 3 layer wrap with unna to anchor Electronic Signature(s) Signed: 11/03/2018 9:53:12 AM By: Rema JasmineNg, Wendi Entered By: Rema JasmineNg, Wendi on 11/01/2018 13:49:20 Johnathan Scott, Ulysee ([id number]) -------------------------------------------------------------------------------- Wound Assessment Details Patient Name: Johnathan Scott, Colonel Date of Service: 11/01/2018 1:30 PM Medical Record Number: [id number] Patient Account Number: [id number] Date of Birth/Sex: [birth year] (53 y.o. M) Treating RN:  Huel CoventryWoody, Kim Primary Care Keilyn Haggard: Birdie SonsSONNENBERG, ERIC Other Clinician: Referring Yishai Rehfeld: Birdie SonsSONNENBERG, ERIC Treating Kierah Goatley/Extender: Linwood DibblesSTONE III, HOYT Weeks in Treatment: 12 Wound Status Wound Number: 3 Primary Etiology: Venous Leg Ulcer Wound Location: Left, Medial Lower Leg Wound Status: Healed - Epithelialized Wounding Event: Gradually Appeared Comorbid History: Asthma, Osteoarthritis, Seizure Disorder Date Acquired: 10/26/2018 Weeks Of Treatment: 0 Clustered Wound: No Photos Photo Uploaded By: Rema JasmineNg, Wendi on 11/01/2018 14:28:12 Wound Measurements Length: (cm) 0 % Reduc Width: (cm) 0 % Reduc Depth: (cm) 0 Epithel Area: (cm) 0 Tunnel Volume: (cm) 0 Underm tion  in Area: 100% tion in Volume: 100% ialization: Small (1-33%) ing: No ining: No Wound Description Classification: Partial Thickness Foul Od Wound Margin: Flat and Intact Slough/ Exudate Amount: Medium Exudate Type: Serosanguineous Exudate Color: red, brown or After Cleansing: No Fibrino No Wound Bed Granulation Amount: None Present (0%) Exposed Structure Necrotic Amount: None Present (0%) Fascia Exposed: No Fat Layer (Subcutaneous Tissue) Exposed: Yes Tendon Exposed: No Muscle Exposed: No Joint Exposed: No Bone Exposed: No Periwound Skin Texture Laursen, Mychael ([id number]) Texture Color No Abnormalities Noted: No No Abnormalities Noted: No Callus: No Atrophie Blanche: No Crepitus: No Cyanosis: No Excoriation: No Ecchymosis: No Induration: No Erythema: No Rash: No Hemosiderin Staining: No Scarring: No Mottled: No Pallor: No Moisture Rubor: No No Abnormalities Noted: No Dry / Scaly: No Temperature / Pain Maceration: No Temperature: No Abnormality Wound Preparation Ulcer Cleansing: Rinsed/Irrigated with Saline Topical Anesthetic Applied: Other: lidocaine 4%, Electronic Signature(s) Signed: 11/01/2018 3:49:31 PM By: Elliot Gurney, BSN, RN, CWS, Kim RN, BSN Entered By: Elliot Gurney, BSN, RN, CWS, Kim on  11/01/2018 14:14:55 Johnathan Scott ([id number]) -------------------------------------------------------------------------------- Vitals Details Patient Name: Johnathan Scott Date of Service: 11/01/2018 1:30 PM Medical Record Number: [id number] Patient Account Number: [id number] Date of Birth/Sex: Nov 29, 1965 (52 y.o. M) Treating RN: Huel Coventry Primary Care Ikea Demicco: Birdie Sons, ERIC Other Clinician: Referring Kshawn Canal: Birdie Sons, ERIC Treating Ayo Smoak/Extender: Linwood Dibbles, HOYT Weeks in Treatment: 12 Vital Signs Time Taken: 13:30 Temperature (F): 98.1 Height (in): 61 Pulse (bpm): 51 Weight (lbs): 232 Respiratory Rate (breaths/min): 16 Body Mass Index (BMI): 43.8 Blood Pressure (mmHg): 155/73 Reference Range: 80 - 120 mg / dl Electronic Signature(s) Signed: 11/01/2018 3:03:36 PM By: Dayton Martes RCP, RRT, CHT Entered By: Dayton Martes on 11/01/2018 13:33:20

## 2018-11-08 ENCOUNTER — Encounter: Payer: 59 | Admitting: Physician Assistant

## 2018-11-08 DIAGNOSIS — L97822 Non-pressure chronic ulcer of other part of left lower leg with fat layer exposed: Secondary | ICD-10-CM | POA: Diagnosis not present

## 2018-11-09 NOTE — Progress Notes (Signed)
Johnathan Scott ([id number]) Visit Report for 11/08/2018 Arrival Information Details Patient Name: Johnathan Scott, Johnathan Scott Date of Service: 11/08/2018 12:45 PM Medical Record Number: [id number] Patient Account Number: [id number] Date of Birth/Sex: 08/08/1966 (53 y.o. M) Treating RN: Arnette Norris Primary Care Jerriyah Louis: Birdie Sons, ERIC Other Clinician: Referring Reeta Kuk: Birdie Sons, ERIC Treating Moira Umholtz/Extender: Linwood Dibbles, HOYT Weeks in Treatment: 13 Visit Information History Since Last Visit Added or deleted any medications: No Patient Arrived: Cane Any new allergies or adverse reactions: No Arrival Time: 12:39 Had a fall or experienced change in No Accompanied By: self activities of daily living that may affect Transfer Assistance: None risk of falls: Patient Identification Verified: Yes Signs or symptoms of abuse/neglect since last visito No Secondary Verification Process Yes Hospitalized since last visit: No Completed: Has Dressing in Place as Prescribed: Yes Patient Has Alerts: Yes Has Compression in Place as Prescribed: Yes Patient Alerts: PT REFUSED FACE Pain Present Now: No PHOTO Electronic Signature(s) Signed: 11/08/2018 5:13:49 PM By: Arnette Norris Entered By: Arnette Norris on 11/08/2018 12:40:11 Johnathan Scott ([id number]) -------------------------------------------------------------------------------- Clinic Level of Care Assessment Details Patient Name: Johnathan Scott Date of Service: 11/08/2018 12:45 PM Medical Record Number: [id number] Patient Account Number: [id number] Date of Birth/Sex: Jan 17, 1966 (53 y.o. M) Treating RN: Arnette Norris Primary Care Kiala Faraj: Birdie Sons, ERIC Other Clinician: Referring Catlynn Grondahl: Birdie Sons, ERIC Treating Dimarco Minkin/Extender: Linwood Dibbles, HOYT Weeks in Treatment: 13 Clinic Level of Care Assessment Items TOOL 4 Quantity Score []  - Use when only an EandM is performed on FOLLOW-UP visit 0 ASSESSMENTS - Nursing Assessment /  Reassessment X - Reassessment of Co-morbidities (includes updates in patient status) 1 10 X- 1 5 Reassessment of Adherence to Treatment Plan ASSESSMENTS - Wound and Skin Assessment / Reassessment X - Simple Wound Assessment / Reassessment - one wound 1 5 []  - 0 Complex Wound Assessment / Reassessment - multiple wounds []  - 0 Dermatologic / Skin Assessment (not related to wound area) ASSESSMENTS - Focused Assessment []  - Circumferential Edema Measurements - multi extremities 0 []  - 0 Nutritional Assessment / Counseling / Intervention []  - 0 Lower Extremity Assessment (monofilament, tuning fork, pulses) []  - 0 Peripheral Arterial Disease Assessment (using hand held doppler) ASSESSMENTS - Ostomy and/or Continence Assessment and Care []  - Incontinence Assessment and Management 0 []  - 0 Ostomy Care Assessment and Management (repouching, etc.) PROCESS - Coordination of Care X - Simple Patient / Family Education for ongoing care 1 15 []  - 0 Complex (extensive) Patient / Family Education for ongoing care X- 1 10 Staff obtains Chiropractor, Records, Test Results / Process Orders []  - 0 Staff telephones HHA, Nursing Homes / Clarify orders / etc []  - 0 Routine Transfer to another Facility (non-emergent condition) []  - 0 Routine Hospital Admission (non-emergent condition) []  - 0 New Admissions / Manufacturing engineer / Ordering NPWT, Apligraf, etc. []  - 0 Emergency Hospital Admission (emergent condition) X- 1 10 Simple Discharge Coordination JAYEN, BROMWELL ([id number]) []  - 0 Complex (extensive) Discharge Coordination PROCESS - Special Needs []  - Pediatric / Minor Patient Management 0 []  - 0 Isolation Patient Management []  - 0 Hearing / Language / Visual special needs []  - 0 Assessment of Community assistance (transportation, D/C planning, etc.) []  - 0 Additional assistance / Altered mentation []  - 0 Support Surface(s) Assessment (bed, cushion, seat, etc.) INTERVENTIONS -  Wound Cleansing / Measurement X - Simple Wound Cleansing - one wound 1 5 []  - 0 Complex Wound Cleansing - multiple wounds X- 1 5 Wound Imaging (photographs - any number of wounds) []  -  0 Wound Tracing (instead of photographs) X- 1 5 Simple Wound Measurement - one wound []  - 0 Complex Wound Measurement - multiple wounds INTERVENTIONS - Wound Dressings X - Small Wound Dressing one or multiple wounds 1 10 []  - 0 Medium Wound Dressing one or multiple wounds []  - 0 Large Wound Dressing one or multiple wounds []  - 0 Application of Medications - topical []  - 0 Application of Medications - injection INTERVENTIONS - Miscellaneous []  - External ear exam 0 []  - 0 Specimen Collection (cultures, biopsies, blood, body fluids, etc.) []  - 0 Specimen(s) / Culture(s) sent or taken to Lab for analysis []  - 0 Patient Transfer (multiple staff / Nurse, adultHoyer Lift / Similar devices) []  - 0 Simple Staple / Suture removal (25 or less) []  - 0 Complex Staple / Suture removal (26 or more) []  - 0 Hypo / Hyperglycemic Management (close monitor of Blood Glucose) []  - 0 Ankle / Brachial Index (ABI) - do not check if billed separately X- 1 5 Vital Signs Dolman, Ovidio ([id number]) Has the patient been seen at the hospital within the last three years: Yes Total Score: 85 Level Of Care: New/Established - Level 3 Electronic Signature(s) Signed: 11/08/2018 5:13:49 PM By: Arnette NorrisBiell, Kristina Entered By: Arnette NorrisBiell, Kristina on 11/08/2018 12:58:06 Johnathan SextonSHAMBLEY, Joshu ([id number]) -------------------------------------------------------------------------------- Encounter Discharge Information Details Patient Name: Johnathan SextonSHAMBLEY, Johnathan Date of Service: 11/08/2018 12:45 PM Medical Record Number: [id number] Patient Account Number: [id number] Date of Birth/Sex: [birth year] (53 y.o. M) Treating RN: Curtis Sitesorthy, Joanna Primary Care Jakaden Ouzts: Birdie SonsSONNENBERG, ERIC Other Clinician: Referring Freeland Pracht: Birdie SonsSONNENBERG, ERIC Treating Jaylei Fuerte/Extender:  Linwood DibblesSTONE III, HOYT Weeks in Treatment: 13 Encounter Discharge Information Items Discharge Condition: Stable Ambulatory Status: Cane Discharge Destination: Home Transportation: Private Auto Accompanied By: spouse Schedule Follow-up Appointment: Yes Clinical Summary of Care: Electronic Signature(s) Signed: 11/08/2018 3:54:31 PM By: Curtis Sitesorthy, Joanna Entered By: Curtis Sitesorthy, Joanna on 11/08/2018 15:54:31 Johnathan SextonSHAMBLEY, Lamarion ([id number]) -------------------------------------------------------------------------------- Lower Extremity Assessment Details Patient Name: Johnathan SextonSHAMBLEY, Zollie Date of Service: 11/08/2018 12:45 PM Medical Record Number: [id number] Patient Account Number: [id number] Date of Birth/Sex: [birth year] (53 y.o. M) Treating RN: Arnette NorrisBiell, Kristina Primary Care Velna Hedgecock: Birdie SonsSONNENBERG, ERIC Other Clinician: Referring Assad Harbeson: Birdie SonsSONNENBERG, ERIC Treating Kamber Vignola/Extender: Linwood DibblesSTONE III, HOYT Weeks in Treatment: 13 Edema Assessment Assessed: [Left: No] [Right: No] [Left: Edema] [Right: :] Calf Left: Right: Point of Measurement: 29 cm From Medial Instep 51 cm cm Ankle Left: Right: Point of Measurement: 12 cm From Medial Instep 29 cm cm Vascular Assessment Claudication: Claudication Assessment [Left:None] Pulses: Dorsalis Pedis Palpable: [Left:Yes] Posterior Tibial Extremity colors, hair growth, and conditions: Extremity Color: [Left:Hyperpigmented] Hair Growth on Extremity: [Left:No] Temperature of Extremity: [Left:Warm] Capillary Refill: [Left:< 3 seconds] Toe Nail Assessment Left: Right: Thick: Yes Discolored: Yes Deformed: Yes Improper Length and Hygiene: Yes Electronic Signature(s) Signed: 11/08/2018 5:13:49 PM By: Arnette NorrisBiell, Kristina Entered By: Arnette NorrisBiell, Kristina on 11/08/2018 12:46:57 Johnathan SextonSHAMBLEY, Saifan ([id number]) -------------------------------------------------------------------------------- Multi Wound Chart Details Patient Name: Johnathan SextonSHAMBLEY, Szymon Date of Service: 11/08/2018 12:45  PM Medical Record Number: [id number] Patient Account Number: [id number] Date of Birth/Sex: [birth year] (53 y.o. M) Treating RN: Arnette NorrisBiell, Kristina Primary Care Parmvir Boomer: Birdie SonsSONNENBERG, ERIC Other Clinician: Referring Kervens Roper: Birdie SonsSONNENBERG, ERIC Treating Hazel Wrinkle/Extender: Linwood DibblesSTONE III, HOYT Weeks in Treatment: 13 Vital Signs Height(in): 61 Pulse(bpm): 56 Weight(lbs): 232 Blood Pressure(mmHg): 170/81 Body Mass Index(BMI): 44 Temperature(F): 97.8 Respiratory Rate 16 (breaths/min): Photos: [1:No Photos] [N/A:N/A] Wound Location: [1:Left Lower Leg - Anterior] [N/A:N/A] Wounding Event: [1:Gradually Appeared] [N/A:N/A] Primary Etiology: [1:Venous Leg Ulcer] [N/A:N/A] Comorbid History: [1:Asthma, Osteoarthritis, Seizure Disorder] [N/A:N/A] Date Acquired: [1:03/08/2018] [N/A:N/A] Weeks of Treatment: [1:13] [  N/A:N/A] Wound Status: [1:Open] [N/A:N/A] Measurements L x W x D [1:1.5x1.5x0.1] [N/A:N/A] (cm) Area (cm) : [1:1.767] [N/A:N/A] Volume (cm) : [1:0.177] [N/A:N/A] % Reduction in Area: [1:82.10%] [N/A:N/A] % Reduction in Volume: [1:91.10%] [N/A:N/A] Classification: [1:Full Thickness Without Exposed Support Structures] [N/A:N/A] Exudate Amount: [1:Medium] [N/A:N/A] Exudate Type: [1:Serous] [N/A:N/A] Exudate Color: [1:amber] [N/A:N/A] Wound Margin: [1:Flat and Intact] [N/A:N/A] Granulation Amount: [1:Medium (34-66%)] [N/A:N/A] Granulation Quality: [1:Red] [N/A:N/A] Necrotic Amount: [1:Medium (34-66%)] [N/A:N/A] Exposed Structures: [1:Fat Layer (Subcutaneous Tissue) Exposed: Yes Fascia: No Tendon: No Muscle: No Joint: No Bone: No] [N/A:N/A] Epithelialization: [1:Small (1-33%)] [N/A:N/A] Periwound Skin Texture: [1:Excoriation: No Induration: No Callus: No Crepitus: No] [N/A:N/A] Rash: No Scarring: No Periwound Skin Moisture: Dry/Scaly: Yes N/A N/A Maceration: No Periwound Skin Color: Hemosiderin Staining: Yes N/A N/A Atrophie Blanche: No Cyanosis: No Ecchymosis: No Erythema:  No Mottled: No Pallor: No Rubor: No Temperature: No Abnormality N/A N/A Tenderness on Palpation: Yes N/A N/A Wound Preparation: Ulcer Cleansing: Other: soap N/A N/A and water Topical Anesthetic Applied: Other: lidocaine 4% Treatment Notes Electronic Signature(s) Signed: 11/08/2018 5:13:49 PM By: Arnette Norris Entered By: Arnette Norris on 11/08/2018 12:55:15 Johnathan Scott ([id number]) -------------------------------------------------------------------------------- Multi-Disciplinary Care Plan Details Patient Name: Johnathan Scott Date of Service: 11/08/2018 12:45 PM Medical Record Number: [id number] Patient Account Number: [id number] Date of Birth/Sex: April 15, 1966 (53 y.o. M) Treating RN: Arnette Norris Primary Care Cecile Guevara: Birdie Sons, ERIC Other Clinician: Referring Avery Eustice: Birdie Sons, ERIC Treating Toris Laverdiere/Extender: Linwood Dibbles, HOYT Weeks in Treatment: 13 Active Inactive Necrotic Tissue Nursing Diagnoses: Knowledge deficit related to management of necrotic/devitalized tissue Goals: Necrotic/devitalized tissue will be minimized in the wound bed Date Initiated: 08/09/2018 Target Resolution Date: 09/09/2018 Goal Status: Active Interventions: Assess patient pain level pre-, during and post procedure and prior to discharge Treatment Activities: Apply topical anesthetic as ordered : 08/09/2018 Excisional debridement : 08/09/2018 Notes: Orientation to the Wound Care Program Nursing Diagnoses: Knowledge deficit related to the wound healing center program Goals: Patient/caregiver will verbalize understanding of the Wound Healing Center Program Date Initiated: 08/09/2018 Target Resolution Date: 09/09/2018 Goal Status: Active Interventions: Provide education on orientation to the wound center Notes: Wound/Skin Impairment Nursing Diagnoses: Impaired tissue integrity Knowledge deficit related to smoking impact on wound healing Goals: Ulcer/skin breakdown will have  a volume reduction of 30% by week 4 MASTER, MCKEE ([id number]) Date Initiated: 08/09/2018 Target Resolution Date: 09/09/2018 Goal Status: Active Interventions: Assess patient/caregiver ability to obtain necessary supplies Assess ulceration(s) every visit Treatment Activities: Topical wound management initiated : 08/09/2018 Notes: Electronic Signature(s) Signed: 11/08/2018 5:13:49 PM By: Arnette Norris Entered By: Arnette Norris on 11/08/2018 12:55:08 Johnathan Scott ([id number]) -------------------------------------------------------------------------------- Pain Assessment Details Patient Name: Johnathan Scott Date of Service: 11/08/2018 12:45 PM Medical Record Number: [id number] Patient Account Number: [id number] Date of Birth/Sex: 1965-12-27 (53 y.o. M) Treating RN: Arnette Norris Primary Care Earlisha Sharples: Birdie Sons, ERIC Other Clinician: Referring Albina Gosney: Birdie Sons, ERIC Treating Jiovani Mccammon/Extender: Linwood Dibbles, HOYT Weeks in Treatment: 13 Active Problems Location of Pain Severity and Description of Pain Patient Has Paino No Site Locations Pain Management and Medication Current Pain Management: Electronic Signature(s) Signed: 11/08/2018 5:13:49 PM By: Arnette Norris Entered By: Arnette Norris on 11/08/2018 12:40:17 Johnathan Scott ([id number]) -------------------------------------------------------------------------------- Patient/Caregiver Education Details Patient Name: Johnathan Scott Date of Service: 11/08/2018 12:45 PM Medical Record Number: [id number] Patient Account Number: [id number] Date of Birth/Gender: 1966-03-05 (53 y.o. M) Treating RN: Arnette Norris Primary Care Physician: Birdie Sons, ERIC Other Clinician: Referring Physician: Birdie Sons, ERIC Treating Physician/Extender: Skeet Simmer in Treatment: 13 Education Assessment Education Provided To: Patient Education Topics Provided  Wound/Skin Impairment: Handouts: Caring for Your  Ulcer Methods: Demonstration, Explain/Verbal Responses: State content correctly Electronic Signature(s) Signed: 11/08/2018 5:13:49 PM By: Arnette NorrisBiell, Kristina Entered By: Arnette NorrisBiell, Kristina on 11/08/2018 12:55:47 Johnathan SextonSHAMBLEY, Keinan ([id number]) -------------------------------------------------------------------------------- Wound Assessment Details Patient Name: Johnathan SextonSHAMBLEY, Nikitas Date of Service: 11/08/2018 12:45 PM Medical Record Number: [id number] Patient Account Number: [id number] Date of Birth/Sex: 10/13/1966 (53 y.o. M) Treating RN: Arnette NorrisBiell, Kristina Primary Care Kashtyn Jankowski: Birdie SonsSONNENBERG, ERIC Other Clinician: Referring Nefertari Rebman: Birdie SonsSONNENBERG, ERIC Treating Novalie Leamy/Extender: Linwood DibblesSTONE III, HOYT Weeks in Treatment: 13 Wound Status Wound Number: 1 Primary Etiology: Venous Leg Ulcer Wound Location: Left Lower Leg - Anterior Wound Status: Open Wounding Event: Gradually Appeared Comorbid History: Asthma, Osteoarthritis, Seizure Disorder Date Acquired: 03/08/2018 Weeks Of Treatment: 13 Clustered Wound: No Photos Photo Uploaded By: Rema JasmineNg, Wendi on 11/08/2018 13:20:41 Wound Measurements Length: (cm) 1.5 Width: (cm) 1.5 Depth: (cm) 0.1 Area: (cm) 1.767 Volume: (cm) 0.177 % Reduction in Area: 82.1% % Reduction in Volume: 91.1% Epithelialization: Small (1-33%) Tunneling: No Wound Description Full Thickness Without Exposed Support Foul Odo Classification: Structures Slough/F Wound Margin: Flat and Intact Exudate Medium Amount: Exudate Type: Serous Exudate Color: amber r After Cleansing: No ibrino Yes Wound Bed Granulation Amount: Medium (34-66%) Exposed Structure Granulation Quality: Red Fascia Exposed: No Necrotic Amount: Medium (34-66%) Fat Layer (Subcutaneous Tissue) Exposed: Yes Necrotic Quality: Adherent Slough Tendon Exposed: No Muscle Exposed: No Joint Exposed: No Bone Exposed: No Kuiken, Numair ([id number]) Periwound Skin Texture Texture Color No Abnormalities Noted: No No  Abnormalities Noted: No Callus: No Atrophie Blanche: No Crepitus: No Cyanosis: No Excoriation: No Ecchymosis: No Induration: No Erythema: No Rash: No Hemosiderin Staining: Yes Scarring: No Mottled: No Pallor: No Moisture Rubor: No No Abnormalities Noted: No Dry / Scaly: Yes Temperature / Pain Maceration: No Temperature: No Abnormality Tenderness on Palpation: Yes Wound Preparation Ulcer Cleansing: Other: soap and water, Topical Anesthetic Applied: Other: lidocaine 4%, Treatment Notes Wound #1 (Left, Anterior Lower Leg) Notes silvercel, abd, 3 layer wrap with unna to anchor Electronic Signature(s) Signed: 11/08/2018 5:13:49 PM By: Arnette NorrisBiell, Kristina Entered By: Arnette NorrisBiell, Kristina on 11/08/2018 12:45:49 Johnathan SextonSHAMBLEY, Eustace ([id number]) -------------------------------------------------------------------------------- Vitals Details Patient Name: Johnathan SextonSHAMBLEY, Deaaron Date of Service: 11/08/2018 12:45 PM Medical Record Number: [id number] Patient Account Number: [id number] Date of Birth/Sex: 10/13/1966 (53 y.o. M) Treating RN: Arnette NorrisBiell, Kristina Primary Care Narcissus Detwiler: Birdie SonsSONNENBERG, ERIC Other Clinician: Referring Maresa Morash: Birdie SonsSONNENBERG, ERIC Treating Idrees Quam/Extender: Linwood DibblesSTONE III, HOYT Weeks in Treatment: 13 Vital Signs Time Taken: 12:45 Temperature (F): 97.8 Height (in): 61 Pulse (bpm): 56 Weight (lbs): 232 Respiratory Rate (breaths/min): 16 Body Mass Index (BMI): 43.8 Blood Pressure (mmHg): 170/81 Reference Range: 80 - 120 mg / dl Electronic Signature(s) Signed: 11/08/2018 5:13:49 PM By: Arnette NorrisBiell, Kristina Entered By: Arnette NorrisBiell, Kristina on 11/08/2018 12:48:19

## 2018-11-09 NOTE — Progress Notes (Signed)
TAAVI, NIEMELA ([id number]) Visit Report for 11/08/2018 Chief Complaint Document Details Patient Name: Johnathan Scott, Johnathan Scott Date of Service: 11/08/2018 12:45 PM Medical Record Number: [id number] Patient Account Number: [id number] Date of Birth/Sex: [birth year] (53 y.o. M) Treating RN: Arnette Norris Primary Care Provider: Birdie Sons, ERIC Other Clinician: Referring Provider: Birdie Sons, ERIC Treating Provider/Extender: Linwood Dibbles, HOYT Weeks in Treatment: 13 Information Obtained from: Patient Chief Complaint Left Anterior LE Ulcer Electronic Signature(s) Signed: 11/08/2018 9:58:15 PM By: Lenda Kelp PA-C Entered By: Lenda Kelp on 11/08/2018 12:54:27 Johnathan Scott ([id number]) -------------------------------------------------------------------------------- HPI Details Patient Name: Johnathan Scott Date of Service: 11/08/2018 12:45 PM Medical Record Number: [id number] Patient Account Number: [id number] Date of Birth/Sex: 1966-05-19 (53 y.o. M) Treating RN: Arnette Norris Primary Care Provider: Birdie Sons, ERIC Other Clinician: Referring Provider: Birdie Sons, ERIC Treating Provider/Extender: Linwood Dibbles, HOYT Weeks in Treatment: 13 History of Present Illness HPI Description: 08/09/18 on evaluation today patient presents for initial evaluation and clinic as referral concerning an ulcer that he has on the left anterior lower Trinity which has been present for five months. He states that he actually noticed this after having an "insect bite" while driving down the road. He states that he did not see what kind of insect it was but he felt it wrong up his leg and then have the bite. Since that time is been having issues with the fact that this area does not want to heal. He does have a history of asthma as well as anemia is most recent CBC revealed a hemoglobin of 11.1 with a hematocrit of 34.6. He does have a history of panic attacks as well intermittently. No fevers, chills, nausea, or  vomiting noted at this time. Specifically the patient does not appear to have any infection in regard to the left lower extremity. He's not on the current antibiotics. 08/16/18 upon evaluation today patient actually appears to be doing significantly better in regard to his wound. He has been tolerating the dressing changes that he was performing on his own over the last week. Subsequently we did not wrap his leg at that time although we discussed it. We weren't sure that he was gonna be able to get his pants on top of the dressing. Nonetheless the patient fortunately came today with shorts on he states that he is willing to be wrapped if it will help the wound to heal faster. He is pleased with the fact that the wound already appears to be doing better it's also not hurting as badly. The last debridement was a little bit more uncomfortable for him unfortunately. Nonetheless he states he's willing to allow me to debride it again today if I do so carefully. 08/24/18 upon evaluation today patient actually appears to be doing rather well in regard to his lower Trinity ulcer on the left. He has been tolerating the dressing changes with Iodoflex including the wrap without complication and the wound [place] into making signs of good progress. Overall I'm very pleased with how things appear at this time. No fevers, chills, nausea, or vomiting noted at this time. 09/03/18 on evaluation today patient actually appears to be doing rather well in regard to his wound. In fact the overall measurement belies the fact that the wound is actually healed quite a bit more than what the measurement would suggest. In fact it's almost half the size that it was during the last evaluation unfortunately a lot of the area where new skin has grown over is in between two areas that had to  be clustered together one of the distal portion of the wound has not completely healed up. Nonetheless there is actually quite a bit more healing  than what seems to be represented by the wound measurement today. Overall I'm very pleased with the overall progress and how he seems to be doing with the dressing changes. The wrap seems to be of great benefit for him as well. 09/09/18 upon evaluation today patient actually appears to be doing excellent in regard to his left lotion the ulcer. He has been tolerating the dressing changes without complication. There does not appear to be any evidence of infection overall I think the [facility] Dressing has done excellent for him. I'm very pleased. 09/23/18 evaluation today patient actually appears to be doing poorly in regard to his lower extremity ulcer. There's a lot of maceration and skin breakdown around the actual wound itself which is making it difficult to even tell exactly what is going on. This is something he states started about four days ago. Nonetheless I'm concerned about the possibility of infection just being that he was not having near this amount of drainage previous. He also states in the past 24 hours the pain has been a little bit more significant. No fevers, chills, nausea, or vomiting noted at this time. 09/30/18 on evaluation today patient actually appears to be doing poorly in regard to his lower extremity ulcer. He's been tolerating the dressing changes although there is a lot of tape irritation surrounding the wound area. Subsequently I do think that he would benefit from us initiating treatment with all the compression wrap as we did before to avoid any additional injury to the area. No fevers, chills, nausea, or vomiting noted at this time. The patient did have a return of his wound culture which showed Pseudomonas which will not be covered by the Bactrim that I previously prescribed for him. With that being said the patient states that he did not know I was sitting in a prescription for him and therefore never picked this up he also states the pharmacy did not call  him. Nonetheless this was something that was sent we will call the pharmacy and having counseling as Johnathan SextonSHAMBLEY, Johnathan ([id number]) I'm gonna have to send them something different. 10/08/18 on evaluation today patient's ulcer still appears to be much larger than what it was previous to the deterioration. With that being said the erythema surrounding the wound bed is not nearly as significant I do believe the antibiotic has been of help for him. He did finally get the Levaquin which is good news. No fevers, chills, nausea, or vomiting noted at this time. 10/12/18 on evaluation today patient actually appears to be doing much better in regard to his lower extremity ulcer. Overall I do believe the dressing change has done well for him at this time. I'm pleased with the overall progress even since just in the last week. 10/19/18 Seen today for follow up and management of left lower leg wound. Wound looks to be improving today and measuring smaller. Decreased surrounding erythema. No s/s of infection. Denies any increased pain, fever, or chills. 10/26/18 on evaluation today patient's wound actually appears to be doing very well. Unfortunately he has a new wound on the medial aspect of his lower extremity that is due to the wrap having slid down. He did not contact us as he did not know whether or not we be able to work him in. Nonetheless I told him in the future if  this happens the definitely call us and let us know. 11/01/18 on evaluation today patient's lower Trinity ulcer appears to show signs of improvement compared to last week's evaluation. In fact the new area which blistered and arose last week appears to likely be completely healed at this time. Fortunately there is no sign of infection which is also good news. Overall I'm very pleased with how things seem to be progressing. 11/08/18 on evaluation today patient appears to be doing better in regard to his lower extremity ulcer. Fortunately there is  no evidence of infection at this time. Overall he seems to be making good progress which is good news. Electronic Signature(s) Signed: 11/08/2018 9:58:15 PM By: Lenda KelpStone III, Hoyt PA-C Entered By: Lenda KelpStone III, Hoyt on 11/08/2018 13:59:26 Johnathan SextonSHAMBLEY, Johnathan ([id number]) -------------------------------------------------------------------------------- Physical Exam Details Patient Name: Johnathan SextonSHAMBLEY, Johnathan Date of Service: 11/08/2018 12:45 PM Medical Record Number: [id number] Patient Account Number: [id number] Date of Birth/Sex: [birth year] (53 y.o. M) Treating RN: Arnette NorrisBiell, Kristina Primary Care Provider: Birdie SonsSONNENBERG, ERIC Other Clinician: Referring Provider: Birdie SonsSONNENBERG, ERIC Treating Provider/Extender: STONE III, HOYT Weeks in Treatment: 13 Constitutional Well-nourished and well-hydrated in no acute distress. Respiratory normal breathing without difficulty. clear to auscultation bilaterally. Cardiovascular regular rate and rhythm with normal S1, S2. 1+ pitting edema of the bilateral lower extremities. Psychiatric this patient is able to make decisions and demonstrates good insight into disease process. Alert and Oriented x 3. pleasant and cooperative. Notes Patient's wound bed currently shows good granulation there was minimal Slough noted on the surface of the wound which was gently debated away with saline and gauze without complication no sharp debridement required today. Electronic Signature(s) Signed: 11/08/2018 9:58:15 PM By: Lenda KelpStone III, Hoyt PA-C Entered By: Lenda KelpStone III, Hoyt on 11/08/2018 13:59:57 Johnathan SextonSHAMBLEY, Johnathan ([id number]) -------------------------------------------------------------------------------- Physician Orders Details Patient Name: Johnathan SextonSHAMBLEY, Johnathan Date of Service: 11/08/2018 12:45 PM Medical Record Number: [id number] Patient Account Number: [id number] Date of Birth/Sex: [birth year] (53 y.o. M) Treating RN: Arnette NorrisBiell, Kristina Primary Care Provider: Birdie SonsSONNENBERG, ERIC Other  Clinician: Referring Provider: Birdie SonsSONNENBERG, ERIC Treating Provider/Extender: Linwood DibblesSTONE III, HOYT Weeks in Treatment: 13 Verbal / Phone Orders: No Diagnosis Coding ICD-10 Coding Code Description I87.2 Venous insufficiency (chronic) (peripheral) L97.822 Non-pressure chronic ulcer of other part of left lower leg with fat layer exposed D50.9 Iron deficiency anemia, unspecified J45.998 Other asthma Wound Cleansing Wound #1 Left,Anterior Lower Leg o Clean wound with Normal Saline. o May Shower, gently pat wound dry prior to applying new dressing. Anesthetic (add to Medication List) Wound #1 Left,Anterior Lower Leg o Topical Lidocaine 4% cream applied to wound bed prior to debridement (In Clinic Only). Primary Wound Dressing Wound #1 Left,Anterior Lower Leg o Silver Alginate Secondary Dressing Wound #1 Left,Anterior Lower Leg o ABD pad Dressing Change Frequency Wound #1 Left,Anterior Lower Leg o Change dressing every week Follow-up Appointments Wound #1 Left,Anterior Lower Leg o Return Appointment in 1 week. Edema Control Wound #1 Left,Anterior Lower Leg o 3 Layer Compression System - Left Lower Extremity o Elevate legs to the level of the heart and pump ankles as often as possible Off-Loading Wound #1 Left,Anterior Lower Leg o Turn and reposition every 2 hours Johnathan SextonSHAMBLEY, Johnathan ([id number]) Electronic Signature(s) Signed: 11/08/2018 5:13:49 PM By: Arnette NorrisBiell, Kristina Signed: 11/08/2018 9:58:15 PM By: Lenda KelpStone III, Hoyt PA-C Entered By: Arnette NorrisBiell, Kristina on 11/08/2018 12:58:48 Johnathan SextonSHAMBLEY, Johnathan ([id number]) -------------------------------------------------------------------------------- Problem List Details Patient Name: Johnathan SextonSHAMBLEY, Johnathan Date of Service: 11/08/2018 12:45 PM Medical Record Number: [id number] Patient Account Number: [id number] Date of Birth/Sex: [birth year] (53 y.o. M) Treating RN: Arnette NorrisBiell, Kristina  Primary Care Provider: Birdie Sons, ERIC Other  Clinician: Referring Provider: Birdie Sons, ERIC Treating Provider/Extender: Linwood Dibbles, HOYT Weeks in Treatment: 13 Active Problems ICD-10 Evaluated Encounter Code Description Active Date Today Diagnosis I87.2 Venous insufficiency (chronic) (peripheral) 08/09/2018 No Yes L97.822 Non-pressure chronic ulcer of other part of left lower leg with 08/09/2018 No Yes fat layer exposed D50.9 Iron deficiency anemia, unspecified 08/09/2018 No Yes J45.998 Other asthma 08/09/2018 No Yes Inactive Problems Resolved Problems Electronic Signature(s) Signed: 11/08/2018 9:58:15 PM By: Lenda Kelp PA-C Entered By: Lenda Kelp on 11/08/2018 12:54:20 Johnathan Scott ([id number]) -------------------------------------------------------------------------------- Progress Note Details Patient Name: Johnathan Scott Date of Service: 11/08/2018 12:45 PM Medical Record Number: [id number] Patient Account Number: [id number] Date of Birth/Sex: [birth year] (53 y.o. M) Treating RN: Arnette Norris Primary Care Provider: Birdie Sons, ERIC Other Clinician: Referring Provider: Birdie Sons, ERIC Treating Provider/Extender: Linwood Dibbles, HOYT Weeks in Treatment: 13 Subjective Chief Complaint Information obtained from Patient Left Anterior LE Ulcer History of Present Illness (HPI) 08/09/18 on evaluation today patient presents for initial evaluation and clinic as referral concerning an ulcer that he has on the left anterior lower Trinity which has been present for five months. He states that he actually noticed this after having an "insect bite" while driving down the road. He states that he did not see what kind of insect it was but he felt it wrong up his leg and then have the bite. Since that time is been having issues with the fact that this area does not want to heal. He does have a history of asthma as well as anemia is most recent CBC revealed a hemoglobin of 11.1 with a hematocrit of 34.6. He does have a history  of panic attacks as well intermittently. No fevers, chills, nausea, or vomiting noted at this time. Specifically the patient does not appear to have any infection in regard to the left lower extremity. He's not on the current antibiotics. 08/16/18 upon evaluation today patient actually appears to be doing significantly better in regard to his wound. He has been tolerating the dressing changes that he was performing on his own over the last week. Subsequently we did not wrap his leg at that time although we discussed it. We weren't sure that he was gonna be able to get his pants on top of the dressing. Nonetheless the patient fortunately came today with shorts on he states that he is willing to be wrapped if it will help the wound to heal faster. He is pleased with the fact that the wound already appears to be doing better it's also not hurting as badly. The last debridement was a little bit more uncomfortable for him unfortunately. Nonetheless he states he's willing to allow me to debride it again today if I do so carefully. 08/24/18 upon evaluation today patient actually appears to be doing rather well in regard to his lower Trinity ulcer on the left. He has been tolerating the dressing changes with Iodoflex including the wrap without complication and the wound [place] into making signs of good progress. Overall I'm very pleased with how things appear at this time. No fevers, chills, nausea, or vomiting noted at this time. 09/03/18 on evaluation today patient actually appears to be doing rather well in regard to his wound. In fact the overall measurement belies the fact that the wound is actually healed quite a bit more than what the measurement would suggest. In fact it's almost half the size that it was during the last evaluation unfortunately a lot  of the area where new skin has grown over is in between two areas that had to be clustered together one of the distal portion of the wound has not  completely healed up. Nonetheless there is actually quite a bit more healing than what seems to be represented by the wound measurement today. Overall I'm very pleased with the overall progress and how he seems to be doing with the dressing changes. The wrap seems to be of great benefit for him as well. 09/09/18 upon evaluation today patient actually appears to be doing excellent in regard to his left lotion the ulcer. He has been tolerating the dressing changes without complication. There does not appear to be any evidence of infection overall I think the [facility] Dressing has done excellent for him. I'm very pleased. 09/23/18 evaluation today patient actually appears to be doing poorly in regard to his lower extremity ulcer. There's a lot of maceration and skin breakdown around the actual wound itself which is making it difficult to even tell exactly what is going on. This is something he states started about four days ago. Nonetheless I'm concerned about the possibility of infection just being that he was not having near this amount of drainage previous. He also states in the past 24 hours the pain has been a little bit more significant. No fevers, chills, nausea, or vomiting noted at this time. 09/30/18 on evaluation today patient actually appears to be doing poorly in regard to his lower extremity ulcer. He's been tolerating the dressing changes although there is a lot of tape irritation surrounding the wound area. Subsequently I do think [facility], Jerryl ([id number]) that he would benefit from [place] initiating treatment with all the compression wrap as we did before to avoid any additional injury to the area. No fevers, chills, nausea, or vomiting noted at this time. The patient did have a return of his wound culture which showed Pseudomonas which will not be covered by the Bactrim that I previously prescribed for him. With that being said the patient states that he did not know I was  sitting in a prescription for him and therefore never picked this up he also states the pharmacy did not call him. Nonetheless this was something that was sent we will call the pharmacy and having counseling as I'm gonna have to send them something different. 10/08/18 on evaluation today patient's ulcer still appears to be much larger than what it was previous to the deterioration. With that being said the erythema surrounding the wound bed is not nearly as significant I do believe the antibiotic has been of help for him. He did finally get the Levaquin which is good news. No fevers, chills, nausea, or vomiting noted at this time. 10/12/18 on evaluation today patient actually appears to be doing much better in regard to his lower extremity ulcer. Overall I do believe the dressing change has done well for him at this time. I'm pleased with the overall progress even since just in the last week. 10/19/18 Seen today for follow up and management of left lower leg wound. Wound looks to be improving today and measuring smaller. Decreased surrounding erythema. No s/s of infection. Denies any increased pain, fever, or chills. 10/26/18 on evaluation today patient's wound actually appears to be doing very well. Unfortunately he has a new wound on the medial aspect of his lower extremity that is due to the wrap having slid down. He did not contact us as he did not know whether  or not we be able to work him in. Nonetheless I told him in the future if this happens the definitely call us and let us know. 11/01/18 on evaluation today patient's lower Trinity ulcer appears to show signs of improvement compared to last week's evaluation. In fact the new area which blistered and arose last week appears to likely be completely healed at this time. Fortunately there is no sign of infection which is also good news. Overall I'm very pleased with how things seem to be progressing. 11/08/18 on evaluation today patient appears  to be doing better in regard to his lower extremity ulcer. Fortunately there is no evidence of infection at this time. Overall he seems to be making good progress which is good news. Patient History Information obtained from Patient. Family History Cancer - Mother,Father,Siblings, Diabetes - Siblings, Heart Disease - Siblings, Hypertension - Siblings, No family history of Hereditary Spherocytosis, Kidney Disease, Lung Disease, Seizures, Stroke, Thyroid Problems, Tuberculosis. Social History Former smoker - 7 years, Marital Status - Single, Alcohol Use - Rarely, Drug Use - No History, Caffeine Use - Never. Review of Systems (ROS) Constitutional Symptoms (General Health) Denies complaints or symptoms of Fever, Chills. Respiratory The patient has no complaints or symptoms. Cardiovascular Complains or has symptoms of LE edema. Psychiatric The patient has no complaints or symptoms. Johnathan SextonSHAMBLEY, Johnathan ([id number]) Objective Constitutional Well-nourished and well-hydrated in no acute distress. Vitals Time Taken: 12:45 PM, Height: 61 in, Weight: 232 lbs, BMI: 43.8, Temperature: 97.8 F, Pulse: 56 bpm, Respiratory Rate: 16 breaths/min, Blood Pressure: 170/81 mmHg. Respiratory normal breathing without difficulty. clear to auscultation bilaterally. Cardiovascular regular rate and rhythm with normal S1, S2. 1+ pitting edema of the bilateral lower extremities. Psychiatric this patient is able to make decisions and demonstrates good insight into disease process. Alert and Oriented x 3. pleasant and cooperative. General Notes: Patient's wound bed currently shows good granulation there was minimal Slough noted on the surface of the wound which was gently debated away with saline and gauze without complication no sharp debridement required today. Integumentary (Hair, Skin) Wound #1 status is Open. Original cause of wound was Gradually Appeared. The wound is located on the Left,Anterior Lower Leg.  The wound measures 1.5cm length x 1.5cm width x 0.1cm depth; 1.767cm^2 area and 0.177cm^3 volume. There is Fat Layer (Subcutaneous Tissue) Exposed exposed. There is no tunneling noted. There is a medium amount of serous drainage noted. The wound margin is flat and intact. There is medium (34-66%) red granulation within the wound bed. There is a medium (34-66%) amount of necrotic tissue within the wound bed including Adherent Slough. The periwound skin appearance exhibited: Dry/Scaly, Hemosiderin Staining. The periwound skin appearance did not exhibit: Callus, Crepitus, Excoriation, Induration, Rash, Scarring, Maceration, Atrophie Blanche, Cyanosis, Ecchymosis, Mottled, Pallor, Rubor, Erythema. Periwound temperature was noted as No Abnormality. The periwound has tenderness on palpation. Assessment Active Problems ICD-10 Venous insufficiency (chronic) (peripheral) Non-pressure chronic ulcer of other part of left lower leg with fat layer exposed Iron deficiency anemia, unspecified Other asthma Plan Wound Cleansing: Wound #1 Left,Anterior Lower Leg: Clean wound with Normal Saline. May Shower, gently pat wound dry prior to applying new dressing. Johnathan SextonSHAMBLEY, Johnathan ([id number]) Anesthetic (add to Medication List): Wound #1 Left,Anterior Lower Leg: Topical Lidocaine 4% cream applied to wound bed prior to debridement (In Clinic Only). Primary Wound Dressing: Wound #1 Left,Anterior Lower Leg: Silver Alginate Secondary Dressing: Wound #1 Left,Anterior Lower Leg: ABD pad Dressing Change Frequency: Wound #1 Left,Anterior Lower Leg: Change dressing every  week Follow-up Appointments: Wound #1 Left,Anterior Lower Leg: Return Appointment in 1 week. Edema Control: Wound #1 Left,Anterior Lower Leg: 3 Layer Compression System - Left Lower Extremity Elevate legs to the level of the heart and pump ankles as often as possible Off-Loading: Wound #1 Left,Anterior Lower Leg: Turn and reposition every 2  hours I'm gonna recommend at this point that we go ahead and continue with the above wintry measures for the next week. The patient is in agreement the plan. We will see were things stand at follow-up. If anything changes or worsens he will let us know. Please see above for specific wound care orders. We will see patient for re-evaluation in 1 week(s) here in the clinic. If anything worsens or changes patient will contact our office for additional recommendations. Electronic Signature(s) Signed: 11/08/2018 9:58:15 PM By: Lenda Kelp PA-C Entered By: Lenda Kelp on 11/08/2018 14:00:11 Johnathan Scott ([id number]) -------------------------------------------------------------------------------- ROS/PFSH Details Patient Name: Johnathan Scott Date of Service: 11/08/2018 12:45 PM Medical Record Number: [id number] Patient Account Number: [id number] Date of Birth/Sex: February 03, 1966 (53 y.o. M) Treating RN: Arnette Norris Primary Care Provider: Birdie Sons, ERIC Other Clinician: Referring Provider: Birdie Sons, ERIC Treating Provider/Extender: Linwood Dibbles, HOYT Weeks in Treatment: 13 Information Obtained From Patient Wound History Do you currently have one or more open woundso Yes How many open wounds do you currently haveo 1 Approximately how long have you had your woundso 5 months How have you been treating your wound(s) until nowo open to air Has your wound(s) ever healed and then re-openedo No Have you had any lab work done in the past montho No Have you tested positive for an antibiotic resistant organism (MRSA, VRE)o No Have you tested positive for osteomyelitis (bone infection)o No Have you had any tests for circulation on your legso No Have you had other problems associated with your woundso Swelling Constitutional Symptoms (General Health) Complaints and Symptoms: Negative for: Fever; Chills Cardiovascular Complaints and Symptoms: Positive for: LE edema Medical  History: Negative for: Angina; Arrhythmia; Congestive Heart Failure; Coronary Artery Disease; Deep Vein Thrombosis; Hypertension; Hypotension; Myocardial Infarction; Peripheral Arterial Disease; Peripheral Venous Disease; Phlebitis; Vasculitis Eyes Medical History: Negative for: Cataracts; Glaucoma; Optic Neuritis Ear/Nose/Mouth/Throat Medical History: Negative for: Chronic sinus problems/congestion; Middle ear problems Hematologic/Lymphatic Medical History: Negative for: Anemia; Hemophilia; Human Immunodeficiency Virus; Lymphedema; Sickle Cell Disease Respiratory Complaints and Symptoms: No Complaints or Symptoms REDA, GETTIS ([id number]) Medical History: Positive for: Asthma - history as a child Negative for: Aspiration; Chronic Obstructive Pulmonary Disease (COPD); Pneumothorax; Sleep Apnea; Tuberculosis Gastrointestinal Medical History: Negative for: Cirrhosis ; Colitis; Crohnos; Hepatitis A; Hepatitis B; Hepatitis C Endocrine Medical History: Negative for: Type I Diabetes; Type II Diabetes Genitourinary Medical History: Negative for: End Stage Renal Disease Immunological Medical History: Negative for: Lupus Erythematosus; Raynaudos; Scleroderma Integumentary (Skin) Medical History: Negative for: History of Burn; History of pressure wounds Musculoskeletal Medical History: Positive for: Osteoarthritis - both hips Negative for: Gout; Rheumatoid Arthritis; Osteomyelitis Neurologic Medical History: Positive for: Seizure Disorder - as a child Negative for: Dementia; Neuropathy; Quadriplegia; Paraplegia Oncologic Medical History: Negative for: Received Chemotherapy; Received Radiation Psychiatric Complaints and Symptoms: No Complaints or Symptoms Medical History: Negative for: Anorexia/bulimia; Confinement Anxiety Immunizations Pneumococcal Vaccine: Received Pneumococcal Vaccination: No Implantable Devices KIRON, OSMUN ([id number]) Family and Social  History Cancer: Yes - Mother,Father,Siblings; Diabetes: Yes - Siblings; Heart Disease: Yes - Siblings; Hereditary Spherocytosis: No; Hypertension: Yes - Siblings; Kidney Disease: No; Lung Disease: No; Seizures: No; Stroke: No; Thyroid Problems: No; Tuberculosis: No; Former  smoker - 7 years; Marital Status - Single; Alcohol Use: Rarely; Drug Use: No History; Caffeine Use: Never; Financial Concerns: No; Food, Clothing or Shelter Needs: No; Support System Lacking: No; Transportation Concerns: No; Advanced Directives: No; Patient does not want information on Advanced Directives Physician Affirmation I have reviewed and agree with the above information. Electronic Signature(s) Signed: 11/08/2018 5:13:49 PM By: Arnette Norris Signed: 11/08/2018 9:58:15 PM By: Lenda Kelp PA-C Entered By: Lenda Kelp on 11/08/2018 13:59:43 [place], Maisie Fus ([id number]) -------------------------------------------------------------------------------- SuperBill Details Patient Name: Johnathan Scott Date of Service: 11/08/2018 Medical Record Number: [id number] Patient Account Number: [id number] Date of Birth/Sex: Jul 27, 1966 (53 y.o. M) Treating RN: Arnette Norris Primary Care Provider: Birdie Sons, ERIC Other Clinician: Referring Provider: Birdie Sons, ERIC Treating Provider/Extender: Linwood Dibbles, HOYT Weeks in Treatment: 13 Diagnosis Coding ICD-10 Codes Code Description I87.2 Venous insufficiency (chronic) (peripheral) L97.822 Non-pressure chronic ulcer of other part of left lower leg with fat layer exposed D50.9 Iron deficiency anemia, unspecified J45.998 Other asthma Facility Procedures CPT4 Code: 78295621 Description: 99213 - WOUND CARE VISIT-LEV 3 EST PT Modifier: Quantity: 1 Physician Procedures CPT4 Code Description: 3086578 99214 - WC PHYS LEVEL 4 - EST PT ICD-10 Diagnosis Description I87.2 Venous insufficiency (chronic) (peripheral) L97.822 Non-pressure chronic ulcer of other part of left lower  leg wit D50.9 Iron deficiency anemia,  unspecified J45.998 Other asthma Modifier: h fat layer expos Quantity: 1 ed Electronic Signature(s) Signed: 11/08/2018 9:58:15 PM By: Lenda Kelp PA-C Entered By: Lenda Kelp on 11/08/2018 14:00:27

## 2018-11-10 ENCOUNTER — Ambulatory Visit (INDEPENDENT_AMBULATORY_CARE_PROVIDER_SITE_OTHER): Payer: 59

## 2018-11-10 VITALS — BP 146/82

## 2018-11-10 DIAGNOSIS — I10 Essential (primary) hypertension: Secondary | ICD-10-CM

## 2018-11-10 NOTE — Progress Notes (Signed)
Patient came in to day for BP recheck after increasing HCTZ to 25 mg. Patient however stopped taking this medication due to it making him shaky & jittery. Patient was on nothing at the time of check. Patient wants to know if he should resume old dose?

## 2018-11-11 NOTE — Progress Notes (Signed)
I would suggest we try an alternative medication.  Based on reviewing his prior medications it does not appear that he has been on amlodipine previously and I would like to try this if he is willing.  Once you speak with him I can send this to the pharmacy.  He would need a repeat blood pressure check in 1 month.

## 2018-11-15 ENCOUNTER — Encounter: Payer: 59 | Admitting: Physician Assistant

## 2018-11-15 DIAGNOSIS — L97822 Non-pressure chronic ulcer of other part of left lower leg with fat layer exposed: Secondary | ICD-10-CM | POA: Diagnosis not present

## 2018-11-21 NOTE — Progress Notes (Signed)
Johnathan Scott, Johnathan Scott ([id number]) Visit Report for 11/15/2018 Arrival Information Details Patient Name: Johnathan Scott, Johnathan Scott Date of Service: 11/15/2018 2:00 PM Medical Record Number: [id number] Patient Account Number: [id number] Date of Birth/Sex: 1966-03-01 (53 y.o. M) Treating RN: Huel Coventry Primary Care Derrian Rodak: Birdie Sons, ERIC Other Clinician: Referring Leone Mobley: Birdie Sons, ERIC Treating Trasean Delima/Extender: Linwood Dibbles, HOYT Weeks in Treatment: 14 Visit Information History Since Last Visit Added or deleted any medications: No Patient Arrived: Cane Any new allergies or adverse reactions: No Arrival Time: 14:05 Had a fall or experienced change in No Accompanied By: self activities of daily living that may affect Transfer Assistance: None risk of falls: Patient Identification Verified: Yes Signs or symptoms of abuse/neglect since last visito No Secondary Verification Process Yes Hospitalized since last visit: No Completed: Implantable device outside of the clinic excluding No Patient Has Alerts: Yes cellular tissue based products placed in the center Patient Alerts: PT REFUSED FACE since last visit: PHOTO Has Dressing in Place as Prescribed: Yes Pain Present Now: No Electronic Signature(s) Signed: 11/15/2018 2:36:35 PM By: Dayton Martes RCP, RRT, CHT Entered By: Dayton Martes on 11/15/2018 14:06:25 Johnathan Scott ([id number]) -------------------------------------------------------------------------------- Encounter Discharge Information Details Patient Name: Johnathan Scott Date of Service: 11/15/2018 2:00 PM Medical Record Number: [id number] Patient Account Number: [id number] Date of Birth/Sex: [birth year] (53 y.o. M) Treating RN: Huel Coventry Primary Care Marcele Kosta: Birdie Sons, ERIC Other Clinician: Referring Cassity Christian: Birdie Sons, ERIC Treating Sebasthian Stailey/Extender: Linwood Dibbles, HOYT Weeks in Treatment: 14 Encounter Discharge Information Items Discharge  Condition: Stable Ambulatory Status: Ambulatory Discharge Destination: Home Transportation: Private Auto Accompanied By: self Schedule Follow-up Appointment: Yes Clinical Summary of Care: Electronic Signature(s) Signed: 11/15/2018 4:45:46 PM By: Elliot Gurney, BSN, RN, CWS, Kim RN, BSN Entered By: Elliot Gurney, BSN, RN, CWS, Kim on 11/15/2018 14:41:12 Johnathan Scott ([id number]) -------------------------------------------------------------------------------- Lower Extremity Assessment Details Patient Name: Johnathan Scott Date of Service: 11/15/2018 2:00 PM Medical Record Number: [id number] Patient Account Number: [id number] Date of Birth/Sex: 08/23/1966 (53 y.o. M) Treating RN: Rema Jasmine Primary Care Yehuda Printup: Birdie Sons, ERIC Other Clinician: Referring Fiona Coto: Birdie Sons, ERIC Treating Mehkai Gallo/Extender: Linwood Dibbles, HOYT Weeks in Treatment: 14 Edema Assessment Assessed: [Left: No] [Right: No] [Left: Edema] [Right: :] Calf Left: Right: Point of Measurement: 29 cm From Medial Instep 49.5 cm cm Ankle Left: Right: Point of Measurement: 12 cm From Medial Instep 28.5 cm cm Vascular Assessment Claudication: Claudication Assessment [Left:None] Pulses: Dorsalis Pedis Palpable: [Left:Yes] Posterior Tibial Extremity colors, hair growth, and conditions: Extremity Color: [Left:Hyperpigmented] Hair Growth on Extremity: [Left:No] Temperature of Extremity: [Left:Warm] Capillary Refill: [Left:< 3 seconds] Toe Nail Assessment Left: Right: Thick: Yes Discolored: Yes Deformed: Yes Improper Length and Hygiene: Yes Electronic Signature(s) Signed: 11/15/2018 2:52:40 PM By: Rema Jasmine Entered By: Rema Jasmine on 11/15/2018 14:24:53 Johnathan Scott ([id number]) -------------------------------------------------------------------------------- Multi Wound Chart Details Patient Name: Johnathan Scott Date of Service: 11/15/2018 2:00 PM Medical Record Number: [id number] Patient Account Number: [id number] Date  of Birth/Sex: 25-Oct-1965 (53 y.o. M) Treating RN: Huel Coventry Primary Care Loana Salvaggio: Birdie Sons, ERIC Other Clinician: Referring Saidee Geremia: Birdie Sons, ERIC Treating Jaymere Alen/Extender: Linwood Dibbles, HOYT Weeks in Treatment: 14 Vital Signs Height(in): 61 Pulse(bpm): 72 Weight(lbs): 232 Blood Pressure(mmHg): 161/88 Body Mass Index(BMI): 44 Temperature(F): 97.7 Respiratory Rate 16 (breaths/min): Photos: [N/A:N/A] Wound Location: Left Lower Leg - Anterior N/A N/A Wounding Event: Gradually Appeared N/A N/A Primary Etiology: Venous Leg Ulcer N/A N/A Comorbid History: Asthma, Osteoarthritis, N/A N/A Seizure Disorder Date Acquired: 03/08/2018 N/A N/A Weeks of Treatment: 14 N/A N/A Wound Status: Open N/A N/A Measurements L x W x D 1x1.3x0.1 N/A  N/A (cm) Area (cm) : 1.021 N/A N/A Volume (cm) : 0.102 N/A N/A % Reduction in Area: 89.70% N/A N/A % Reduction in Volume: 94.80% N/A N/A Classification: Full Thickness Without N/A N/A Exposed Support Structures Exudate Amount: Medium N/A N/A Exudate Type: Serous N/A N/A Exudate Color: amber N/A N/A Wound Margin: Flat and Intact N/A N/A Granulation Amount: None Present (0%) N/A N/A Necrotic Amount: Medium (34-66%) N/A N/A Exposed Structures: Fat Layer (Subcutaneous N/A N/A Tissue) Exposed: Yes Fascia: No Tendon: No Muscle: No Joint: No Bone: No Kaigler, Josephus ([id number]) Epithelialization: Small (1-33%) N/A N/A Periwound Skin Texture: Excoriation: No N/A N/A Induration: No Callus: No Crepitus: No Rash: No Scarring: No Periwound Skin Moisture: Dry/Scaly: Yes N/A N/A Maceration: No Periwound Skin Color: Hemosiderin Staining: Yes N/A N/A Atrophie Blanche: No Cyanosis: No Ecchymosis: No Erythema: No Mottled: No Pallor: No Rubor: No Temperature: No Abnormality N/A N/A Tenderness on Palpation: Yes N/A N/A Wound Preparation: Ulcer Cleansing: Other: soap N/A N/A and water Topical Anesthetic Applied: Other: lidocaine  4% Treatment Notes Electronic Signature(s) Signed: 11/15/2018 4:45:46 PM By: Elliot GurneyWoody, BSN, RN, CWS, Kim RN, BSN Entered By: Elliot GurneyWoody, BSN, RN, CWS, Kim on 11/15/2018 14:35:41 Johnathan SextonSHAMBLEY, Ventura ([id number]) -------------------------------------------------------------------------------- Multi-Disciplinary Care Plan Details Patient Name: Johnathan SextonSHAMBLEY, Jajuan Date of Service: 11/15/2018 2:00 PM Medical Record Number: [id number] Patient Account Number: [id number] Date of Birth/Sex: 22-Oct-1965 (52 y.o. M) Treating RN: Huel CoventryWoody, Kim Primary Care Deo Mehringer: Birdie SonsSONNENBERG, ERIC Other Clinician: Referring Sherril Shipman: Birdie SonsSONNENBERG, ERIC Treating Jeanette Rauth/Extender: Linwood DibblesSTONE III, HOYT Weeks in Treatment: 14 Active Inactive Necrotic Tissue Nursing Diagnoses: Knowledge deficit related to management of necrotic/devitalized tissue Goals: Necrotic/devitalized tissue will be minimized in the wound bed Date Initiated: 08/09/2018 Target Resolution Date: 09/09/2018 Goal Status: Active Interventions: Assess patient pain level pre-, during and post procedure and prior to discharge Treatment Activities: Apply topical anesthetic as ordered : 08/09/2018 Excisional debridement : 08/09/2018 Notes: Orientation to the Wound Care Program Nursing Diagnoses: Knowledge deficit related to the wound healing center program Goals: Patient/caregiver will verbalize understanding of the Wound Healing Center Program Date Initiated: 08/09/2018 Target Resolution Date: 09/09/2018 Goal Status: Active Interventions: Provide education on orientation to the wound center Notes: Wound/Skin Impairment Nursing Diagnoses: Impaired tissue integrity Knowledge deficit related to smoking impact on wound healing Goals: Ulcer/skin breakdown will have a volume reduction of 30% by week 4 Johnathan SextonSHAMBLEY, Veto ([id number]) Date Initiated: 08/09/2018 Target Resolution Date: 09/09/2018 Goal Status: Active Interventions: Assess patient/caregiver ability to  obtain necessary supplies Assess ulceration(s) every visit Treatment Activities: Topical wound management initiated : 08/09/2018 Notes: Electronic Signature(s) Signed: 11/15/2018 4:45:46 PM By: Elliot GurneyWoody, BSN, RN, CWS, Kim RN, BSN Entered By: Elliot GurneyWoody, BSN, RN, CWS, Kim on 11/15/2018 14:35:25 Johnathan SextonSHAMBLEY, Avyukt ([id number]) -------------------------------------------------------------------------------- Pain Assessment Details Patient Name: Johnathan SextonSHAMBLEY, Jovanie Date of Service: 11/15/2018 2:00 PM Medical Record Number: [id number] Patient Account Number: [id number] Date of Birth/Sex: 22-Oct-1965 (52 y.o. M) Treating RN: Huel CoventryWoody, Kim Primary Care Laterria Lasota: Birdie SonsSONNENBERG, ERIC Other Clinician: Referring Wynell Halberg: Birdie SonsSONNENBERG, ERIC Treating Iram Lundberg/Extender: Linwood DibblesSTONE III, HOYT Weeks in Treatment: 14 Active Problems Location of Pain Severity and Description of Pain Patient Has Paino No Site Locations Pain Management and Medication Current Pain Management: Electronic Signature(s) Signed: 11/15/2018 2:36:35 PM By: Dayton MartesWallace, RCP,RRT,CHT, Sallie RCP, RRT, CHT Signed: 11/15/2018 4:45:46 PM By: Elliot GurneyWoody, BSN, RN, CWS, Kim RN, BSN Entered By: Dayton MartesWallace, RCP,RRT,CHT, Sallie on 11/15/2018 14:06:36 Johnathan SextonSHAMBLEY, Claudie ([id number]) -------------------------------------------------------------------------------- Patient/Caregiver Education Details Patient Name: Johnathan SextonSHAMBLEY, Khaleb Date of Service: 11/15/2018 2:00 PM Medical Record Number: [id number] Patient Account Number: [id number] Date of Birth/Gender: 22-Oct-1965 (52  y.o. M) Treating RN: Huel Coventry Primary Care Physician: Birdie Sons, ERIC Other Clinician: Referring Physician: Birdie Sons, ERIC Treating Physician/Extender: Skeet Simmer in Treatment: 14 Education Assessment Education Provided To: Patient Education Topics Provided Wound/Skin Impairment: Handouts: Caring for Your Ulcer Methods: Demonstration, Explain/Verbal Responses: State content correctly Electronic  Signature(s) Signed: 11/15/2018 4:45:46 PM By: Elliot Gurney, BSN, RN, CWS, Kim RN, BSN Entered By: Elliot Gurney, BSN, RN, CWS, Kim on 11/15/2018 14:40:47 Johnathan Scott ([id number]) -------------------------------------------------------------------------------- Wound Assessment Details Patient Name: Johnathan Scott Date of Service: 11/15/2018 2:00 PM Medical Record Number: [id number] Patient Account Number: [id number] Date of Birth/Sex: August 07, 1966 (52 y.o. M) Treating RN: Huel Coventry Primary Care Talen Poser: Birdie Sons, ERIC Other Clinician: Referring Navaya Wiatrek: Birdie Sons, ERIC Treating Arlin Savona/Extender: Linwood Dibbles, HOYT Weeks in Treatment: 14 Wound Status Wound Number: 1 Primary Etiology: Venous Leg Ulcer Wound Location: Left, Anterior Lower Leg Wound Status: Open Wounding Event: Gradually Appeared Comorbid History: Asthma, Osteoarthritis, Seizure Disorder Date Acquired: 03/08/2018 Weeks Of Treatment: 14 Clustered Wound: No Photos Photo Uploaded By: Rema Jasmine on 11/15/2018 14:30:50 Wound Measurements Length: (cm) 1 Width: (cm) 1.1 Depth: (cm) 0.1 Area: (cm) 0.864 Volume: (cm) 0.086 % Reduction in Area: 91.3% % Reduction in Volume: 95.7% Epithelialization: Small (1-33%) Tunneling: No Undermining: No Wound Description Full Thickness Without Exposed Support Foul Odo Classification: Structures Slough/F Wound Margin: Flat and Intact Exudate Medium Amount: Exudate Type: Serous Exudate Color: amber r After Cleansing: No ibrino Yes Wound Bed Granulation Amount: None Present (0%) Exposed Structure Necrotic Amount: Medium (34-66%) Fascia Exposed: No Necrotic Quality: Adherent Slough Fat Layer (Subcutaneous Tissue) Exposed: Yes Tendon Exposed: No Muscle Exposed: No Joint Exposed: No Bone Exposed: No Mcchesney, Trelyn ([id number]) Periwound Skin Texture Texture Color No Abnormalities Noted: No No Abnormalities Noted: No Callus: No Atrophie Blanche: No Crepitus: No Cyanosis:  No Excoriation: No Ecchymosis: No Induration: No Erythema: No Rash: No Hemosiderin Staining: Yes Scarring: No Mottled: No Pallor: No Moisture Rubor: No No Abnormalities Noted: No Dry / Scaly: Yes Temperature / Pain Maceration: No Temperature: No Abnormality Tenderness on Palpation: Yes Wound Preparation Ulcer Cleansing: Other: soap and water, Topical Anesthetic Applied: Other: lidocaine 4%, Treatment Notes Wound #1 (Left, Anterior Lower Leg) Notes silvercel, abd, 3 layer wrap with unna to anchor Electronic Signature(s) Signed: 11/15/2018 4:45:46 PM By: Elliot Gurney, BSN, RN, CWS, Kim RN, BSN Entered By: Elliot Gurney, BSN, RN, CWS, Kim on 11/15/2018 14:36:35 Johnathan Scott ([id number]) -------------------------------------------------------------------------------- Vitals Details Patient Name: Johnathan Scott Date of Service: 11/15/2018 2:00 PM Medical Record Number: [id number] Patient Account Number: [id number] Date of Birth/Sex: 1966-09-21 (52 y.o. M) Treating RN: Huel Coventry Primary Care Katarzyna Wolven: Birdie Sons, ERIC Other Clinician: Referring Kristina Mcnorton: Birdie Sons, ERIC Treating Braelin Costlow/Extender: Linwood Dibbles, HOYT Weeks in Treatment: 14 Vital Signs Time Taken: 14:06 Temperature (F): 97.7 Height (in): 61 Pulse (bpm): 72 Weight (lbs): 232 Respiratory Rate (breaths/min): 16 Body Mass Index (BMI): 43.8 Blood Pressure (mmHg): 161/88 Reference Range: 80 - 120 mg / dl Airway Electronic Signature(s) Signed: 11/15/2018 2:36:35 PM By: Dayton Martes RCP, RRT, CHT Entered By: Dayton Martes on 11/15/2018 14:09:09

## 2018-11-22 ENCOUNTER — Ambulatory Visit (INDEPENDENT_AMBULATORY_CARE_PROVIDER_SITE_OTHER): Payer: 59 | Admitting: Family Medicine

## 2018-11-22 ENCOUNTER — Encounter: Payer: Self-pay | Admitting: Family Medicine

## 2018-11-22 VITALS — BP 160/90 | HR 66 | Temp 97.8°F | Ht 61.5 in | Wt 216.0 lb

## 2018-11-22 DIAGNOSIS — I10 Essential (primary) hypertension: Secondary | ICD-10-CM

## 2018-11-22 DIAGNOSIS — M25552 Pain in left hip: Secondary | ICD-10-CM | POA: Diagnosis not present

## 2018-11-22 DIAGNOSIS — M25551 Pain in right hip: Secondary | ICD-10-CM | POA: Insufficient documentation

## 2018-11-22 DIAGNOSIS — S81802A Unspecified open wound, left lower leg, initial encounter: Secondary | ICD-10-CM | POA: Diagnosis not present

## 2018-11-22 MED ORDER — LISINOPRIL 10 MG PO TABS: 10.0000 mg | ORAL_TABLET | Freq: Every day | ORAL | 0 refills | Status: DC

## 2018-11-22 NOTE — Patient Instructions (Signed)
Nice to see you. We will start you on lisinopril for your blood pressure.  It is important that we check your lab work in 1 week as this does work through the kidneys the lab work will allow [place] to know if your kidneys have tolerated this. We will check lab work in one week.

## 2018-11-22 NOTE — Progress Notes (Signed)
Marikay Alar, MD Phone: [phone]  Johnathan Scott. is a 53 y.o. male who presents today for follow-up.  CC: Hypertension, rash, leg wound, hip pain  Hypertension: Typically 160/81 at wound care.  Notes he got very jittery and had a headache with hydrochlorothiazide.  He also developed a rash on his upper arms.  Leg wound: Patient notes this is almost healed up.  He is seeing wound care currently with a dressing in place.  Hip pain: He notes bilateral hip pain left greater than right.  He was told he needed surgery years ago.  He notes his hips pop and hurt.  He notes difficulty lifting his left leg with his hip flexors.  This has been present for years and has not worsened.  He notes no injury.  He notes pain with walking.  Social History   Tobacco Use  Smoking Status Former Smoker  . Last attempt to quit: 02/08/2007  . Years since quitting: 11.7  Smokeless Tobacco Never Used     ROS see history of present illness  Objective  Physical Exam Vitals:   11/22/18 0806 11/22/18 0837  BP: (!) 150/90 (!) 160/90  Pulse: 66   Temp: 97.8 F (36.6 C)   SpO2: 99%     BP Readings from Last 3 Encounters:  11/22/18 (!) 160/90  11/10/18 (!) 146/82  09/09/18 138/80   Wt Readings from Last 3 Encounters:  11/22/18 216 lb (98 kg)  08/09/18 213 lb 3.2 oz (96.7 kg)  07/23/18 215 lb (97.5 kg)    Physical Exam Constitutional:      General: He is not in acute distress.    Appearance: He is not diaphoretic.  Cardiovascular:     Rate and Rhythm: Normal rate and regular rhythm.     Heart sounds: Normal heart sounds.  Pulmonary:     Effort: Pulmonary effort is normal.     Breath sounds: Normal breath sounds.  Musculoskeletal:     Comments: Decreased range of motion bilateral hips particularly worse on rotation, no discomfort  Skin:    General: Skin is warm and dry.  Neurological:     Mental Status: He is alert.     Comments: Inability to flex left hip, otherwise 5/5  strength right hip flexor, bilateral quads, hamstrings, plantarflexion and dorsiflexion, sensation to light touch intact bilateral LE      Assessment/Plan: Please see individual problem list.  HTN (hypertension) Elevated today. Trial lisinopril 10 mg daily. Discussed need and reasoning for the lab work. Discussed possible side effect of cough and monitoring for any lip, tongue, or throat swelling. Return for BP check as well.   Leg wound, left, initial encounter Continue to follow with the wound center.   Bilateral hip pain Suspect OA in hips and possible abnormalities in lumbar spine as causes of his current symptoms. Discussed x-ray of hips and lumbar spine though he declined. Discussed referral to ortho though he declined. He wanted to monitor until his leg wound was resolved.    Orders Placed This Encounter  Procedures  . Basic Metabolic Panel (BMET)    Standing Status:   Future    Standing Expiration Date:   11/23/2019    Meds ordered this encounter  Medications  . lisinopril (PRINIVIL,ZESTRIL) 10 MG tablet    Sig: Take 1 tablet (10 mg total) by mouth daily.    Dispense:  30 tablet    Refill:  0     Marikay Alar, MD [facility]  Station  

## 2018-11-22 NOTE — Assessment & Plan Note (Signed)
Suspect OA in hips and possible abnormalities in lumbar spine as causes of his current symptoms. Discussed x-ray of hips and lumbar spine though he declined. Discussed referral to ortho though he declined. He wanted to monitor until his leg wound was resolved.

## 2018-11-22 NOTE — Assessment & Plan Note (Signed)
Elevated today. Trial lisinopril 10 mg daily. Discussed need and reasoning for the lab work. Discussed possible side effect of cough and monitoring for any lip, tongue, or throat swelling. Return for BP check as well.

## 2018-11-22 NOTE — Assessment & Plan Note (Signed)
Continue to follow with the wound center.

## 2018-11-23 ENCOUNTER — Ambulatory Visit: Payer: 59 | Admitting: Physician Assistant

## 2018-11-25 ENCOUNTER — Ambulatory Visit: Payer: 59 | Admitting: Physician Assistant

## 2018-11-26 ENCOUNTER — Encounter: Payer: 59 | Attending: Family Medicine | Admitting: Family Medicine

## 2018-11-26 DIAGNOSIS — J45909 Unspecified asthma, uncomplicated: Secondary | ICD-10-CM | POA: Insufficient documentation

## 2018-11-26 DIAGNOSIS — I872 Venous insufficiency (chronic) (peripheral): Secondary | ICD-10-CM | POA: Diagnosis not present

## 2018-11-26 DIAGNOSIS — E11622 Type 2 diabetes mellitus with other skin ulcer: Secondary | ICD-10-CM | POA: Diagnosis not present

## 2018-11-26 DIAGNOSIS — Z833 Family history of diabetes mellitus: Secondary | ICD-10-CM | POA: Insufficient documentation

## 2018-11-26 DIAGNOSIS — M1990 Unspecified osteoarthritis, unspecified site: Secondary | ICD-10-CM | POA: Insufficient documentation

## 2018-11-26 DIAGNOSIS — Z87891 Personal history of nicotine dependence: Secondary | ICD-10-CM | POA: Insufficient documentation

## 2018-11-26 DIAGNOSIS — Z8249 Family history of ischemic heart disease and other diseases of the circulatory system: Secondary | ICD-10-CM | POA: Diagnosis not present

## 2018-11-26 DIAGNOSIS — G40909 Epilepsy, unspecified, not intractable, without status epilepticus: Secondary | ICD-10-CM | POA: Diagnosis not present

## 2018-11-26 DIAGNOSIS — L97822 Non-pressure chronic ulcer of other part of left lower leg with fat layer exposed: Secondary | ICD-10-CM | POA: Insufficient documentation

## 2018-11-26 DIAGNOSIS — D509 Iron deficiency anemia, unspecified: Secondary | ICD-10-CM | POA: Diagnosis not present

## 2018-11-26 DIAGNOSIS — Z809 Family history of malignant neoplasm, unspecified: Secondary | ICD-10-CM | POA: Insufficient documentation

## 2018-11-29 ENCOUNTER — Other Ambulatory Visit: Payer: 59

## 2018-12-01 NOTE — Progress Notes (Signed)
Johnathan Scott, Johnathan Scott ([id number]) Visit Report for 11/26/2018 Arrival Information Details Patient Name: Johnathan Scott, Johnathan Scott Date of Service: 11/26/2018 3:30 PM Medical Record Number: [id number] Patient Account Number: [id number] Date of Birth/Sex: [birth year] (53 y.o. M) Treating RN: Huel CoventryWoody, Kim Primary Care Bertina Guthridge: Birdie SonsSONNENBERG, ERIC Other Clinician: Referring Ameah Chanda: Birdie SonsSONNENBERG, ERIC Treating Kniyah Khun/Extender: Youlanda RoysKnight, Ikeshia Weeks in Treatment: 15 Visit Information History Since Last Visit Added or deleted any medications: No Patient Arrived: Cane Any new allergies or adverse reactions: No Arrival Time: 15:48 Had a fall or experienced change in No Accompanied By: self activities of daily living that may affect Transfer Assistance: None risk of falls: Patient Identification Verified: Yes Signs or symptoms of abuse/neglect since last visito No Secondary Verification Process Yes Hospitalized since last visit: No Completed: Implantable device outside of the clinic excluding No Patient Has Alerts: Yes cellular tissue based products placed in the center Patient Alerts: PT REFUSED FACE since last visit: PHOTO Has Dressing in Place as Prescribed: Yes Pain Present Now: No Electronic Signature(s) Signed: 11/26/2018 4:03:25 PM By: Dayton MartesWallace, RCP,RRT,CHT, Sallie RCP, RRT, CHT Entered By: Dayton MartesWallace, RCP,RRT,CHT, Sallie on 11/26/2018 15:52:27 Johnathan Scott, Johnathan Scott ([id number]) -------------------------------------------------------------------------------- Encounter Discharge Information Details Patient Name: Johnathan Scott, Johnathan Scott Date of Service: 11/26/2018 3:30 PM Medical Record Number: [id number] Patient Account Number: [id number] Date of Birth/Sex: [birth year] (53 y.o. M) Treating RN: Curtis Sitesorthy, Joanna Primary Care Huel Centola: Birdie SonsSONNENBERG, ERIC Other Clinician: Referring Yanessa Hocevar: Birdie SonsSONNENBERG, ERIC Treating Ticia Virgo/Extender: Youlanda RoysKnight, Ikeshia Weeks in Treatment: 15 Encounter Discharge Information Items Post  Procedure Vitals Discharge Condition: Stable Temperature (F): 98.0 Ambulatory Status: Cane Pulse (bpm): 61 Discharge Destination: Home Respiratory Rate (breaths/min): 18 Transportation: Private Auto Blood Pressure (mmHg): 177/75 Accompanied By: self Schedule Follow-up Appointment: Yes Clinical Summary of Care: Electronic Signature(s) Signed: 11/26/2018 5:14:06 PM By: Curtis Sitesorthy, Joanna Entered By: Curtis Sitesorthy, Joanna on 11/26/2018 17:14:06 Johnathan Scott, Johnathan Scott ([id number]) -------------------------------------------------------------------------------- Lower Extremity Assessment Details Patient Name: Johnathan Scott, Johnathan Scott Date of Service: 11/26/2018 3:30 PM Medical Record Number: [id number] Patient Account Number: [id number] Date of Birth/Sex: [birth year] (53 y.o. M) Treating RN: Rodell PernaScott, Dajea Primary Care Farren Nelles: Birdie SonsSONNENBERG, ERIC Other Clinician: Referring Raymundo Rout: Birdie SonsSONNENBERG, ERIC Treating Makelle Marrone/Extender: Youlanda RoysKnight, Ikeshia Weeks in Treatment: 15 Edema Assessment Assessed: [Left: No] [Right: No] Edema: [Left: Ye] [Right: s] Calf Left: Right: Point of Measurement: 29 cm From Medial Instep 49 cm cm Ankle Left: Right: Point of Measurement: 12 cm From Medial Instep 29 cm cm Vascular Assessment Pulses: Dorsalis Pedis Palpable: [Left:Yes] Posterior Tibial Extremity colors, hair growth, and conditions: Extremity Color: [Left:Hyperpigmented] Hair Growth on Extremity: [Left:No] Temperature of Extremity: [Left:Warm] Capillary Refill: [Left:< 3 seconds] Electronic Signature(s) Signed: 11/29/2018 8:00:18 AM By: Rodell PernaScott, Dajea Entered By: Rodell PernaScott, Dajea on 11/26/2018 16:13:21 Johnathan Scott, Jayko ([id number]) -------------------------------------------------------------------------------- Multi Wound Chart Details Patient Name: Johnathan Scott, Johnathan Scott Date of Service: 11/26/2018 3:30 PM Medical Record Number: [id number] Patient Account Number: [id number] Date of Birth/Sex: [birth year] (53 y.o. M) Treating RN:  Curtis Sitesorthy, Joanna Primary Care Cash Duce: Birdie SonsSONNENBERG, ERIC Other Clinician: Referring Adriyana Greenbaum: Birdie SonsSONNENBERG, ERIC Treating Staley Budzinski/Extender: Youlanda RoysKnight, Ikeshia Weeks in Treatment: 15 Vital Signs Height(in): 61 Pulse(bpm): 61 Weight(lbs): 232 Blood Pressure(mmHg): 177/75 Body Mass Index(BMI): 44 Temperature(F): 98.0 Respiratory Rate 16 (breaths/min): Photos: [1:No Photos] [N/A:N/A] Wound Location: [1:Left Lower Leg - Anterior] [N/A:N/A] Wounding Event: [1:Gradually Appeared] [N/A:N/A] Primary Etiology: [1:Venous Leg Ulcer] [N/A:N/A] Comorbid History: [1:Asthma, Osteoarthritis, Seizure Disorder] [N/A:N/A] Date Acquired: [1:03/08/2018] [N/A:N/A] Weeks of Treatment: [1:15] [N/A:N/A] Wound Status: [1:Open] [N/A:N/A] Measurements L x W x D [1:1x1x0.1] [N/A:N/A] (cm) Area (cm) : [1:0.785] [N/A:N/A] Volume (cm) : [1:0.079] [N/A:N/A] % Reduction in Area: [1:92.10%] [N/A:N/A] % Reduction  in Volume: [1:96.00%] [N/A:N/A] Classification: [1:Full Thickness Without Exposed Support Structures] [N/A:N/A] Exudate Amount: [1:Small] [N/A:N/A] Exudate Type: [1:Serous] [N/A:N/A] Exudate Color: [1:amber] [N/A:N/A] Wound Margin: [1:Flat and Intact] [N/A:N/A] Granulation Amount: [1:None Present (0%)] [N/A:N/A] Necrotic Amount: [1:Medium (34-66%)] [N/A:N/A] Exposed Structures: [1:Fat Layer (Subcutaneous Tissue) Exposed: Yes Fascia: No Tendon: No Muscle: No Joint: No Bone: No] [N/A:N/A] Epithelialization: [1:Small (1-33%)] [N/A:N/A] Periwound Skin Texture: [1:Excoriation: No Induration: No Callus: No Crepitus: No Rash: No Scarring: No] [N/A:N/A] Periwound Skin Moisture: Dry/Scaly: Yes N/A N/A Maceration: No Periwound Skin Color: Hemosiderin Staining: Yes N/A N/A Atrophie Blanche: No Cyanosis: No Ecchymosis: No Erythema: No Mottled: No Pallor: No Rubor: No Temperature: No Abnormality N/A N/A Tenderness on Palpation: No N/A N/A Wound Preparation: Ulcer Cleansing: Other: soap N/A N/A and  water Topical Anesthetic Applied: Other: lidocaine 4% Treatment Notes Electronic Signature(s) Signed: 11/26/2018 5:34:10 PM By: Curtis Sites Entered By: Curtis Sites on 11/26/2018 16:42:21 Johnathan Scott ([id number]) -------------------------------------------------------------------------------- Multi-Disciplinary Care Plan Details Patient Name: Johnathan Scott Date of Service: 11/26/2018 3:30 PM Medical Record Number: [id number] Patient Account Number: [id number] Date of Birth/Sex: 1966-01-25 (53 y.o. M) Treating RN: Curtis Sites Primary Care Oland Arquette: Birdie Sons, ERIC Other Clinician: Referring Aldena Worm: Birdie Sons, ERIC Treating Zakery Normington/Extender: Youlanda Roys in Treatment: 15 Active Inactive Necrotic Tissue Nursing Diagnoses: Knowledge deficit related to management of necrotic/devitalized tissue Goals: Necrotic/devitalized tissue will be minimized in the wound bed Date Initiated: 08/09/2018 Target Resolution Date: 09/09/2018 Goal Status: Active Interventions: Assess patient pain level pre-, during and post procedure and prior to discharge Treatment Activities: Apply topical anesthetic as ordered : 08/09/2018 Excisional debridement : 08/09/2018 Notes: Orientation to the Wound Care Program Nursing Diagnoses: Knowledge deficit related to the wound healing center program Goals: Patient/caregiver will verbalize understanding of the Wound Healing Center Program Date Initiated: 08/09/2018 Target Resolution Date: 09/09/2018 Goal Status: Active Interventions: Provide education on orientation to the wound center Notes: Wound/Skin Impairment Nursing Diagnoses: Impaired tissue integrity Knowledge deficit related to smoking impact on wound healing Goals: Ulcer/skin breakdown will have a volume reduction of 30% by week 4 NICKSON, MIDDLESWORTH ([id number]) Date Initiated: 08/09/2018 Target Resolution Date: 09/09/2018 Goal Status: Active Interventions: Assess  patient/caregiver ability to obtain necessary supplies Assess ulceration(s) every visit Treatment Activities: Topical wound management initiated : 08/09/2018 Notes: Electronic Signature(s) Signed: 11/26/2018 5:34:10 PM By: Curtis Sites Entered By: Curtis Sites on 11/26/2018 16:42:15 Johnathan Scott ([id number]) -------------------------------------------------------------------------------- Pain Assessment Details Patient Name: Johnathan Scott Date of Service: 11/26/2018 3:30 PM Medical Record Number: [id number] Patient Account Number: [id number] Date of Birth/Sex: 06-01-1966 (53 y.o. M) Treating RN: Huel Coventry Primary Care Jordell Outten: Birdie Sons, ERIC Other Clinician: Referring Bradely Rudin: Birdie Sons, ERIC Treating Ly Wass/Extender: Youlanda Roys in Treatment: 15 Active Problems Location of Pain Severity and Description of Pain Patient Has Paino No Site Locations Pain Management and Medication Current Pain Management: Electronic Signature(s) Signed: 11/26/2018 4:03:25 PM By: Dayton Martes RCP, RRT, CHT Signed: 11/30/2018 11:53:49 AM By: Elliot Gurney, BSN, RN, CWS, Kim RN, BSN Entered By: Dayton Martes on 11/26/2018 15:52:33 Johnathan Scott ([id number]) -------------------------------------------------------------------------------- Patient/Caregiver Education Details Patient Name: Johnathan Scott Date of Service: 11/26/2018 3:30 PM Medical Record Number: [id number] Patient Account Number: [id number] Date of Birth/Gender: [birth year] (53 y.o. M) Treating RN: Curtis Sites Primary Care Physician: Birdie Sons, ERIC Other Clinician: Referring Physician: Birdie Sons, ERIC Treating Physician/Extender: Youlanda Roys in Treatment: 15 Education Assessment Education Provided To: Patient Education Topics Provided Venous: Handouts: Other: wrap precautions Methods: Explain/Verbal Responses: State content correctly Electronic Signature(s) Signed:  11/26/2018 5:34:10 PM By: Curtis Sites  Entered By: Curtis Sites on 11/26/2018 17:09:44 Johnathan Scott ([id number]) -------------------------------------------------------------------------------- Wound Assessment Details Patient Name: SERGEY, CRAIGEN Date of Service: 11/26/2018 3:30 PM Medical Record Number: [id number] Patient Account Number: [id number] Date of Birth/Sex: 1966/08/15 (53 y.o. M) Treating RN: Rodell Perna Primary Care Asia Dusenbury: Birdie Sons, ERIC Other Clinician: Referring Shemicka Cohrs: Birdie Sons, ERIC Treating Melisia Leming/Extender: Youlanda Roys in Treatment: 15 Wound Status Wound Number: 1 Primary Etiology: Venous Leg Ulcer Wound Location: Left Lower Leg - Anterior Wound Status: Open Wounding Event: Gradually Appeared Comorbid History: Asthma, Osteoarthritis, Seizure Disorder Date Acquired: 03/08/2018 Weeks Of Treatment: 15 Clustered Wound: No Photos Photo Uploaded By: Curtis Sites on 11/26/2018 17:25:17 Wound Measurements Length: (cm) 1 Width: (cm) 1 Depth: (cm) 0.1 Area: (cm) 0.785 Volume: (cm) 0.079 % Reduction in Area: 92.1% % Reduction in Volume: 96% Epithelialization: Small (1-33%) Wound Description Full Thickness Without Exposed Support Classification: Structures Wound Margin: Flat and Intact Exudate Small Amount: Exudate Type: Serous Exudate Color: amber Foul Odor After Cleansing: No Slough/Fibrino Yes Wound Bed Granulation Amount: None Present (0%) Exposed Structure Necrotic Amount: Medium (34-66%) Fascia Exposed: No Necrotic Quality: Adherent Slough Fat Layer (Subcutaneous Tissue) Exposed: Yes Tendon Exposed: No Muscle Exposed: No Joint Exposed: No Bone Exposed: No Brislin, Chandler ([id number]) Periwound Skin Texture Texture Color No Abnormalities Noted: No No Abnormalities Noted: No Callus: No Atrophie Blanche: No Crepitus: No Cyanosis: No Excoriation: No Ecchymosis: No Induration: No Erythema: No Rash:  No Hemosiderin Staining: Yes Scarring: No Mottled: No Pallor: No Moisture Rubor: No No Abnormalities Noted: No Dry / Scaly: Yes Temperature / Pain Maceration: No Temperature: No Abnormality Wound Preparation Ulcer Cleansing: Other: soap and water, Topical Anesthetic Applied: Other: lidocaine 4%, Treatment Notes Wound #1 (Left, Anterior Lower Leg) Notes silvercel, abd, 3 layer wrap with unna to anchor Electronic Signature(s) Signed: 11/29/2018 8:00:18 AM By: Rodell Perna Entered By: Rodell Perna on 11/26/2018 16:11:31 Johnathan Scott ([id number]) -------------------------------------------------------------------------------- Vitals Details Patient Name: Johnathan Scott Date of Service: 11/26/2018 3:30 PM Medical Record Number: [id number] Patient Account Number: [id number] Date of Birth/Sex: October 11, 1966 (53 y.o. M) Treating RN: Huel Coventry Primary Care Zyion Leidner: Birdie Sons, ERIC Other Clinician: Referring Airiel Oblinger: Birdie Sons, ERIC Treating Tanaisha Pittman/Extender: Youlanda Roys in Treatment: 15 Vital Signs Time Taken: 15:52 Temperature (F): 98.0 Height (in): 61 Pulse (bpm): 61 Weight (lbs): 232 Respiratory Rate (breaths/min): 16 Body Mass Index (BMI): 43.8 Blood Pressure (mmHg): 177/75 Reference Range: 80 - 120 mg / dl Airway Electronic Signature(s) Signed: 11/26/2018 4:03:25 PM By: Dayton Martes RCP, RRT, CHT Entered By: Dayton Martes on 11/26/2018 15:57:25

## 2018-12-01 NOTE — Progress Notes (Signed)
Johnathan Scott, Serjio ([id number]) Visit Report for 11/26/2018 Chief Complaint Document Details Patient Name: Johnathan Scott, Johnathan Scott Date of Service: 11/26/2018 3:30 PM Medical Record Number: [id number] Patient Account Number: [id number] Date of Birth/Sex: 01-18-1966 (53 y.o. M) Treating RN: Huel CoventryWoody, Kim Primary Care Provider: Birdie SonsSONNENBERG, ERIC Other Clinician: Referring Provider: Birdie SonsSONNENBERG, ERIC Treating Provider/Extender: Youlanda RoysKnight, Ikeshia Weeks in Treatment: 15 Information Obtained from: Patient Chief Complaint Left Anterior LE Ulcer Electronic Signature(s) Signed: 11/27/2018 6:43:56 PM By: Wilkie AyeKnight, Ikeshia FNP-C Entered By: Wilkie AyeKnight, Ikeshia on 11/27/2018 16:50:17 Johnathan Scott, Johnathan Scott ([id number]) -------------------------------------------------------------------------------- Debridement Details Patient Name: Johnathan Scott, Johnathan Scott Date of Service: 11/26/2018 3:30 PM Medical Record Number: [id number] Patient Account Number: [id number] Date of Birth/Sex: 01-18-1966 (53 y.o. M) Treating RN: Curtis Sitesorthy, Joanna Primary Care Provider: Birdie SonsSONNENBERG, ERIC Other Clinician: Referring Provider: Birdie SonsSONNENBERG, ERIC Treating Provider/Extender: Youlanda RoysKnight, Ikeshia Weeks in Treatment: 15 Debridement Performed for Wound #1 Left,Anterior Lower Leg Assessment: Performed By: Physician Wilkie AyeKnight, Ikeshia, FNP Debridement Type: Debridement Severity of Tissue Pre Fat layer exposed Debridement: Level of Consciousness (Pre- Awake and Alert procedure): Pre-procedure Verification/Time Yes - 16:41 Out Taken: Start Time: 16:41 Pain Control: Lidocaine 4% Topical Solution Total Area Debrided (L x W): 1 (cm) x 1 (cm) = 1 (cm) Tissue and other material Non-Viable, Slough, Slough debrided: Level: Non-Viable Tissue Debridement Description: Selective/Open Wound Instrument: Curette Bleeding: None End Time: 16:43 Procedural Pain: 0 Post Procedural Pain: 0 Response to Treatment: Procedure was tolerated well Level of Consciousness Awake and  Alert (Post-procedure): Post Debridement Measurements of Total Wound Length: (cm) 1 Width: (cm) 1 Depth: (cm) 0.1 Volume: (cm) 0.079 Character of Wound/Ulcer Post Debridement: Improved Severity of Tissue Post Debridement: Fat layer exposed Post Procedure Diagnosis Same as Pre-procedure Electronic Signature(s) Signed: 11/26/2018 5:34:10 PM By: Curtis Sitesorthy, Joanna Signed: 11/27/2018 6:43:56 PM By: Wilkie AyeKnight, Ikeshia FNP-C Entered By: Curtis Sitesorthy, Joanna on 11/26/2018 16:44:44 Johnathan Scott, Johnathan Scott ([id number]) -------------------------------------------------------------------------------- HPI Details Patient Name: Johnathan Scott, Johnathan Scott Date of Service: 11/26/2018 3:30 PM Medical Record Number: [id number] Patient Account Number: [id number] Date of Birth/Sex: 01-18-1966 (53 y.o. M) Treating RN: Huel CoventryWoody, Kim Primary Care Provider: Birdie SonsSONNENBERG, ERIC Other Clinician: Referring Provider: Birdie SonsSONNENBERG, ERIC Treating Provider/Extender: Youlanda RoysKnight, Ikeshia Weeks in Treatment: 15 History of Present Illness HPI Description: 08/09/18 on evaluation today patient presents for initial evaluation and clinic as referral concerning an ulcer that he has on the left anterior lower Trinity which has been present for five months. He states that he actually noticed this after having an "insect bite" while driving down the road. He states that he did not see what kind of insect it was but he felt it wrong up his leg and then have the bite. Since that time is been having issues with the fact that this area does not want to heal. He does have a history of asthma as well as anemia is most recent CBC revealed a hemoglobin of 11.1 with a hematocrit of 34.6. He does have a history of panic attacks as well intermittently. No fevers, chills, nausea, or vomiting noted at this time. Specifically the patient does not appear to have any infection in regard to the left lower extremity. He's not on the current antibiotics. 08/16/18 upon evaluation today  patient actually appears to be doing significantly better in regard to his wound. He has been tolerating the dressing changes that he was performing on his own over the last week. Subsequently we did not wrap his leg at that time although we discussed it. We weren't sure that he was gonna be able to get his pants on top of the dressing.  Nonetheless the patient fortunately came today with shorts on he states that he is willing to be wrapped if it will help the wound to heal faster. He is pleased with the fact that the wound already appears to be doing better it's also not hurting as badly. The last debridement was a little bit more uncomfortable for him unfortunately. Nonetheless he states he's willing to allow me to debride it again today if I do so carefully. 08/24/18 upon evaluation today patient actually appears to be doing rather well in regard to his lower Trinity ulcer on the left. He has been tolerating the dressing changes with Iodoflex including the wrap without complication and the wound [place] into making signs of good progress. Overall I'm very pleased with how things appear at this time. No fevers, chills, nausea, or vomiting noted at this time. 09/03/18 on evaluation today patient actually appears to be doing rather well in regard to his wound. In fact the overall measurement belies the fact that the wound is actually healed quite a bit more than what the measurement would suggest. In fact it's almost half the size that it was during the last evaluation unfortunately a lot of the area where new skin has grown over is in between two areas that had to be clustered together one of the distal portion of the wound has not completely healed up. Nonetheless there is actually quite a bit more healing than what seems to be represented by the wound measurement today. Overall I'm very pleased with the overall progress and how he seems to be doing with the dressing changes. The wrap seems to be of  great benefit for him as well. 09/09/18 upon evaluation today patient actually appears to be doing excellent in regard to his left lotion the ulcer. He has been tolerating the dressing changes without complication. There does not appear to be any evidence of infection overall I think the [facility] Dressing has done excellent for him. I'm very pleased. 09/23/18 evaluation today patient actually appears to be doing poorly in regard to his lower extremity ulcer. There's a lot of maceration and skin breakdown around the actual wound itself which is making it difficult to even tell exactly what is going on. This is something he states started about four days ago. Nonetheless I'm concerned about the possibility of infection just being that he was not having near this amount of drainage previous. He also states in the past 24 hours the pain has been a little bit more significant. No fevers, chills, nausea, or vomiting noted at this time. 09/30/18 on evaluation today patient actually appears to be doing poorly in regard to his lower extremity ulcer. He's been tolerating the dressing changes although there is a lot of tape irritation surrounding the wound area. Subsequently I do think that he would benefit from [place] initiating treatment with all the compression wrap as we did before to avoid any additional injury to the area. No fevers, chills, nausea, or vomiting noted at this time. The patient did have a return of his wound culture which showed Pseudomonas which will not be covered by the Bactrim that I previously prescribed for him. With that being said the patient states that he did not know I was sitting in a prescription for him and therefore never picked this up he also states the pharmacy did not call him. Nonetheless this was something that was sent we will call the pharmacy and having counseling as JERRICK, FARVE ([id number]) I'm Clelia Schaumann  have to send them something different. 10/08/18 on  evaluation today patient's ulcer still appears to be much larger than what it was previous to the deterioration. With that being said the erythema surrounding the wound bed is not nearly as significant I do believe the antibiotic has been of help for him. He did finally get the Levaquin which is good news. No fevers, chills, nausea, or vomiting noted at this time. 10/12/18 on evaluation today patient actually appears to be doing much better in regard to his lower extremity ulcer. Overall I do believe the dressing change has done well for him at this time. I'm pleased with the overall progress even since just in the last week. 10/19/18 Seen today for follow up and management of left lower leg wound. Wound looks to be improving today and measuring smaller. Decreased surrounding erythema. No s/s of infection. Denies any increased pain, fever, or chills. 10/26/18 on evaluation today patient's wound actually appears to be doing very well. Unfortunately he has a new wound on the medial aspect of his lower extremity that is due to the wrap having slid down. He did not contact us as he did not know whether or not we be able to work him in. Nonetheless I told him in the future if this happens the definitely call us and let us know. 11/01/18 on evaluation today patient's lower Trinity ulcer appears to show signs of improvement compared to last week's evaluation. In fact the new area which blistered and arose last week appears to likely be completely healed at this time. Fortunately there is no sign of infection which is also good news. Overall I'm very pleased with how things seem to be progressing. 11/08/18 on evaluation today patient appears to be doing better in regard to his lower extremity ulcer. Fortunately there is no evidence of infection at this time. Overall he seems to be making good progress which is good news. 11/15/18 on evaluation today patient appears to be doing very well at this point in regard  to left anterior lower Trinity ulcer. This is not completely healed but does seem to be progressing in an appropriate fashion at this time. Fortunately there is no sign of infection currently. No fevers, chills, nausea, or vomiting noted at this time. 11/26/2018 Seen today for follow-up and management of left lower leg anterior ulcer. Wound continues to be progressing well. There is no evidence of infection. He did express wanting to end wound care and just to apply a Band-Aid over the wound. Discussed healing process and the need for ongoing compliance with wound care. He denies any immediate concerns during visit. No recent fevers, chills, or shortness of breath. Electronic Signature(s) Signed: 11/27/2018 6:43:56 PM By: Wilkie Aye FNP-C Entered By: Wilkie Aye on 11/27/2018 17:08:38 Johnathan Sexton ([id number]) -------------------------------------------------------------------------------- Physical Exam Details Patient Name: Johnathan Sexton Date of Service: 11/26/2018 3:30 PM Medical Record Number: [id number] Patient Account Number: [id number] Date of Birth/Sex: 1966/03/02 (53 y.o. M) Treating RN: Huel Coventry Primary Care Provider: Birdie Sons, ERIC Other Clinician: Referring Provider: Birdie Sons, ERIC Treating Provider/Extender: Youlanda Roys in Treatment: 15 Constitutional appears in no distress. Respiratory Respiratory effort is easy and symmetric bilaterally. Rate is normal at rest and on room air.. Cardiovascular Pedal pulses palpable bilaterally.. Integumentary (Hair, Skin) thin skin, left lower leg wound. Notes Wound exam: Left lower leg wound bed has some slough off to the surface of the wound. The wound did require sharp debridement today. There is a small surrounding area of erythema.  Edma seems to be very well controlled with the compression wrap. He expressed being tired of wearing compression wraps. He is agreeable to current treatment plan Electronic  Signature(s) Signed: 11/27/2018 6:43:56 PM By: Wilkie Aye FNP-C Entered By: Wilkie Aye on 11/27/2018 17:13:44 Johnathan Sexton ([id number]) -------------------------------------------------------------------------------- Physician Orders Details Patient Name: Johnathan Sexton Date of Service: 11/26/2018 3:30 PM Medical Record Number: [id number] Patient Account Number: [id number] Date of Birth/Sex: Nov 09, 1965 (53 y.o. M) Treating RN: Curtis Sites Primary Care Provider: Birdie Sons, ERIC Other Clinician: Referring Provider: Birdie Sons, ERIC Treating Provider/Extender: Youlanda Roys in Treatment: 15 Verbal / Phone Orders: No Diagnosis Coding Wound Cleansing Wound #1 Left,Anterior Lower Leg o Clean wound with Normal Saline. o May Shower, gently pat wound dry prior to applying new dressing. Anesthetic (add to Medication List) Wound #1 Left,Anterior Lower Leg o Topical Lidocaine 4% cream applied to wound bed prior to debridement (In Clinic Only). Primary Wound Dressing Wound #1 Left,Anterior Lower Leg o Silver Alginate Secondary Dressing Wound #1 Left,Anterior Lower Leg o ABD pad Dressing Change Frequency Wound #1 Left,Anterior Lower Leg o Change dressing every week Follow-up Appointments Wound #1 Left,Anterior Lower Leg o Return Appointment in 1 week. Edema Control Wound #1 Left,Anterior Lower Leg o 3 Layer Compression System - Left Lower Extremity o Elevate legs to the level of the heart and pump ankles as often as possible Electronic Signature(s) Signed: 11/26/2018 5:34:10 PM By: Curtis Sites Signed: 11/27/2018 6:43:56 PM By: Wilkie Aye FNP-C Entered By: Curtis Sites on 11/26/2018 16:45:14 Johnathan Sexton ([id number]) -------------------------------------------------------------------------------- Progress Note Details Patient Name: Johnathan Sexton Date of Service: 11/26/2018 3:30 PM Medical Record Number: [id number] Patient Account  Number: [id number] Date of Birth/Sex: 1966/08/22 (53 y.o. M) Treating RN: Huel Coventry Primary Care Provider: Birdie Sons, ERIC Other Clinician: Referring Provider: Birdie Sons, ERIC Treating Provider/Extender: Youlanda Roys in Treatment: 15 Subjective Chief Complaint Information obtained from Patient Left Anterior LE Ulcer History of Present Illness (HPI) 08/09/18 on evaluation today patient presents for initial evaluation and clinic as referral concerning an ulcer that he has on the left anterior lower Trinity which has been present for five months. He states that he actually noticed this after having an "insect bite" while driving down the road. He states that he did not see what kind of insect it was but he felt it wrong up his leg and then have the bite. Since that time is been having issues with the fact that this area does not want to heal. He does have a history of asthma as well as anemia is most recent CBC revealed a hemoglobin of 11.1 with a hematocrit of 34.6. He does have a history of panic attacks as well intermittently. No fevers, chills, nausea, or vomiting noted at this time. Specifically the patient does not appear to have any infection in regard to the left lower extremity. He's not on the current antibiotics. 08/16/18 upon evaluation today patient actually appears to be doing significantly better in regard to his wound. He has been tolerating the dressing changes that he was performing on his own over the last week. Subsequently we did not wrap his leg at that time although we discussed it. We weren't sure that he was gonna be able to get his pants on top of the dressing. Nonetheless the patient fortunately came today with shorts on he states that he is willing to be wrapped if it will help the wound to heal faster. He is pleased with the fact that the wound already appears to be  doing better it's also not hurting as badly. The last debridement was a little bit more  uncomfortable for him unfortunately. Nonetheless he states he's willing to allow me to debride it again today if I do so carefully. 08/24/18 upon evaluation today patient actually appears to be doing rather well in regard to his lower Trinity ulcer on the left. He has been tolerating the dressing changes with Iodoflex including the wrap without complication and the wound us into making signs of good progress. Overall I'm very pleased with how things appear at this time. No fevers, chills, nausea, or vomiting noted at this time. 09/03/18 on evaluation today patient actually appears to be doing rather well in regard to his wound. In fact the overall measurement belies the fact that the wound is actually healed quite a bit more than what the measurement would suggest. In fact it's almost half the size that it was during the last evaluation unfortunately a lot of the area where new skin has grown over is in between two areas that had to be clustered together one of the distal portion of the wound has not completely healed up. Nonetheless there is actually quite a bit more healing than what seems to be represented by the wound measurement today. Overall I'm very pleased with the overall progress and how he seems to be doing with the dressing changes. The wrap seems to be of great benefit for him as well. 09/09/18 upon evaluation today patient actually appears to be doing excellent in regard to his left lotion the ulcer. He has been tolerating the dressing changes without complication. There does not appear to be any evidence of infection overall [facility] excellent for him. I'm very pleased. 09/23/18 evaluation today patient actually appears to be doing poorly in regard to his lower extremity ulcer. There's a lot of maceration and skin breakdown around the actual wound itself which is making it difficult to even tell exactly what is going on. This is something he states  started about four days ago. Nonetheless I'm concerned about the possibility of infection just being that he was not having near this amount of drainage previous. He also states in the past 24 hours the pain has been a little bit more significant. No fevers, chills, nausea, or vomiting noted at this time. 09/30/18 on evaluation today patient actually appears to be doing poorly in regard to his lower extremity ulcer. He's been tolerating the dressing changes although there is a lot of tape irritation surrounding the wound area. Subsequently I do think [facility] ([id number]) that he would benefit from us initiating treatment with all the compression wrap as we did before to avoid any additional injury to the area. No fevers, chills, nausea, or vomiting noted at this time. The patient did have a return of his wound culture which showed Pseudomonas which will not be covered by the Bactrim that I previously prescribed for him. With that being said the patient states that he did not know I was sitting in a prescription for him and therefore never picked this up he also states the pharmacy did not call him. Nonetheless this was something that was sent we will call the pharmacy and having counseling as I'm gonna have to send them something different. 10/08/18 on evaluation today patient's ulcer still appears to be much larger than what it was previous to the deterioration. With that being said the erythema surrounding the wound bed is not nearly  as significant I do believe the antibiotic has been of help for him. He did finally get the Levaquin which is good news. No fevers, chills, nausea, or vomiting noted at this time. 10/12/18 on evaluation today patient actually appears to be doing much better in regard to his lower extremity ulcer. Overall I do believe the dressing change has done well for him at this time. I'm pleased with the overall progress even since just in the last week. 10/19/18 Seen  today for follow up and management of left lower leg wound. Wound looks to be improving today and measuring smaller. Decreased surrounding erythema. No s/s of infection. Denies any increased pain, fever, or chills. 10/26/18 on evaluation today patient's wound actually appears to be doing very well. Unfortunately he has a new wound on the medial aspect of his lower extremity that is due to the wrap having slid down. He did not contact us as he did not know whether or not we be able to work him in. Nonetheless I told him in the future if this happens the definitely call us and let us know. 11/01/18 on evaluation today patient's lower Trinity ulcer appears to show signs of improvement compared to last week's evaluation. In fact the new area which blistered and arose last week appears to likely be completely healed at this time. Fortunately there is no sign of infection which is also good news. Overall I'm very pleased with how things seem to be progressing. 11/08/18 on evaluation today patient appears to be doing better in regard to his lower extremity ulcer. Fortunately there is no evidence of infection at this time. Overall he seems to be making good progress which is good news. 11/15/18 on evaluation today patient appears to be doing very well at this point in regard to left anterior lower Trinity ulcer. This is not completely healed but does seem to be progressing in an appropriate fashion at this time. Fortunately there is no sign of infection currently. No fevers, chills, nausea, or vomiting noted at this time. 11/26/2018 Seen today for follow-up and management of left lower leg anterior ulcer. Wound continues to be progressing well. There is no evidence of infection. He did express wanting to end wound care and just to apply a Band-Aid over the wound. Discussed healing process and the need for ongoing compliance with wound care. He denies any immediate concerns during visit. No recent fevers, chills,  or shortness of breath. Patient History Information obtained from Patient. Family History Cancer - Mother,Father,Siblings, Diabetes - Siblings, Heart Disease - Siblings, Hypertension - Siblings, No family history of Hereditary Spherocytosis, Kidney Disease, Lung Disease, Seizures, Stroke, Thyroid Problems, Tuberculosis. Social History Former smoker - 7 years, Marital Status - Single, Alcohol Use - Rarely, Drug Use - No History, Caffeine Use - Never. Medical History Eyes Denies history of Cataracts, Glaucoma, Optic Neuritis Ear/Nose/Mouth/Throat Denies history of Chronic sinus problems/congestion, Middle ear problems Hematologic/Lymphatic Denies history of Anemia, Hemophilia, Human Immunodeficiency Virus, Lymphedema, Sickle Cell Disease Respiratory BLISS, TSANG ([id number]) Patient has history of Asthma - history as a child Denies history of Aspiration, Chronic Obstructive Pulmonary Disease (COPD), Pneumothorax, Sleep Apnea, Tuberculosis Cardiovascular Denies history of Angina, Arrhythmia, Congestive Heart Failure, Coronary Artery Disease, Deep Vein Thrombosis, Hypertension, Hypotension, Myocardial Infarction, Peripheral Arterial Disease, Peripheral Venous Disease, Phlebitis, Vasculitis Gastrointestinal Denies history of Cirrhosis , Colitis, Crohn s, Hepatitis A, Hepatitis B, Hepatitis C Endocrine Denies history of Type I Diabetes, Type II Diabetes Genitourinary Denies history of End Stage Renal Disease  Immunological Denies history of Lupus Erythematosus, Raynaud s, Scleroderma Integumentary (Skin) Denies history of History of Burn, History of pressure wounds Musculoskeletal Patient has history of Osteoarthritis - both hips Denies history of Gout, Rheumatoid Arthritis, Osteomyelitis Neurologic Patient has history of Seizure Disorder - as a child Denies history of Dementia, Neuropathy, Quadriplegia, Paraplegia Oncologic Denies history of Received Chemotherapy, Received  Radiation Psychiatric Denies history of Anorexia/bulimia, Confinement Anxiety Review of Systems (ROS) Constitutional Symptoms (General Health) The patient has no complaints or symptoms. Respiratory The patient has no complaints or symptoms. Cardiovascular The patient has no complaints or symptoms. Integumentary (Skin) Complains or has symptoms of Wounds - Left lower leg. Objective Constitutional appears in no distress. Vitals Time Taken: 3:52 PM, Height: 61 in, Weight: 232 lbs, BMI: 43.8, Temperature: 98.0 F, Pulse: 61 bpm, Respiratory Rate: 16 breaths/min, Blood Pressure: 177/75 mmHg. Respiratory Respiratory effort is easy and symmetric bilaterally. Rate is normal at rest and on room air.. Cardiovascular Pedal pulses palpable bilaterally.Marland Kitchen [place], Maisie Fus ([id number]) General Notes: Wound exam: Left lower leg wound bed has some slough off to the surface of the wound. The wound did require sharp debridement today. There is a small surrounding area of erythema. Edma seems to be very well controlled with the compression wrap. He expressed being tired of wearing compression wraps. He is agreeable to current treatment plan Integumentary (Hair, Skin) thin skin, left lower leg wound. Wound #1 status is Open. Original cause of wound was Gradually Appeared. The wound is located on the Left,Anterior Lower Leg. The wound measures 1cm length x 1cm width x 0.1cm depth; 0.785cm^2 area and 0.079cm^3 volume. There is Fat Layer (Subcutaneous Tissue) Exposed exposed. There is a small amount of serous drainage noted. The wound margin is flat and intact. There is no granulation within the wound bed. There is a medium (34-66%) amount of necrotic tissue within the wound bed including Adherent Slough. The periwound skin appearance exhibited: Dry/Scaly, Hemosiderin Staining. The periwound skin appearance did not exhibit: Callus, Crepitus, Excoriation, Induration, Rash, Scarring, Maceration,  Atrophie Blanche, Cyanosis, Ecchymosis, Mottled, Pallor, Rubor, Erythema. Periwound temperature was noted as No Abnormality. 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 There was a Selective/Open Wound Non-Viable Tissue Debridement with a total area of 1 sq cm performed by Wilkie Aye, FNP. With the following instrument(s): Curette to remove Non-Viable tissue/material. Material removed includes [facility] after achieving pain control using Lidocaine 4% Topical Solution. No specimens were taken. A time out was conducted at 16:41, prior to the start of the procedure. There was no bleeding. The procedure was tolerated well with a pain level of 0 throughout and a pain level of 0 following the procedure. Post Debridement Measurements: 1cm length x 1cm width x 0.1cm depth; 0.079cm^3 volume. Character of Wound/Ulcer Post Debridement is improved. Severity of Tissue Post Debridement is: Fat layer exposed. Post procedure Diagnosis Wound #1: Same as Pre-Procedure Plan Wound Cleansing: Wound #1 Left,Anterior Lower Leg: Clean wound with Normal Saline. May Shower, gently pat wound dry prior to applying new dressing. Anesthetic (add to Medication List): Wound #1 Left,Anterior Lower Leg: Topical Lidocaine 4% cream applied to wound bed prior to debridement (In Clinic Only). Primary Wound Dressing: Wound #1 Left,Anterior Lower Leg: Silver Alginate Secondary Dressing: Wound #1 Left,Anterior Lower Leg: ABD pad Dressing Change Frequency: Wound #1 Left,Anterior Lower Leg: Change dressing every week Follow-up Appointments: SENON, NIXON ([id number]) Wound #  1 Left,Anterior Lower Leg: Return Appointment in 1 week. Edema Control: Wound #1 Left,Anterior Lower Leg: 3 Layer Compression System - Left Lower Extremity Elevate legs to the level of the heart and pump ankles as  often as possible Electronic Signature(s) Signed: 11/27/2018 6:43:56 PM By: Wilkie Aye FNP-C Entered By: Wilkie Aye on 11/27/2018 17:28:10 Johnathan Sexton ([id number]) -------------------------------------------------------------------------------- ROS/PFSH Details Patient Name: Johnathan Sexton Date of Service: 11/26/2018 3:30 PM Medical Record Number: [id number] Patient Account Number: [id number] Date of Birth/Sex: 08-07-1966 (53 y.o. M) Treating RN: Huel Coventry Primary Care Provider: Birdie Sons, ERIC Other Clinician: Referring Provider: Birdie Sons, ERIC Treating Provider/Extender: Youlanda Roys in Treatment: 15 Information Obtained From Patient Wound History Do you currently have one or more open woundso Yes How many open wounds do you currently haveo 1 Approximately how long have you had your woundso 5 months How have you been treating your wound(s) until nowo open to air Has your wound(s) ever healed and then re-openedo No Have you had any lab work done in the past montho No Have you tested positive for an antibiotic resistant organism (MRSA, VRE)o No Have you tested positive for osteomyelitis (bone infection)o No Have you had any tests for circulation on your legso No Have you had other problems associated with your woundso Swelling Integumentary (Skin) Complaints and Symptoms: Positive for: Wounds - Left lower leg Medical History: Negative for: History of Burn; History of pressure wounds Constitutional Symptoms (General Health) Complaints and Symptoms: No Complaints or Symptoms Eyes Medical History: Negative for: Cataracts; Glaucoma; Optic Neuritis Ear/Nose/Mouth/Throat Medical History: Negative for: Chronic sinus problems/congestion; Middle ear problems Hematologic/Lymphatic Medical History: Negative for: Anemia; Hemophilia; Human Immunodeficiency Virus; Lymphedema; Sickle Cell Disease Respiratory Complaints and Symptoms: No Complaints or  Symptoms Medical History: Positive for: Asthma - history as a child NILESH, STEGALL ([id number]) Negative for: Aspiration; Chronic Obstructive Pulmonary Disease (COPD); Pneumothorax; Sleep Apnea; Tuberculosis Cardiovascular Complaints and Symptoms: No Complaints or Symptoms Medical History: Negative for: Angina; Arrhythmia; Congestive Heart Failure; Coronary Artery Disease; Deep Vein Thrombosis; Hypertension; Hypotension; Myocardial Infarction; Peripheral Arterial Disease; Peripheral Venous Disease; Phlebitis; Vasculitis Gastrointestinal Medical History: Negative for: Cirrhosis ; Colitis; Crohnos; Hepatitis A; Hepatitis B; Hepatitis C Endocrine Medical History: Negative for: Type I Diabetes; Type II Diabetes Genitourinary Medical History: Negative for: End Stage Renal Disease Immunological Medical History: Negative for: Lupus Erythematosus; Raynaudos; Scleroderma Musculoskeletal Medical History: Positive for: Osteoarthritis - both hips Negative for: Gout; Rheumatoid Arthritis; Osteomyelitis Neurologic Medical History: Positive for: Seizure Disorder - as a child Negative for: Dementia; Neuropathy; Quadriplegia; Paraplegia Oncologic Medical History: Negative for: Received Chemotherapy; Received Radiation Psychiatric Medical History: Negative for: Anorexia/bulimia; Confinement Anxiety Immunizations Pneumococcal Vaccine: Received Pneumococcal Vaccination: No Implantable Devices MILAN, PERKINS ([id number]) Family and Social History Cancer: Yes - Mother,Father,Siblings; Diabetes: Yes - Siblings; Heart Disease: Yes - Siblings; Hereditary Spherocytosis: No; Hypertension: Yes - Siblings; Kidney Disease: No; Lung Disease: No; Seizures: No; Stroke: No; Thyroid Problems: No; Tuberculosis: No; Former smoker - 7 years; Marital Status - Single; Alcohol Use: Rarely; Drug Use: No History; Caffeine Use: Never; Financial Concerns: No; Food, Clothing or Shelter Needs: No; Support System  Lacking: No; Transportation Concerns: No; Advanced Directives: No; Patient does not want information on Advanced Directives Physician Affirmation I have reviewed and agree with the above information. Electronic Signature(s) Signed: 11/27/2018 6:43:56 PM By: Wilkie Aye FNP-C Signed: 11/30/2018 11:53:49 AM By: Elliot Gurney, BSN, RN, CWS, Kim RN, BSN Entered By: Wilkie Aye on 11/27/2018 17:09:37 Johnathan Sexton ([id number]) -------------------------------------------------------------------------------- SuperBill Details Patient Name: Johnathan Sexton Date  of Service: 11/26/2018 Medical Record Number: [id number] Patient Account Number: [id number] Date of Birth/Sex: May 15, 1966 (53 y.o. M) Treating RN: Huel Coventry Primary Care Provider: Birdie Sons, ERIC Other Clinician: Referring Provider: Birdie Sons, ERIC Treating Provider/Extender: Youlanda Roys in Treatment: 15 Diagnosis Coding ICD-10 Codes Code Description I87.2 Venous insufficiency (chronic) (peripheral) L97.822 Non-pressure chronic ulcer of other part of left lower leg with fat layer exposed D50.9 Iron deficiency anemia, unspecified J45.998 Other asthma Facility Procedures CPT4 Code Description: [id number] - DEBRIDE WOUND 1ST 20 SQ CM OR < ICD-10 Diagnosis Description I87.2 Venous insufficiency (chronic) (peripheral) L97.822 Non-pressure chronic ulcer of other part of left lower leg with Modifier: fat layer expos Quantity: 1 ed Physician Procedures CPT4 Code Description: [id number] - WC PHYS DEBR WO ANESTH 20 SQ CM ICD-10 Diagnosis Description I87.2 Venous insufficiency (chronic) (peripheral) L97.822 Non-pressure chronic ulcer of other part of left lower leg with Modifier: fat layer expos Quantity: 1 ed Electronic Signature(s) Signed: 11/27/2018 6:43:56 PM By: Wilkie Aye FNP-C Entered By: Wilkie Aye on 11/27/2018 17:29:30

## 2018-12-02 ENCOUNTER — Encounter: Payer: 59 | Admitting: Physician Assistant

## 2018-12-02 DIAGNOSIS — L97822 Non-pressure chronic ulcer of other part of left lower leg with fat layer exposed: Secondary | ICD-10-CM | POA: Diagnosis not present

## 2018-12-02 DIAGNOSIS — E11622 Type 2 diabetes mellitus with other skin ulcer: Secondary | ICD-10-CM | POA: Diagnosis not present

## 2018-12-02 DIAGNOSIS — I872 Venous insufficiency (chronic) (peripheral): Secondary | ICD-10-CM | POA: Diagnosis not present

## 2018-12-05 NOTE — Progress Notes (Signed)
MARRIO, KWOLEK ([id number]) Visit Report for 12/02/2018 Chief Complaint Document Details Patient Name: Johnathan Scott Date of Service: 12/02/2018 2:30 PM Medical Record Number: [id number] Patient Account Number: [id number] Date of Birth/Sex: 12-Dec-1965 (53 y.o. M) Treating RN: Curtis Sites Primary Care Provider: Birdie Sons, ERIC Other Clinician: Referring Provider: Birdie Sons, ERIC Treating Provider/Extender: Linwood Dibbles, Zanaiya Calabria Weeks in Treatment: 16 Information Obtained from: Patient Chief Complaint Left Anterior LE Ulcer Electronic Signature(s) Signed: 12/03/2018 8:42:52 AM By: Lenda Kelp PA-C Entered By: Lenda Kelp on 12/03/2018 02:02:31 Johnathan Scott ([id number]) -------------------------------------------------------------------------------- HPI Details Patient Name: Johnathan Scott Date of Service: 12/02/2018 2:30 PM Medical Record Number: [id number] Patient Account Number: [id number] Date of Birth/Sex: 04/27/1966 (53 y.o. M) Treating RN: Curtis Sites Primary Care Provider: Birdie Sons, ERIC Other Clinician: Referring Provider: Birdie Sons, ERIC Treating Provider/Extender: Linwood Dibbles, Calieb Lichtman Weeks in Treatment: 16 History of Present Illness HPI Description: 08/09/18 on evaluation today patient presents for initial evaluation and clinic as referral concerning an ulcer that he has on the left anterior lower Trinity which has been present for five months. He states that he actually noticed this after having an "insect bite" while driving down the road. He states that he did not see what kind of insect it was but he felt it wrong up his leg and then have the bite. Since that time is been having issues with the fact that this area does not want to heal. He does have a history of asthma as well as anemia is most recent CBC revealed a hemoglobin of 11.1 with a hematocrit of 34.6. He does have a history of panic attacks as well intermittently. No fevers, chills, nausea, or  vomiting noted at this time. Specifically the patient does not appear to have any infection in regard to the left lower extremity. He's not on the current antibiotics. 08/16/18 upon evaluation today patient actually appears to be doing significantly better in regard to his wound. He has been tolerating the dressing changes that he was performing on his own over the last week. Subsequently we did not wrap his leg at that time although we discussed it. We weren't sure that he was gonna be able to get his pants on top of the dressing. Nonetheless the patient fortunately came today with shorts on he states that he is willing to be wrapped if it will help the wound to heal faster. He is pleased with the fact that the wound already appears to be doing better it's also not hurting as badly. The last debridement was a little bit more uncomfortable for him unfortunately. Nonetheless he states he's willing to allow me to debride it again today if I do so carefully. 08/24/18 upon evaluation today patient actually appears to be doing rather well in regard to his lower Trinity ulcer on the left. He has been tolerating the dressing changes with Iodoflex including the wrap without complication and the wound [place] into making signs of good progress. Overall I'm very pleased with how things appear at this time. No fevers, chills, nausea, or vomiting noted at this time. 09/03/18 on evaluation today patient actually appears to be doing rather well in regard to his wound. In fact the overall measurement belies the fact that the wound is actually healed quite a bit more than what the measurement would suggest. In fact it's almost half the size that it was during the last evaluation unfortunately a lot of the area where new skin has grown over is in between two areas that had to  be clustered together one of the distal portion of the wound has not completely healed up. Nonetheless there is actually quite a bit more healing  than what seems to be represented by the wound measurement today. Overall I'm very pleased with the overall progress and how he seems to be doing with the dressing changes. The wrap seems to be of great benefit for him as well. 09/09/18 upon evaluation today patient actually appears to be doing excellent in regard to his left lotion the ulcer. He has been tolerating the dressing changes without complication. There does not appear to be any evidence of infection overall I think the [facility] Dressing has done excellent for him. I'm very pleased. 09/23/18 evaluation today patient actually appears to be doing poorly in regard to his lower extremity ulcer. There's a lot of maceration and skin breakdown around the actual wound itself which is making it difficult to even tell exactly what is going on. This is something he states started about four days ago. Nonetheless I'm concerned about the possibility of infection just being that he was not having near this amount of drainage previous. He also states in the past 24 hours the pain has been a little bit more significant. No fevers, chills, nausea, or vomiting noted at this time. 09/30/18 on evaluation today patient actually appears to be doing poorly in regard to his lower extremity ulcer. He's been tolerating the dressing changes although there is a lot of tape irritation surrounding the wound area. Subsequently I do think that he would benefit from us initiating treatment with all the compression wrap as we did before to avoid any additional injury to the area. No fevers, chills, nausea, or vomiting noted at this time. The patient did have a return of his wound culture which showed Pseudomonas which will not be covered by the Bactrim that I previously prescribed for him. With that being said the patient states that he did not know I was sitting in a prescription for him and therefore never picked this up he also states the pharmacy did not call  him. Nonetheless this was something that was sent we will call the pharmacy and having counseling as Johnathan Scott ([id number]) I'm gonna have to send them something different. 10/08/18 on evaluation today patient's ulcer still appears to be much larger than what it was previous to the deterioration. With that being said the erythema surrounding the wound bed is not nearly as significant I do believe the antibiotic has been of help for him. He did finally get the Levaquin which is good news. No fevers, chills, nausea, or vomiting noted at this time. 10/12/18 on evaluation today patient actually appears to be doing much better in regard to his lower extremity ulcer. Overall I do believe the dressing change has done well for him at this time. I'm pleased with the overall progress even since just in the last week. 10/19/18 Seen today for follow up and management of left lower leg wound. Wound looks to be improving today and measuring smaller. Decreased surrounding erythema. No s/s of infection. Denies any increased pain, fever, or chills. 10/26/18 on evaluation today patient's wound actually appears to be doing very well. Unfortunately he has a new wound on the medial aspect of his lower extremity that is due to the wrap having slid down. He did not contact us as he did not know whether or not we be able to work him in. Nonetheless I told him in the future if  this happens the definitely call us and let us know. 11/01/18 on evaluation today patient's lower Trinity ulcer appears to show signs of improvement compared to last week's evaluation. In fact the new area which blistered and arose last week appears to likely be completely healed at this time. Fortunately there is no sign of infection which is also good news. Overall I'm very pleased with how things seem to be progressing. 11/08/18 on evaluation today patient appears to be doing better in regard to his lower extremity ulcer. Fortunately there is  no evidence of infection at this time. Overall he seems to be making good progress which is good news. 11/15/18 on evaluation today patient appears to be doing very well at this point in regard to left anterior lower Trinity ulcer. This is not completely healed but does seem to be progressing in an appropriate fashion at this time. Fortunately there is no sign of infection currently. No fevers, chills, nausea, or vomiting noted at this time. 11/26/2018 Seen today for follow-up and management of left lower leg anterior ulcer. Wound continues to be progressing well. There is no evidence of infection. He did express wanting to end wound care and just to apply a Band-Aid over the wound. Discussed healing process and the need for ongoing compliance with wound care. He denies any immediate concerns during visit. No recent fevers, chills, or shortness of breath. 12/02/18 on evaluation today patient actually appears to be doing very well in regard to his lower extremity ulcer which is shown signs of good improvement. Fortunately there is no sign of infection at this time. Patient has been tolerating the dressing changes without complication. I'm very pleased with how things have been progressing. No fevers, chills, nausea, or vomiting noted at this time. Electronic Signature(s) Signed: 12/03/2018 8:42:52 AM By: Lenda Kelp PA-C Entered By: Lenda Kelp on 12/03/2018 02:03:03 Johnathan Scott ([id number]) -------------------------------------------------------------------------------- Physical Exam Details Patient Name: Johnathan Scott Date of Service: 12/02/2018 2:30 PM Medical Record Number: [id number] Patient Account Number: [id number] Date of Birth/Sex: Jan 22, 1966 (53 y.o. M) Treating RN: Curtis Sites Primary Care Provider: Birdie Sons, ERIC Other Clinician: Referring Provider: Birdie Sons, ERIC Treating Provider/Extender: STONE III, Shakora Nordquist Weeks in Treatment: 16 Constitutional Well-nourished  and well-hydrated in no acute distress. Respiratory normal breathing without difficulty. clear to auscultation bilaterally. Cardiovascular regular rate and rhythm with normal S1, S2. Psychiatric this patient is able to make decisions and demonstrates good insight into disease process. Alert and Oriented x 3. pleasant and cooperative. Notes Patient's wound bed currently shows signs of good granulation and epithelialization at this point. He is making excellent progress and I'm very pleased in this regard. With that being said there is still definitely some work that needs to be done as far as getting this area to heal completely. I do believe the compression wrap is integral to keeping this moving in the proper direction. Electronic Signature(s) Signed: 12/03/2018 8:42:52 AM By: Lenda Kelp PA-C Entered By: Lenda Kelp on 12/03/2018 02:03:49 Johnathan Scott ([id number]) -------------------------------------------------------------------------------- Physician Orders Details Patient Name: Johnathan Scott Date of Service: 12/02/2018 2:30 PM Medical Record Number: [id number] Patient Account Number: [id number] Date of Birth/Sex: 01-04-1966 (53 y.o. M) Treating RN: Curtis Sites Primary Care Provider: Birdie Sons, ERIC Other Clinician: Referring Provider: Birdie Sons, ERIC Treating Provider/Extender: Linwood Dibbles, Lataisha Colan Weeks in Treatment: 16 Verbal / Phone Orders: No Diagnosis Coding Wound Cleansing Wound #1 Left,Anterior Lower Leg o Clean wound with Normal Saline. o May Shower, gently pat wound dry prior to applying  new dressing. Anesthetic (add to Medication List) Wound #1 Left,Anterior Lower Leg o Topical Lidocaine 4% cream applied to wound bed prior to debridement (In Clinic Only). Primary Wound Dressing Wound #1 Left,Anterior Lower Leg o Silver Alginate Secondary Dressing Wound #1 Left,Anterior Lower Leg o ABD pad Dressing Change Frequency Wound #1 Left,Anterior  Lower Leg o Change dressing every week Follow-up Appointments Wound #1 Left,Anterior Lower Leg o Return Appointment in 1 week. Edema Control Wound #1 Left,Anterior Lower Leg o 3 Layer Compression System - Left Lower Extremity o Elevate legs to the level of the heart and pump ankles as often as possible Electronic Signature(s) Signed: 12/02/2018 4:47:47 PM By: Curtis Sites Signed: 12/03/2018 8:42:52 AM By: Lenda Kelp PA-C Entered By: Curtis Sites on 12/02/2018 15:04:51 Johnathan Scott ([id number]) -------------------------------------------------------------------------------- Problem List Details Patient Name: Johnathan Scott Date of Service: 12/02/2018 2:30 PM Medical Record Number: [id number] Patient Account Number: [id number] Date of Birth/Sex: 1966/01/04 (53 y.o. M) Treating RN: Curtis Sites Primary Care Provider: Birdie Sons, ERIC Other Clinician: Referring Provider: Birdie Sons, ERIC Treating Provider/Extender: Linwood Dibbles, Adriana Quinby Weeks in Treatment: 16 Active Problems ICD-10 Evaluated Encounter Code Description Active Date Today Diagnosis I87.2 Venous insufficiency (chronic) (peripheral) 08/09/2018 No Yes L97.822 Non-pressure chronic ulcer of other part of left lower leg with 08/09/2018 No Yes fat layer exposed D50.9 Iron deficiency anemia, unspecified 08/09/2018 No Yes J45.998 Other asthma 08/09/2018 No Yes Inactive Problems Resolved Problems Electronic Signature(s) Signed: 12/03/2018 8:42:52 AM By: Lenda Kelp PA-C Entered By: Lenda Kelp on 12/03/2018 02:02:26 Johnathan Scott ([id number]) -------------------------------------------------------------------------------- Progress Note Details Patient Name: Johnathan Scott Date of Service: 12/02/2018 2:30 PM Medical Record Number: [id number] Patient Account Number: [id number] Date of Birth/Sex: 1965/12/20 (53 y.o. M) Treating RN: Curtis Sites Primary Care Provider: Birdie Sons, ERIC Other  Clinician: Referring Provider: Birdie Sons, ERIC Treating Provider/Extender: Linwood Dibbles, Billie Trager Weeks in Treatment: 16 Subjective Chief Complaint Information obtained from Patient Left Anterior LE Ulcer History of Present Illness (HPI) 08/09/18 on evaluation today patient presents for initial evaluation and clinic as referral concerning an ulcer that he has on the left anterior lower Trinity which has been present for five months. He states that he actually noticed this after having an "insect bite" while driving down the road. He states that he did not see what kind of insect it was but he felt it wrong up his leg and then have the bite. Since that time is been having issues with the fact that this area does not want to heal. He does have a history of asthma as well as anemia is most recent CBC revealed a hemoglobin of 11.1 with a hematocrit of 34.6. He does have a history of panic attacks as well intermittently. No fevers, chills, nausea, or vomiting noted at this time. Specifically the patient does not appear to have any infection in regard to the left lower extremity. He's not on the current antibiotics. 08/16/18 upon evaluation today patient actually appears to be doing significantly better in regard to his wound. He has been tolerating the dressing changes that he was performing on his own over the last week. Subsequently we did not wrap his leg at that time although we discussed it. We weren't sure that he was gonna be able to get his pants on top of the dressing. Nonetheless the patient fortunately came today with shorts on he states that he is willing to be wrapped if it will help the wound to heal faster. He is pleased with the fact that the wound  already appears to be doing better it's also not hurting as badly. The last debridement was a little bit more uncomfortable for him unfortunately. Nonetheless he states he's willing to allow me to debride it again today if I do so  carefully. 08/24/18 upon evaluation today patient actually appears to be doing rather well in regard to his lower Trinity ulcer on the left. He has been tolerating the dressing changes with Iodoflex including the wrap without complication and the wound [place] into making signs of good progress. Overall I'm very pleased with how things appear at this time. No fevers, chills, nausea, or vomiting noted at this time. 09/03/18 on evaluation today patient actually appears to be doing rather well in regard to his wound. In fact the overall measurement belies the fact that the wound is actually healed quite a bit more than what the measurement would suggest. In fact it's almost half the size that it was during the last evaluation unfortunately a lot of the area where new skin has grown over is in between two areas that had to be clustered together one of the distal portion of the wound has not completely healed up. Nonetheless there is actually quite a bit more healing than what seems to be represented by the wound measurement today. Overall I'm very pleased with the overall progress and how he seems to be doing with the dressing changes. The wrap seems to be of great benefit for him as well. 09/09/18 upon evaluation today patient actually appears to be doing excellent in regard to his left lotion the ulcer. He has been tolerating the dressing changes without complication. There does not appear to be any evidence of infection overall I think the [facility] Dressing has done excellent for him. I'm very pleased. 09/23/18 evaluation today patient actually appears to be doing poorly in regard to his lower extremity ulcer. There's a lot of maceration and skin breakdown around the actual wound itself which is making it difficult to even tell exactly what is going on. This is something he states started about four days ago. Nonetheless I'm concerned about the possibility of infection just being that he was not  having near this amount of drainage previous. He also states in the past 24 hours the pain has been a little bit more significant. No fevers, chills, nausea, or vomiting noted at this time. 09/30/18 on evaluation today patient actually appears to be doing poorly in regard to his lower extremity ulcer. He's been tolerating the dressing changes although there is a lot of tape irritation surrounding the wound area. Subsequently I do think [facility] [place] ([id number]) that he would benefit from [place] initiating treatment with all the compression wrap as we did before to avoid any additional injury to the area. No fevers, chills, nausea, or vomiting noted at this time. The patient did have a return of his wound culture which showed Pseudomonas which will not be covered by the Bactrim that I previously prescribed for him. With that being said the patient states that he did not know I was sitting in a prescription for him and therefore never picked this up he also states the pharmacy did not call him. Nonetheless this was something that was sent we will call the pharmacy and having counseling as I'm gonna have to send them something different. 10/08/18 on evaluation today patient's ulcer still appears to be much larger than what it was previous to the deterioration. With that being said the erythema surrounding the wound  bed is not nearly as significant I do believe the antibiotic has been of help for him. He did finally get the Levaquin which is good news. No fevers, chills, nausea, or vomiting noted at this time. 10/12/18 on evaluation today patient actually appears to be doing much better in regard to his lower extremity ulcer. Overall I do believe the dressing change has done well for him at this time. I'm pleased with the overall progress even since just in the last week. 10/19/18 Seen today for follow up and management of left lower leg wound. Wound looks to be improving today and measuring smaller.  Decreased surrounding erythema. No s/s of infection. Denies any increased pain, fever, or chills. 10/26/18 on evaluation today patient's wound actually appears to be doing very well. Unfortunately he has a new wound on the medial aspect of his lower extremity that is due to the wrap having slid down. He did not contact us as he did not know whether or not we be able to work him in. Nonetheless I told him in the future if this happens the definitely call us and let us know. 11/01/18 on evaluation today patient's lower Trinity ulcer appears to show signs of improvement compared to last week's evaluation. In fact the new area which blistered and arose last week appears to likely be completely healed at this time. Fortunately there is no sign of infection which is also good news. Overall I'm very pleased with how things seem to be progressing. 11/08/18 on evaluation today patient appears to be doing better in regard to his lower extremity ulcer. Fortunately there is no evidence of infection at this time. Overall he seems to be making good progress which is good news. 11/15/18 on evaluation today patient appears to be doing very well at this point in regard to left anterior lower Trinity ulcer. This is not completely healed but does seem to be progressing in an appropriate fashion at this time. Fortunately there is no sign of infection currently. No fevers, chills, nausea, or vomiting noted at this time. 11/26/2018 Seen today for follow-up and management of left lower leg anterior ulcer. Wound continues to be progressing well. There is no evidence of infection. He did express wanting to end wound care and just to apply a Band-Aid over the wound. Discussed healing process and the need for ongoing compliance with wound care. He denies any immediate concerns during visit. No recent fevers, chills, or shortness of breath. 12/02/18 on evaluation today patient actually appears to be doing very well in regard to his  lower extremity ulcer which is shown signs of good improvement. Fortunately there is no sign of infection at this time. Patient has been tolerating the dressing changes without complication. I'm very pleased with how things have been progressing. No fevers, chills, nausea, or vomiting noted at this time. Patient History Information obtained from Patient. Family History Cancer - Mother,Father,Siblings, Diabetes - Siblings, Heart Disease - Siblings, Hypertension - Siblings, No family history of Hereditary Spherocytosis, Kidney Disease, Lung Disease, Seizures, Stroke, Thyroid Problems, Tuberculosis. Social History Former smoker - 7 years, Marital Status - Single, Alcohol Use - Rarely, Drug Use - No History, Caffeine Use - Never. Medical History Eyes Denies history of Cataracts, Glaucoma, Optic Neuritis Johnathan SextonSHAMBLEY, Elwin ([id number]) Ear/Nose/Mouth/Throat Denies history of Chronic sinus problems/congestion, Middle ear problems Hematologic/Lymphatic Denies history of Anemia, Hemophilia, Human Immunodeficiency Virus, Lymphedema, Sickle Cell Disease Respiratory Patient has history of Asthma - history as a child Denies history of Aspiration, Chronic  Obstructive Pulmonary Disease (COPD), Pneumothorax, Sleep Apnea, Tuberculosis Cardiovascular Denies history of Angina, Arrhythmia, Congestive Heart Failure, Coronary Artery Disease, Deep Vein Thrombosis, Hypertension, Hypotension, Myocardial Infarction, Peripheral Arterial Disease, Peripheral Venous Disease, Phlebitis, Vasculitis Gastrointestinal Denies history of Cirrhosis , Colitis, Crohn s, Hepatitis A, Hepatitis B, Hepatitis C Endocrine Denies history of Type I Diabetes, Type II Diabetes Genitourinary Denies history of End Stage Renal Disease Immunological Denies history of Lupus Erythematosus, Raynaud s, Scleroderma Integumentary (Skin) Denies history of History of Burn, History of pressure wounds Musculoskeletal Patient has history of  Osteoarthritis - both hips Denies history of Gout, Rheumatoid Arthritis, Osteomyelitis Neurologic Patient has history of Seizure Disorder - as a child Denies history of Dementia, Neuropathy, Quadriplegia, Paraplegia Oncologic Denies history of Received Chemotherapy, Received Radiation Psychiatric Denies history of Anorexia/bulimia, Confinement Anxiety Review of Systems (ROS) Constitutional Symptoms (General Health) Denies complaints or symptoms of Fever, Chills. Respiratory The patient has no complaints or symptoms. Cardiovascular Complains or has symptoms of LE edema. Psychiatric The patient has no complaints or symptoms. Objective Constitutional Well-nourished and well-hydrated in no acute distress. Vitals Time Taken: 2:20 PM, Height: 61 in, Weight: 232 lbs, BMI: 43.8, Temperature: 97.6 F, Pulse: 63 bpm, Respiratory Rate: 16 breaths/min, Blood Pressure: 152/71 mmHg. [place], Jessie ([id number]) Respiratory normal breathing without difficulty. clear to auscultation bilaterally. Cardiovascular regular rate and rhythm with normal S1, S2. Psychiatric this patient is able to make decisions and demonstrates good insight into disease process. Alert and Oriented x 3. pleasant and cooperative. General Notes: Patient's wound bed currently shows signs of good granulation and epithelialization at this point. He is making excellent progress and I'm very pleased in this regard. With that being said there is still definitely some work that needs to be done as far as getting this area to heal completely. I do believe the compression wrap is integral to keeping this moving in the proper direction. Integumentary (Hair, Skin) Wound #1 status is Open. Original cause of wound was Gradually Appeared. The wound is located on the Left,Anterior Lower Leg. The wound measures 0.7cm length x 0.5cm width x 0.1cm depth; 0.275cm^2 area and 0.027cm^3 volume. There is Fat Layer (Subcutaneous Tissue) Exposed  exposed. There is no tunneling or undermining noted. There is a small amount of serous drainage noted. The wound margin is flat and intact. There is no granulation within the wound bed. There is a medium (34-66%) amount of necrotic tissue within the wound bed including Eschar and Adherent Slough. The periwound skin appearance exhibited: Dry/Scaly, Hemosiderin Staining. The periwound skin appearance did not exhibit: Callus, Crepitus, Excoriation, Induration, Rash, Scarring, Maceration, Atrophie Blanche, Cyanosis, Ecchymosis, Mottled, Pallor, Rubor, Erythema. Periwound temperature was noted as No Abnormality. Assessment Active Problems ICD-10 Venous insufficiency (chronic) (peripheral) Non-pressure chronic ulcer of other part of left lower leg with fat layer exposed Iron deficiency anemia, unspecified Other asthma Procedures Wound #1 Pre-procedure diagnosis of Wound #1 is a Venous Leg Ulcer located on the Left,Anterior Lower Leg . There was a Three Layer Compression Therapy Procedure with a pre-treatment ABI of 1.2 by Curtis Sites, RN. Post procedure Diagnosis Wound #1: Same as Pre-Procedure Plan ASUNCION, SHIBATA ([id number]) Wound Cleansing: Wound #1 Left,Anterior Lower Leg: Clean wound with Normal Saline. May Shower, gently pat wound dry prior to applying new dressing. Anesthetic (add to Medication List): Wound #1 Left,Anterior Lower Leg: Topical Lidocaine 4% cream applied to wound bed prior to debridement (In Clinic Only). Primary Wound Dressing: Wound #1 Left,Anterior Lower Leg: Silver Alginate Secondary Dressing: Wound #1  Left,Anterior Lower Leg: ABD pad Dressing Change Frequency: Wound #1 Left,Anterior Lower Leg: Change dressing every week Follow-up Appointments: Wound #1 Left,Anterior Lower Leg: Return Appointment in 1 week. Edema Control: Wound #1 Left,Anterior Lower Leg: 3 Layer Compression System - Left Lower Extremity Elevate legs to the level of the heart and pump  ankles as often as possible My suggestion at this point is gonna be that we go ahead and continue with the above wound care measures for the next week. Patient is in agreement that plan. We will subsequently see were things stand at follow-up. If anything changes or worsens the meantime let me know. Please see above for specific wound care orders. We will see patient for re-evaluation in 1 week(s) here in the clinic. If anything worsens or changes patient will contact our office for additional recommendations. Electronic Signature(s) Signed: 12/03/2018 8:42:52 AM By: Lenda Kelp PA-C Entered By: Lenda Kelp on 12/03/2018 02:04:11 Johnathan Scott ([id number]) -------------------------------------------------------------------------------- ROS/PFSH Details Patient Name: Johnathan Scott Date of Service: 12/02/2018 2:30 PM Medical Record Number: [id number] Patient Account Number: [id number] Date of Birth/Sex: 06/02/1966 (53 y.o. M) Treating RN: Curtis Sites Primary Care Provider: Birdie Sons, ERIC Other Clinician: Referring Provider: Birdie Sons, ERIC Treating Provider/Extender: Linwood Dibbles, Joy Haegele Weeks in Treatment: 16 Information Obtained From Patient Wound History Do you currently have one or more open woundso Yes How many open wounds do you currently haveo 1 Approximately how long have you had your woundso 5 months How have you been treating your wound(s) until nowo open to air Has your wound(s) ever healed and then re-openedo No Have you had any lab work done in the past montho No Have you tested positive for an antibiotic resistant organism (MRSA, VRE)o No Have you tested positive for osteomyelitis (bone infection)o No Have you had any tests for circulation on your legso No Have you had other problems associated with your woundso Swelling Constitutional Symptoms (General Health) Complaints and Symptoms: Negative for: Fever; Chills Cardiovascular Complaints and  Symptoms: Positive for: LE edema Medical History: Negative for: Angina; Arrhythmia; Congestive Heart Failure; Coronary Artery Disease; Deep Vein Thrombosis; Hypertension; Hypotension; Myocardial Infarction; Peripheral Arterial Disease; Peripheral Venous Disease; Phlebitis; Vasculitis Eyes Medical History: Negative for: Cataracts; Glaucoma; Optic Neuritis Ear/Nose/Mouth/Throat Medical History: Negative for: Chronic sinus problems/congestion; Middle ear problems Hematologic/Lymphatic Medical History: Negative for: Anemia; Hemophilia; Human Immunodeficiency Virus; Lymphedema; Sickle Cell Disease Respiratory Complaints and Symptoms: No Complaints or Symptoms CURLEE, BOGAN ([id number]) Medical History: Positive for: Asthma - history as a child Negative for: Aspiration; Chronic Obstructive Pulmonary Disease (COPD); Pneumothorax; Sleep Apnea; Tuberculosis Gastrointestinal Medical History: Negative for: Cirrhosis ; Colitis; Crohnos; Hepatitis A; Hepatitis B; Hepatitis C Endocrine Medical History: Negative for: Type I Diabetes; Type II Diabetes Genitourinary Medical History: Negative for: End Stage Renal Disease Immunological Medical History: Negative for: Lupus Erythematosus; Raynaudos; Scleroderma Integumentary (Skin) Medical History: Negative for: History of Burn; History of pressure wounds Musculoskeletal Medical History: Positive for: Osteoarthritis - both hips Negative for: Gout; Rheumatoid Arthritis; Osteomyelitis Neurologic Medical History: Positive for: Seizure Disorder - as a child Negative for: Dementia; Neuropathy; Quadriplegia; Paraplegia Oncologic Medical History: Negative for: Received Chemotherapy; Received Radiation Psychiatric Complaints and Symptoms: No Complaints or Symptoms Medical History: Negative for: Anorexia/bulimia; Confinement Anxiety Immunizations Pneumococcal Vaccine: Received Pneumococcal Vaccination: No Implantable Devices LUKEN, SHADOWENS ([id number]) Family and Social History Cancer: Yes - Mother,Father,Siblings; Diabetes: Yes - Siblings; Heart Disease: Yes - Siblings; Hereditary Spherocytosis: No; Hypertension: Yes - Siblings; Kidney Disease: No; Lung Disease: No; Seizures: No; Stroke:  No; Thyroid Problems: No; Tuberculosis: No; Former smoker - 7 years; Marital Status - Single; Alcohol Use: Rarely; Drug Use: No History; Caffeine Use: Never; Financial Concerns: No; Food, Clothing or Shelter Needs: No; Support System Lacking: No; Transportation Concerns: No; Advanced Directives: No; Patient does not want information on Advanced Directives Physician Affirmation I have reviewed and agree with the above information. Electronic Signature(s) Signed: 12/03/2018 8:42:52 AM By: Lenda Kelp PA-C Signed: 12/03/2018 4:52:28 PM By: Curtis Sites Entered By: Lenda Kelp on 12/03/2018 02:03:37 Johnathan Scott ([id number]) -------------------------------------------------------------------------------- SuperBill Details Patient Name: Johnathan Scott Date of Service: 12/02/2018 Medical Record Number: [id number] Patient Account Number: [id number] Date of Birth/Sex: 08-02-1966 (53 y.o. M) Treating RN: Curtis Sites Primary Care Provider: Birdie Sons, ERIC Other Clinician: Referring Provider: Birdie Sons, ERIC Treating Provider/Extender: Linwood Dibbles, Jayelle Page Weeks in Treatment: 16 Diagnosis Coding ICD-10 Codes Code Description I87.2 Venous insufficiency (chronic) (peripheral) L97.822 Non-pressure chronic ulcer of other part of left lower leg with fat layer exposed D50.9 Iron deficiency anemia, unspecified J45.998 Other asthma Facility Procedures CPT4 Code: 72536644 Description: (Facility Use Only) [id number] - APPLY MULTLAY COMPRS LWR LT LEG Modifier: Quantity: 1 Physician Procedures CPT4 Code Description: 9563875 99214 - WC PHYS LEVEL 4 - EST PT ICD-10 Diagnosis Description I87.2 Venous insufficiency (chronic) (peripheral)  L97.822 Non-pressure chronic ulcer of other part of left lower leg wit D50.9 Iron deficiency anemia,  unspecified J45.998 Other asthma Modifier: h fat layer expos Quantity: 1 ed Electronic Signature(s) Signed: 12/03/2018 8:42:52 AM By: Lenda Kelp PA-C Previous Signature: 12/02/2018 4:47:47 PM Version By: Curtis Sites Entered By: Lenda Kelp on 12/03/2018 64:33:29

## 2018-12-06 NOTE — Progress Notes (Signed)
HAKEIM, MUSE ([id number]) Visit Report for 12/02/2018 Arrival Information Details Patient Name: Johnathan Scott, Johnathan Scott Date of Service: 12/02/2018 2:30 PM Medical Record Number: [id number] Patient Account Number: [id number] Date of Birth/Sex: March 20, 1966 (53 y.o. M) Treating RN: Rodell Perna Primary Care Wyatt Galvan: Birdie Sons, ERIC Other Clinician: Referring Jeanae Whitmill: Birdie Sons, ERIC Treating Camri Molloy/Extender: Linwood Dibbles, HOYT Weeks in Treatment: 16 Visit Information History Since Last Visit Added or deleted any medications: Yes Patient Arrived: Ambulatory Any new allergies or adverse reactions: No Arrival Time: 14:38 Had a fall or experienced change in No Accompanied By: self activities of daily living that may affect Transfer Assistance: None risk of falls: Patient Identification Verified: Yes Signs or symptoms of abuse/neglect since last visito No Patient Has Alerts: Yes Hospitalized since last visit: No Patient Alerts: PT REFUSED FACE PHOTO Pain Present Now: No Electronic Signature(s) Signed: 12/06/2018 8:46:05 AM By: Rodell Perna Entered By: Rodell Perna on 12/02/2018 14:39:52 Johnathan Scott ([id number]) -------------------------------------------------------------------------------- Compression Therapy Details Patient Name: Johnathan Scott Date of Service: 12/02/2018 2:30 PM Medical Record Number: [id number] Patient Account Number: [id number] Date of Birth/Sex: [birth year] (53 y.o. M) Treating RN: Curtis Sites Primary Care Briggett Tuccillo: Birdie Sons, ERIC Other Clinician: Referring Oluwadamilola Rosamond: Birdie Sons, ERIC Treating Basil Blakesley/Extender: Linwood Dibbles, HOYT Weeks in Treatment: 16 Compression Therapy Performed for Wound Assessment: Wound #1 Left,Anterior Lower Leg Performed By: Clinician Curtis Sites, RN Compression Type: Three Layer Pre Treatment ABI: 1.2 Post Procedure Diagnosis Same as Pre-procedure Electronic Signature(s) Signed: 12/02/2018 4:47:47 PM By: Curtis Sites Entered By: Curtis Sites on 12/02/2018 15:09:13 Johnathan Scott ([id number]) -------------------------------------------------------------------------------- Encounter Discharge Information Details Patient Name: Johnathan Scott Date of Service: 12/02/2018 2:30 PM Medical Record Number: [id number] Patient Account Number: [id number] Date of Birth/Sex: [birth year] (53 y.o. M) Treating RN: Curtis Sites Primary Care Idonia Zollinger: Birdie Sons, ERIC Other Clinician: Referring Ellinore Merced: Birdie Sons, ERIC Treating Makara Lanzo/Extender: Linwood Dibbles, HOYT Weeks in Treatment: 16 Encounter Discharge Information Items Discharge Condition: Stable Ambulatory Status: Cane Discharge Destination: Home Transportation: Private Auto Accompanied By: self Schedule Follow-up Appointment: Yes Clinical Summary of Care: Electronic Signature(s) Signed: 12/02/2018 4:47:47 PM By: Curtis Sites Entered By: Curtis Sites on 12/02/2018 15:09:50 Johnathan Scott ([id number]) -------------------------------------------------------------------------------- Lower Extremity Assessment Details Patient Name: Johnathan Scott Date of Service: 12/02/2018 2:30 PM Medical Record Number: [id number] Patient Account Number: [id number] Date of Birth/Sex: Feb 02, 1966 (53 y.o. M) Treating RN: Rodell Perna Primary Care Renatta Shrieves: Birdie Sons, ERIC Other Clinician: Referring Symphani Eckstrom: Birdie Sons, ERIC Treating Aalaiyah Yassin/Extender: Linwood Dibbles, HOYT Weeks in Treatment: 16 Edema Assessment Assessed: [Left: No] [Right: No] [Left: Edema] [Right: :] Calf Left: Right: Point of Measurement: 29 cm From Medial Instep 49 cm cm Ankle Left: Right: Point of Measurement: 12 cm From Medial Instep 30 cm cm Vascular Assessment Pulses: Posterior Tibial Extremity colors, hair growth, and conditions: Extremity Color: [Left:Hyperpigmented] Hair Growth on Extremity: [Left:No] Temperature of Extremity: [Left:Warm] Capillary Refill: [Left:< 3  seconds] Toe Nail Assessment Left: Right: Thick: Yes Discolored: Yes Deformed: No Improper Length and Hygiene: No Electronic Signature(s) Signed: 12/06/2018 8:46:05 AM By: Rodell Perna Entered By: Rodell Perna on 12/02/2018 14:51:04 Johnathan Scott ([id number]) -------------------------------------------------------------------------------- Multi Wound Chart Details Patient Name: Johnathan Scott Date of Service: 12/02/2018 2:30 PM Medical Record Number: [id number] Patient Account Number: [id number] Date of Birth/Sex: 12/25/1965 (53 y.o. M) Treating RN: Curtis Sites Primary Care Angeletta Goelz: Birdie Sons, ERIC Other Clinician: Referring Jackie Russman: Birdie Sons, ERIC Treating Trystin Terhune/Extender: Linwood Dibbles, HOYT Weeks in Treatment: 16 Vital Signs Height(in): 61 Pulse(bpm): 63 Weight(lbs): 232 Blood Pressure(mmHg): 152/71 Body Mass Index(BMI): 44 Temperature(F): 97.6 Respiratory Rate 16 (breaths/min): Photos: [1:No Photos] [N/A:N/A] Wound  Location: [1:Left Lower Leg - Anterior] [N/A:N/A] Wounding Event: [1:Gradually Appeared] [N/A:N/A] Primary Etiology: [1:Venous Leg Ulcer] [N/A:N/A] Comorbid History: [1:Asthma, Osteoarthritis, Seizure Disorder] [N/A:N/A] Date Acquired: [1:03/08/2018] [N/A:N/A] Weeks of Treatment: [1:16] [N/A:N/A] Wound Status: [1:Open] [N/A:N/A] Measurements L x W x D [1:0.7x0.5x0.1] [N/A:N/A] (cm) Area (cm) : [1:0.275] [N/A:N/A] Volume (cm) : [1:0.027] [N/A:N/A] % Reduction in Area: [1:97.20%] [N/A:N/A] % Reduction in Volume: [1:98.60%] [N/A:N/A] Classification: [1:Full Thickness Without Exposed Support Structures] [N/A:N/A] Exudate Amount: [1:Small] [N/A:N/A] Exudate Type: [1:Serous] [N/A:N/A] Exudate Color: [1:amber] [N/A:N/A] Wound Margin: [1:Flat and Intact] [N/A:N/A] Granulation Amount: [1:None Present (0%)] [N/A:N/A] Necrotic Amount: [1:Medium (34-66%)] [N/A:N/A] Necrotic Tissue: [1:Eschar, Adherent Slough] [N/A:N/A] Exposed Structures: [1:Fat  Layer (Subcutaneous Tissue) Exposed: Yes Fascia: No Tendon: No Muscle: No Joint: No Bone: No] [N/A:N/A] Epithelialization: [1:Small (1-33%)] [N/A:N/A] Periwound Skin Texture: [1:Excoriation: No Induration: No Callus: No Crepitus: No] [N/A:N/A] Rash: No Scarring: No Periwound Skin Moisture: Dry/Scaly: Yes N/A N/A Maceration: No Periwound Skin Color: Hemosiderin Staining: Yes N/A N/A Atrophie Blanche: No Cyanosis: No Ecchymosis: No Erythema: No Mottled: No Pallor: No Rubor: No Temperature: No Abnormality N/A N/A Tenderness on Palpation: No N/A N/A Wound Preparation: Ulcer Cleansing: Other: soap N/A N/A and water Topical Anesthetic Applied: Other: lidocaine 4% Treatment Notes Electronic Signature(s) Signed: 12/02/2018 4:47:47 PM By: Curtis Sites Entered By: Curtis Sites on 12/02/2018 15:03:18 Johnathan Scott ([id number]) -------------------------------------------------------------------------------- Multi-Disciplinary Care Plan Details Patient Name: Johnathan Scott Date of Service: 12/02/2018 2:30 PM Medical Record Number: [id number] Patient Account Number: [id number] Date of Birth/Sex: Jun 23, 1966 (53 y.o. M) Treating RN: Curtis Sites Primary Care Kenley Rettinger: Birdie Sons, ERIC Other Clinician: Referring Rendi Mapel: Birdie Sons, ERIC Treating Dois Juarbe/Extender: Linwood Dibbles, HOYT Weeks in Treatment: 16 Active Inactive Necrotic Tissue Nursing Diagnoses: Knowledge deficit related to management of necrotic/devitalized tissue Goals: Necrotic/devitalized tissue will be minimized in the wound bed Date Initiated: 08/09/2018 Target Resolution Date: 09/09/2018 Goal Status: Active Interventions: Assess patient pain level pre-, during and post procedure and prior to discharge Treatment Activities: Apply topical anesthetic as ordered : 08/09/2018 Excisional debridement : 08/09/2018 Notes: Orientation to the Wound Care Program Nursing Diagnoses: Knowledge deficit related to the  wound healing center program Goals: Patient/caregiver will verbalize understanding of the Wound Healing Center Program Date Initiated: 08/09/2018 Target Resolution Date: 09/09/2018 Goal Status: Active Interventions: Provide education on orientation to the wound center Notes: Wound/Skin Impairment Nursing Diagnoses: Impaired tissue integrity Knowledge deficit related to smoking impact on wound healing Goals: Ulcer/skin breakdown will have a volume reduction of 30% by week 4 CHISHOLM, CONARY ([id number]) Date Initiated: 08/09/2018 Target Resolution Date: 09/09/2018 Goal Status: Active Interventions: Assess patient/caregiver ability to obtain necessary supplies Assess ulceration(s) every visit Treatment Activities: Topical wound management initiated : 08/09/2018 Notes: Electronic Signature(s) Signed: 12/02/2018 4:47:47 PM By: Curtis Sites Entered By: Curtis Sites on 12/02/2018 15:03:09 Johnathan Scott ([id number]) -------------------------------------------------------------------------------- Pain Assessment Details Patient Name: Johnathan Scott Date of Service: 12/02/2018 2:30 PM Medical Record Number: [id number] Patient Account Number: [id number] Date of Birth/Sex: 17-Jul-1966 (53 y.o. M) Treating RN: Rodell Perna Primary Care Jasani Dolney: Birdie Sons ERIC Other Clinician: Referring Wai Litt: Birdie Sons, ERIC Treating Marly Schuld/Extender: Linwood Dibbles, HOYT Weeks in Treatment: 16 Active Problems Location of Pain Severity and Description of Pain Patient Has Paino No Site Locations Rate the pain. Current Pain Level: 0 Pain Management and Medication Current Pain Management: Electronic Signature(s) Signed: 12/06/2018 8:46:05 AM By: Rodell Perna Entered By: Rodell Perna on 12/02/2018 14:40:02 Johnathan Scott ([id number]) -------------------------------------------------------------------------------- Patient/Caregiver Education Details Patient Name: Johnathan Scott Date of  Service: 12/02/2018 2:30 PM Medical Record Number: [id number]  Patient Account Number: [id number] Date of Birth/Gender: May 25, 1966 (53 y.o. M) Treating RN: Curtis Sitesorthy, Joanna Primary Care Physician: Birdie SonsSONNENBERG, ERIC Other Clinician: Referring Physician: Birdie SonsSONNENBERG, ERIC Treating Physician/Extender: Skeet SimmerSTONE III, HOYT Weeks in Treatment: 16 Education Assessment Education Provided To: Patient Education Topics Provided Venous: Handouts: Other: leg elevation Methods: Explain/Verbal Responses: State content correctly Electronic Signature(s) Signed: 12/02/2018 4:47:47 PM By: Curtis Sitesorthy, Joanna Entered By: Curtis Sitesorthy, Joanna on 12/02/2018 15:03:38 Johnathan SextonSHAMBLEY, Aakash ([id number]) -------------------------------------------------------------------------------- Wound Assessment Details Patient Name: Johnathan SextonSHAMBLEY, Harwood Date of Service: 12/02/2018 2:30 PM Medical Record Number: [id number] Patient Account Number: [id number] Date of Birth/Sex: May 25, 1966 (53 y.o. M) Treating RN: Rodell PernaScott, Dajea Primary Care Eddrick Dilone: Birdie SonsSONNENBERG, ERIC Other Clinician: Referring Wilkins Elpers: Birdie SonsSONNENBERG, ERIC Treating Shan Valdes/Extender: Linwood DibblesSTONE III, HOYT Weeks in Treatment: 16 Wound Status Wound Number: 1 Primary Etiology: Venous Leg Ulcer Wound Location: Left Lower Leg - Anterior Wound Status: Open Wounding Event: Gradually Appeared Comorbid History: Asthma, Osteoarthritis, Seizure Disorder Date Acquired: 03/08/2018 Weeks Of Treatment: 16 Clustered Wound: No Photos Photo Uploaded By: Elliot GurneyWoody, BSN, RN, CWS, Kim on 12/03/2018 15:41:03 Wound Measurements Length: (cm) 0.7 Width: (cm) 0.5 Depth: (cm) 0.1 Area: (cm) 0.275 Volume: (cm) 0.027 % Reduction in Area: 97.2% % Reduction in Volume: 98.6% Epithelialization: Small (1-33%) Tunneling: No Undermining: No Wound Description Full Thickness Without Exposed Support Foul Odo Classification: Structures Slough/F Wound Margin: Flat and Intact Exudate Small Amount: Exudate Type:  Serous Exudate Color: amber r After Cleansing: No ibrino Yes Wound Bed Granulation Amount: None Present (0%) Exposed Structure Necrotic Amount: Medium (34-66%) Fascia Exposed: No Necrotic Quality: Eschar, Adherent Slough Fat Layer (Subcutaneous Tissue) Exposed: Yes Tendon Exposed: No Muscle Exposed: No Joint Exposed: No Bone Exposed: No Strough, Raunel ([id number]) Periwound Skin Texture Texture Color No Abnormalities Noted: No No Abnormalities Noted: No Callus: No Atrophie Blanche: No Crepitus: No Cyanosis: No Excoriation: No Ecchymosis: No Induration: No Erythema: No Rash: No Hemosiderin Staining: Yes Scarring: No Mottled: No Pallor: No Moisture Rubor: No No Abnormalities Noted: No Dry / Scaly: Yes Temperature / Pain Maceration: No Temperature: No Abnormality Wound Preparation Ulcer Cleansing: Other: soap and water, Topical Anesthetic Applied: Other: lidocaine 4%, Treatment Notes Wound #1 (Left, Anterior Lower Leg) Notes silvercel, abd, 3 layer wrap with unna to anchor Electronic Signature(s) Signed: 12/06/2018 8:46:05 AM By: Rodell PernaScott, Dajea Entered By: Rodell PernaScott, Dajea on 12/02/2018 14:49:30 Johnathan SextonSHAMBLEY, Elon ([id number]) -------------------------------------------------------------------------------- Vitals Details Patient Name: Johnathan SextonSHAMBLEY, Kashten Date of Service: 12/02/2018 2:30 PM Medical Record Number: [id number] Patient Account Number: [id number] Date of Birth/Sex: May 25, 1966 (53 y.o. M) Treating RN: Rodell PernaScott, Dajea Primary Care Dot Splinter: Birdie SonsSONNENBERG, ERIC Other Clinician: Referring Fabricio Endsley: Birdie SonsSONNENBERG, ERIC Treating Breannah Kratt/Extender: Linwood DibblesSTONE III, HOYT Weeks in Treatment: 16 Vital Signs Time Taken: 14:20 Temperature (F): 97.6 Height (in): 61 Pulse (bpm): 63 Weight (lbs): 232 Respiratory Rate (breaths/min): 16 Body Mass Index (BMI): 43.8 Blood Pressure (mmHg): 152/71 Reference Range: 80 - 120 mg / dl Airway Electronic Signature(s) Signed: 12/06/2018  8:46:05 AM By: Rodell PernaScott, Dajea Entered By: Rodell PernaScott, Dajea on 12/02/2018 14:40:26

## 2018-12-08 ENCOUNTER — Telehealth: Payer: Self-pay

## 2018-12-08 NOTE — Telephone Encounter (Signed)
Copied from CRM [id number]. Topic: General - Other >> Dec 08, 2018  9:10 AM Herby Abraham C wrote: Reason for CRM: pt is scheduled a 4 month F/U with PCP on Friday 12/10/18. Pt would like to know why is the apt needed? Pt says that his last ov he was prescribed a new medication but says that he got sick and stop taking it. Pt would like to be advised further.

## 2018-12-08 NOTE — Telephone Encounter (Signed)
Pt was last seen 11/22/2018 for HTN   OV before that date was 08/09/2028 for HTN   Does pt need this F/U appt or no?

## 2018-12-08 NOTE — Telephone Encounter (Signed)
I believe this upcoming visit was to follow-up on his blood pressure.  Please confirm that he is no longer taking lisinopril.  He would benefit from medication for his blood pressure and if he could not tolerate the lisinopril will need to consider an alternative medication.  Thanks.

## 2018-12-09 ENCOUNTER — Encounter: Payer: 59 | Admitting: Physician Assistant

## 2018-12-09 DIAGNOSIS — I872 Venous insufficiency (chronic) (peripheral): Secondary | ICD-10-CM | POA: Diagnosis not present

## 2018-12-09 DIAGNOSIS — E11622 Type 2 diabetes mellitus with other skin ulcer: Secondary | ICD-10-CM | POA: Diagnosis not present

## 2018-12-09 DIAGNOSIS — L97822 Non-pressure chronic ulcer of other part of left lower leg with fat layer exposed: Secondary | ICD-10-CM | POA: Diagnosis not present

## 2018-12-09 NOTE — Telephone Encounter (Signed)
Called and spoke with patient. Pt has not been taking the lisinopril the past 5 days due to being sick. Pt advised to keep appt. Sent to PCP. FYI

## 2018-12-09 NOTE — Progress Notes (Signed)
Johnathan SextonSHAMBLEY, Cuong ([id number]) Visit Report for 11/15/2018 Chief Complaint Document Details Patient Name: Johnathan SextonSHAMBLEY, Rawleigh Date of Service: 11/15/2018 2:00 PM Medical Record Number: [id number] Patient Account Number: [id number] Date of Birth/Sex: [birth year] (53 y.o. M) Treating RN: Huel CoventryWoody, Kim Primary Care Provider: Birdie SonsSONNENBERG, ERIC Other Clinician: Referring Provider: Birdie SonsSONNENBERG, ERIC Treating Provider/Extender: Lenda KelpSTONE III, HOYT Weeks in Treatment: 14 Information Obtained from: Patient Chief Complaint Left Anterior LE Ulcer Electronic Signature(s) Signed: 11/19/2018 9:05:33 PM By: Lenda KelpStone III, Hoyt PA-C Entered By: Lenda KelpStone III, Hoyt on 11/15/2018 14:12:20 Johnathan SextonSHAMBLEY, Johnathan Scott ([id number]) -------------------------------------------------------------------------------- HPI Details Patient Name: Johnathan SextonSHAMBLEY, Johnathan Scott Date of Service: 11/15/2018 2:00 PM Medical Record Number: [id number] Patient Account Number: [id number] Date of Birth/Sex: [birth year] (53 y.o. M) Treating RN: Huel CoventryWoody, Kim Primary Care Provider: Birdie SonsSONNENBERG, ERIC Other Clinician: Referring Provider: Birdie SonsSONNENBERG, ERIC Treating Provider/Extender: Linwood DibblesSTONE III, HOYT Weeks in Treatment: 14 History of Present Illness HPI Description: 08/09/18 on evaluation today patient presents for initial evaluation and clinic as referral concerning an ulcer that he has on the left anterior lower Trinity which has been present for five months. He states that he actually noticed this after having an "insect bite" while driving down the road. He states that he did not see what kind of insect it was but he felt it wrong up his leg and then have the bite. Since that time is been having issues with the fact that this area does not want to heal. He does have a history of asthma as well as anemia is most recent CBC revealed a hemoglobin of 11.1 with a hematocrit of 34.6. He does have a history of panic attacks as well intermittently. No fevers, chills, nausea, or vomiting  noted at this time. Specifically the patient does not appear to have any infection in regard to the left lower extremity. He's not on the current antibiotics. 08/16/18 upon evaluation today patient actually appears to be doing significantly better in regard to his wound. He has been tolerating the dressing changes that he was performing on his own over the last week. Subsequently we did not wrap his leg at that time although we discussed it. We weren't sure that he was gonna be able to get his pants on top of the dressing. Nonetheless the patient fortunately came today with shorts on he states that he is willing to be wrapped if it will help the wound to heal faster. He is pleased with the fact that the wound already appears to be doing better it's also not hurting as badly. The last debridement was a little bit more uncomfortable for him unfortunately. Nonetheless he states he's willing to allow me to debride it again today if I do so carefully. 08/24/18 upon evaluation today patient actually appears to be doing rather well in regard to his lower Trinity ulcer on the left. He has been tolerating the dressing changes with Iodoflex including the wrap without complication and the wound us into making signs of good progress. Overall I'm very pleased with how things appear at this time. No fevers, chills, nausea, or vomiting noted at this time. 09/03/18 on evaluation today patient actually appears to be doing rather well in regard to his wound. In fact the overall measurement belies the fact that the wound is actually healed quite a bit more than what the measurement would suggest. In fact it's almost half the size that it was during the last evaluation unfortunately a lot of the area where new skin has grown over is in between two areas that had to  be clustered together one of the distal portion of the wound has not completely healed up. Nonetheless there is actually quite a bit more healing than what  seems to be represented by the wound measurement today. Overall I'm very pleased with the overall progress and how he seems to be doing with the dressing changes. The wrap seems to be of great benefit for him as well. 09/09/18 upon evaluation today patient actually appears to be doing excellent in regard to his left lotion the ulcer. He has been tolerating the dressing changes without complication. There does not appear to be any evidence of infection overall I think the [facility] Dressing has done excellent for him. I'm very pleased. 09/23/18 evaluation today patient actually appears to be doing poorly in regard to his lower extremity ulcer. There's a lot of maceration and skin breakdown around the actual wound itself which is making it difficult to even tell exactly what is going on. This is something he states started about four days ago. Nonetheless I'm concerned about the possibility of infection just being that he was not having near this amount of drainage previous. He also states in the past 24 hours the pain has been a little bit more significant. No fevers, chills, nausea, or vomiting noted at this time. 09/30/18 on evaluation today patient actually appears to be doing poorly in regard to his lower extremity ulcer. He's been tolerating the dressing changes although there is a lot of tape irritation surrounding the wound area. Subsequently I do think that he would benefit from [place] initiating treatment with all the compression wrap as we did before to avoid any additional injury to the area. No fevers, chills, nausea, or vomiting noted at this time. The patient did have a return of his wound culture which showed Pseudomonas which will not be covered by the Bactrim that I previously prescribed for him. With that being said the patient states that he did not know I was sitting in a prescription for him and therefore never picked this up he also states the pharmacy did not call him.  Nonetheless this was something that was sent we will call the pharmacy and having counseling as [name], [name] ([id number]) I'm gonna have to send them something different. 10/08/18 on evaluation today patient's ulcer still appears to be much larger than what it was previous to the deterioration. With that being said the erythema surrounding the wound bed is not nearly as significant I do believe the antibiotic has been of help for him. He did finally get the Levaquin which is good news. No fevers, chills, nausea, or vomiting noted at this time. 10/12/18 on evaluation today patient actually appears to be doing much better in regard to his lower extremity ulcer. Overall I do believe the dressing change has done well for him at this time. I'm pleased with the overall progress even since just in the last week. 10/19/18 Seen today for follow up and management of left lower leg wound. Wound looks to be improving today and measuring smaller. Decreased surrounding erythema. No s/s of infection. Denies any increased pain, fever, or chills. 10/26/18 on evaluation today patient's wound actually appears to be doing very well. Unfortunately he has a new wound on the medial aspect of his lower extremity that is due to the wrap having slid down. He did not contact us as he did not know whether or not we be able to work him in. Nonetheless I told him in the future if  this happens the definitely call us and let us know. 11/01/18 on evaluation today patient's lower Trinity ulcer appears to show signs of improvement compared to last week's evaluation. In fact the new area which blistered and arose last week appears to likely be completely healed at this time. Fortunately there is no sign of infection which is also good news. Overall I'm very pleased with how things seem to be progressing. 11/08/18 on evaluation today patient appears to be doing better in regard to his lower extremity ulcer. Fortunately there is  no evidence of infection at this time. Overall he seems to be making good progress which is good news. 11/15/18 on evaluation today patient appears to be doing very well at this point in regard to left anterior lower Trinity ulcer. This is not completely healed but does seem to be progressing in an appropriate fashion at this time. Fortunately there is no sign of infection currently. No fevers, chills, nausea, or vomiting noted at this time. Electronic Signature(s) Signed: 11/19/2018 9:05:33 PM By: Lenda Kelp PA-C Entered By: Lenda Kelp on 11/19/2018 20:31:15 Johnathan Scott ([id number]) -------------------------------------------------------------------------------- Physical Exam Details Patient Name: Johnathan Scott Date of Service: 11/15/2018 2:00 PM Medical Record Number: [id number] Patient Account Number: [id number] Date of Birth/Sex: 14-Oct-1966 (53 y.o. M) Treating RN: Huel Coventry Primary Care Provider: Birdie Sons, ERIC Other Clinician: Referring Provider: Birdie Sons, ERIC Treating Provider/Extender: STONE III, HOYT Weeks in Treatment: 14 Constitutional Well-nourished and well-hydrated in no acute distress. Respiratory normal breathing without difficulty. clear to auscultation bilaterally. Cardiovascular regular rate and rhythm with normal S1, S2. 1+ pitting edema of the bilateral lower extremities. Psychiatric this patient is able to make decisions and demonstrates good insight into disease process. Alert and Oriented x 3. pleasant and cooperative. Notes Patient's wound bed currently again did have some Slough noted on the surface of the wound which was cleaned away today with saline and gauze. No sharp debridement was required at this point. His swelling seems to be very well controlled with the compression wrap although he is tired of where this and wants to know if potentially we could discontinue it. Electronic Signature(s) Signed: 11/19/2018 9:05:33 PM By: Lenda Kelp PA-C Entered By: Lenda Kelp on 11/19/2018 20:32:00 Johnathan Scott ([id number]) -------------------------------------------------------------------------------- Physician Orders Details Patient Name: Johnathan Scott Date of Service: 11/15/2018 2:00 PM Medical Record Number: [id number] Patient Account Number: [id number] Date of Birth/Sex: [birth year] (53 y.o. M) Treating RN: Huel Coventry Primary Care Provider: Birdie Sons, ERIC Other Clinician: Referring Provider: Birdie Sons, ERIC Treating Provider/Extender: Linwood Dibbles, HOYT Weeks in Treatment: 14 Verbal / Phone Orders: No Diagnosis Coding ICD-10 Coding Code Description I87.2 Venous insufficiency (chronic) (peripheral) L97.822 Non-pressure chronic ulcer of other part of left lower leg with fat layer exposed D50.9 Iron deficiency anemia, unspecified J45.998 Other asthma Wound Cleansing Wound #1 Left,Anterior Lower Leg o Clean wound with Normal Saline. o May Shower, gently pat wound dry prior to applying new dressing. Anesthetic (add to Medication List) Wound #1 Left,Anterior Lower Leg o Topical Lidocaine 4% cream applied to wound bed prior to debridement (In Clinic Only). Primary Wound Dressing Wound #1 Left,Anterior Lower Leg o Silver Alginate Secondary Dressing Wound #1 Left,Anterior Lower Leg o ABD pad Dressing Change Frequency Wound #1 Left,Anterior Lower Leg o Change dressing every week Follow-up Appointments Wound #1 Left,Anterior Lower Leg o Return Appointment in 1 week. Edema Control Wound #1 Left,Anterior Lower Leg o 3 Layer Compression System - Left Lower Extremity o Elevate legs to the level of  the heart and pump ankles as often as possible Off-Loading Wound #1 Left,Anterior Lower Leg o Turn and reposition every 2 hours Johnathan SextonSHAMBLEY, Kiyoto ([id number]) Electronic Signature(s) Signed: 11/15/2018 4:45:46 PM By: Elliot GurneyWoody, BSN, RN, CWS, Kim RN, BSN Signed: 11/19/2018 9:05:33 PM By: Lenda KelpStone III, Hoyt  PA-C Entered By: Elliot GurneyWoody, BSN, RN, CWS, Kim on 11/15/2018 14:37:03 Johnathan SextonSHAMBLEY, Lean ([id number]) -------------------------------------------------------------------------------- Problem List Details Patient Name: Johnathan SextonSHAMBLEY, Johnathan Scott Date of Service: 11/15/2018 2:00 PM Medical Record Number: [id number] Patient Account Number: [id number] Date of Birth/Sex: 1966/08/18 (53 y.o. M) Treating RN: Huel CoventryWoody, Kim Primary Care Provider: Birdie SonsSONNENBERG, ERIC Other Clinician: Referring Provider: Birdie SonsSONNENBERG, ERIC Treating Provider/Extender: Linwood DibblesSTONE III, HOYT Weeks in Treatment: 14 Active Problems ICD-10 Evaluated Encounter Code Description Active Date Today Diagnosis I87.2 Venous insufficiency (chronic) (peripheral) 08/09/2018 No Yes L97.822 Non-pressure chronic ulcer of other part of left lower leg with 08/09/2018 No Yes fat layer exposed D50.9 Iron deficiency anemia, unspecified 08/09/2018 No Yes J45.998 Other asthma 08/09/2018 No Yes Inactive Problems Resolved Problems Electronic Signature(s) Signed: 11/19/2018 9:05:33 PM By: Lenda KelpStone III, Hoyt PA-C Entered By: Lenda KelpStone III, Hoyt on 11/15/2018 14:12:09 Johnathan SextonSHAMBLEY, Johnathan Scott ([id number]) -------------------------------------------------------------------------------- Progress Note Details Patient Name: Johnathan SextonSHAMBLEY, Johnathan Scott Date of Service: 11/15/2018 2:00 PM Medical Record Number: [id number] Patient Account Number: [id number] Date of Birth/Sex: 1966/08/18 (53 y.o. M) Treating RN: Huel CoventryWoody, Kim Primary Care Provider: Birdie SonsSONNENBERG, ERIC Other Clinician: Referring Provider: Birdie SonsSONNENBERG, ERIC Treating Provider/Extender: Linwood DibblesSTONE III, HOYT Weeks in Treatment: 14 Subjective Chief Complaint Information obtained from Patient Left Anterior LE Ulcer History of Present Illness (HPI) 08/09/18 on evaluation today patient presents for initial evaluation and clinic as referral concerning an ulcer that he has on the left anterior lower Trinity which has been present for five months. He states  that he actually noticed this after having an "insect bite" while driving down the road. He states that he did not see what kind of insect it was but he felt it wrong up his leg and then have the bite. Since that time is been having issues with the fact that this area does not want to heal. He does have a history of asthma as well as anemia is most recent CBC revealed a hemoglobin of 11.1 with a hematocrit of 34.6. He does have a history of panic attacks as well intermittently. No fevers, chills, nausea, or vomiting noted at this time. Specifically the patient does not appear to have any infection in regard to the left lower extremity. He's not on the current antibiotics. 08/16/18 upon evaluation today patient actually appears to be doing significantly better in regard to his wound. He has been tolerating the dressing changes that he was performing on his own over the last week. Subsequently we did not wrap his leg at that time although we discussed it. We weren't sure that he was gonna be able to get his pants on top of the dressing. Nonetheless the patient fortunately came today with shorts on he states that he is willing to be wrapped if it will help the wound to heal faster. He is pleased with the fact that the wound already appears to be doing better it's also not hurting as badly. The last debridement was a little bit more uncomfortable for him unfortunately. Nonetheless he states he's willing to allow me to debride it again today if I do so carefully. 08/24/18 upon evaluation today patient actually appears to be doing rather well in regard to his lower Trinity ulcer on the left. He has been tolerating the dressing changes with Iodoflex  including the wrap without complication and the wound [place] into making signs of good progress. Overall I'm very pleased with how things appear at this time. No fevers, chills, nausea, or vomiting noted at this time. 09/03/18 on evaluation today patient actually  appears to be doing rather well in regard to his wound. In fact the overall measurement belies the fact that the wound is actually healed quite a bit more than what the measurement would suggest. In fact it's almost half the size that it was during the last evaluation unfortunately a lot of the area where new skin has grown over is in between two areas that had to be clustered together one of the distal portion of the wound has not completely healed up. Nonetheless there is actually quite a bit more healing than what seems to be represented by the wound measurement today. Overall I'm very pleased with the overall progress and how he seems to be doing with the dressing changes. The wrap seems to be of great benefit for him as well. 09/09/18 upon evaluation today patient actually appears to be doing excellent in regard to his left lotion the ulcer. He has been tolerating the dressing changes without complication. There does not appear to be any evidence of infection overall I think the [facility] Dressing has done excellent for him. I'm very pleased. 09/23/18 evaluation today patient actually appears to be doing poorly in regard to his lower extremity ulcer. There's a lot of maceration and skin breakdown around the actual wound itself which is making it difficult to even tell exactly what is going on. This is something he states started about four days ago. Nonetheless I'm concerned about the possibility of infection just being that he was not having near this amount of drainage previous. He also states in the past 24 hours the pain has been a little bit more significant. No fevers, chills, nausea, or vomiting noted at this time. 09/30/18 on evaluation today patient actually appears to be doing poorly in regard to his lower extremity ulcer. He's been tolerating the dressing changes although there is a lot of tape irritation surrounding the wound area. Subsequently I do think [facility], [place]  ([id number]) that he would benefit from [place] initiating treatment with all the compression wrap as we did before to avoid any additional injury to the area. No fevers, chills, nausea, or vomiting noted at this time. The patient did have a return of his wound culture which showed Pseudomonas which will not be covered by the Bactrim that I previously prescribed for him. With that being said the patient states that he did not know I was sitting in a prescription for him and therefore never picked this up he also states the pharmacy did not call him. Nonetheless this was something that was sent we will call the pharmacy and having counseling as I'm gonna have to send them something different. 10/08/18 on evaluation today patient's ulcer still appears to be much larger than what it was previous to the deterioration. With that being said the erythema surrounding the wound bed is not nearly as significant I do believe the antibiotic has been of help for him. He did finally get the Levaquin which is good news. No fevers, chills, nausea, or vomiting noted at this time. 10/12/18 on evaluation today patient actually appears to be doing much better in regard to his lower extremity ulcer. Overall I do believe the dressing change has done well for him at this time. I'm pleased  with the overall progress even since just in the last week. 10/19/18 Seen today for follow up and management of left lower leg wound. Wound looks to be improving today and measuring smaller. Decreased surrounding erythema. No s/s of infection. Denies any increased pain, fever, or chills. 10/26/18 on evaluation today patient's wound actually appears to be doing very well. Unfortunately he has a new wound on the medial aspect of his lower extremity that is due to the wrap having slid down. He did not contact us as he did not know whether or not we be able to work him in. Nonetheless I told him in the future if this happens the definitely call us  and let us know. 11/01/18 on evaluation today patient's lower Trinity ulcer appears to show signs of improvement compared to last week's evaluation. In fact the new area which blistered and arose last week appears to likely be completely healed at this time. Fortunately there is no sign of infection which is also good news. Overall I'm very pleased with how things seem to be progressing. 11/08/18 on evaluation today patient appears to be doing better in regard to his lower extremity ulcer. Fortunately there is no evidence of infection at this time. Overall he seems to be making good progress which is good news. 11/15/18 on evaluation today patient appears to be doing very well at this point in regard to left anterior lower Trinity ulcer. This is not completely healed but does seem to be progressing in an appropriate fashion at this time. Fortunately there is no sign of infection currently. No fevers, chills, nausea, or vomiting noted at this time. Patient History Information obtained from Patient. Family History Cancer - Mother,Father,Siblings, Diabetes - Siblings, Heart Disease - Siblings, Hypertension - Siblings, No family history of Hereditary Spherocytosis, Kidney Disease, Lung Disease, Seizures, Stroke, Thyroid Problems, Tuberculosis. Social History Former smoker - 7 years, Marital Status - Single, Alcohol Use - Rarely, Drug Use - No History, Caffeine Use - Never. Medical History Eyes Denies history of Cataracts, Glaucoma, Optic Neuritis Ear/Nose/Mouth/Throat Denies history of Chronic sinus problems/congestion, Middle ear problems Hematologic/Lymphatic Denies history of Anemia, Hemophilia, Human Immunodeficiency Virus, Lymphedema, Sickle Cell Disease Respiratory Patient has history of Asthma - history as a child Denies history of Aspiration, Chronic Obstructive Pulmonary Disease (COPD), Pneumothorax, Sleep Apnea, Tuberculosis Cardiovascular Denies history of Angina, Arrhythmia,  Congestive Heart Failure, Coronary Artery Disease, Deep Vein Thrombosis, Hypertension, Hypotension, Myocardial Infarction, Peripheral Arterial Disease, Peripheral Venous Disease, Phlebitis, Knobloch, Ravindra ([id number]) Vasculitis Gastrointestinal Denies history of Cirrhosis , Colitis, Crohn s, Hepatitis A, Hepatitis B, Hepatitis C Endocrine Denies history of Type I Diabetes, Type II Diabetes Genitourinary Denies history of End Stage Renal Disease Immunological Denies history of Lupus Erythematosus, Raynaud s, Scleroderma Integumentary (Skin) Denies history of History of Burn, History of pressure wounds Musculoskeletal Patient has history of Osteoarthritis - both hips Denies history of Gout, Rheumatoid Arthritis, Osteomyelitis Neurologic Patient has history of Seizure Disorder - as a child Denies history of Dementia, Neuropathy, Quadriplegia, Paraplegia Oncologic Denies history of Received Chemotherapy, Received Radiation Psychiatric Denies history of Anorexia/bulimia, Confinement Anxiety Review of Systems (ROS) Constitutional Symptoms (General Health) Denies complaints or symptoms of Fever, Chills. Respiratory The patient has no complaints or symptoms. Cardiovascular Complains or has symptoms of LE edema. Psychiatric The patient has no complaints or symptoms. Objective Constitutional Well-nourished and well-hydrated in no acute distress. Vitals Time Taken: 2:06 PM, Height: 61 in, Weight: 232 lbs, BMI: 43.8, Temperature: 97.7 F, Pulse: 72 bpm, Respiratory  Rate: 16 breaths/min, Blood Pressure: 161/88 mmHg. Respiratory normal breathing without difficulty. clear to auscultation bilaterally. Cardiovascular regular rate and rhythm with normal S1, S2. 1+ pitting edema of the bilateral lower extremities. Psychiatric this patient is able to make decisions and demonstrates good insight into disease process. Alert and Oriented x 3. pleasant and cooperative. BRALIN, GARRY  ([id number]) General Notes: Patient's wound bed currently again did have some Slough noted on the surface of the wound which was cleaned away today with saline and gauze. No sharp debridement was required at this point. His swelling seems to be very well controlled with the compression wrap although he is tired of where this and wants to know if potentially we could discontinue it. Integumentary (Hair, Skin) Wound #1 status is Open. Original cause of wound was Gradually Appeared. The wound is located on the Left,Anterior Lower Leg. The wound measures 1cm length x 1.1cm width x 0.1cm depth; 0.864cm^2 area and 0.086cm^3 volume. There is Fat Layer (Subcutaneous Tissue) Exposed exposed. There is no tunneling or undermining noted. There is a medium amount of serous drainage noted. The wound margin is flat and intact. There is no granulation within the wound bed. There is a medium (34-66%) amount of necrotic tissue within the wound bed including Adherent Slough. The periwound skin appearance exhibited: Dry/Scaly, Hemosiderin Staining. The periwound skin appearance did not exhibit: Callus, Crepitus, Excoriation, Induration, Rash, Scarring, Maceration, Atrophie Blanche, Cyanosis, Ecchymosis, Mottled, Pallor, Rubor, Erythema. Periwound temperature was noted as No Abnormality. The periwound has tenderness on palpation. Assessment Active Problems ICD-10 Venous insufficiency (chronic) (peripheral) Non-pressure chronic ulcer of other part of left lower leg with fat layer exposed Iron deficiency anemia, unspecified Other asthma Plan Wound Cleansing: Wound #1 Left,Anterior Lower Leg: Clean wound with Normal Saline. May Shower, gently pat wound dry prior to applying new dressing. Anesthetic (add to Medication List): Wound #1 Left,Anterior Lower Leg: Topical Lidocaine 4% cream applied to wound bed prior to debridement (In Clinic Only). Primary Wound Dressing: Wound #1 Left,Anterior Lower Leg: Silver  Alginate Secondary Dressing: Wound #1 Left,Anterior Lower Leg: ABD pad Dressing Change Frequency: Wound #1 Left,Anterior Lower Leg: Change dressing every week Follow-up Appointments: Wound #1 Left,Anterior Lower Leg: Return Appointment in 1 week. Edema Control: Wound #1 Left,Anterior Lower Leg: 3 Layer Compression System - Left Lower Extremity TEVION, LAFORGE ([id number]) Elevate legs to the level of the heart and pump ankles as often as possible Off-Loading: Wound #1 Left,Anterior Lower Leg: Turn and reposition every 2 hours I explained to the patient that again I'm not sure that it would be a wise decision to discontinue the compression wrap at this point especially when he seems to be doing so well. This is something that is definitely his decision however if that something he would like to do. Nonetheless my personal opinion was not to do it and to continue with the compression wrap. He agreed to this for this week he will bring his Juxta-Lite compression for next week just in case he decides he wants to switch over to a different type of dressing and utilize the Juxta-Lite compression. We will subsequently see were things stand at follow-up. If anything changes worsens the meantime he will contact the office and let me know. Please see above for specific wound care orders. We will see patient for re-evaluation in 1 week(s) here in the clinic. If anything worsens or changes patient will contact our office for additional recommendations. Electronic Signature(s) Signed: 11/19/2018 9:05:33 PM By: Lenda Kelp PA-C  Entered By: Lenda Kelp on 11/19/2018 20:32:46 Johnathan Scott ([id number]) -------------------------------------------------------------------------------- ROS/PFSH Details Patient Name: Johnathan Scott Date of Service: 11/15/2018 2:00 PM Medical Record Number: [id number] Patient Account Number: [id number] Date of Birth/Sex: [birth year] (53 y.o. M) Treating RN:  Huel Coventry Primary Care Provider: Birdie Sons, ERIC Other Clinician: Referring Provider: Birdie Sons, ERIC Treating Provider/Extender: Linwood Dibbles, HOYT Weeks in Treatment: 14 Information Obtained From Patient Wound History Do you currently have one or more open woundso Yes How many open wounds do you currently haveo 1 Approximately how long have you had your woundso 5 months How have you been treating your wound(s) until nowo open to air Has your wound(s) ever healed and then re-openedo No Have you had any lab work done in the past montho No Have you tested positive for an antibiotic resistant organism (MRSA, VRE)o No Have you tested positive for osteomyelitis (bone infection)o No Have you had any tests for circulation on your legso No Have you had other problems associated with your woundso Swelling Constitutional Symptoms (General Health) Complaints and Symptoms: Negative for: Fever; Chills Cardiovascular Complaints and Symptoms: Positive for: LE edema Medical History: Negative for: Angina; Arrhythmia; Congestive Heart Failure; Coronary Artery Disease; Deep Vein Thrombosis; Hypertension; Hypotension; Myocardial Infarction; Peripheral Arterial Disease; Peripheral Venous Disease; Phlebitis; Vasculitis Eyes Medical History: Negative for: Cataracts; Glaucoma; Optic Neuritis Ear/Nose/Mouth/Throat Medical History: Negative for: Chronic sinus problems/congestion; Middle ear problems Hematologic/Lymphatic Medical History: Negative for: Anemia; Hemophilia; Human Immunodeficiency Virus; Lymphedema; Sickle Cell Disease Respiratory Complaints and Symptoms: No Complaints or Symptoms ROSAIRE, CUETO ([id number]) Medical History: Positive for: Asthma - history as a child Negative for: Aspiration; Chronic Obstructive Pulmonary Disease (COPD); Pneumothorax; Sleep Apnea; Tuberculosis Gastrointestinal Medical History: Negative for: Cirrhosis ; Colitis; Crohnos; Hepatitis A; Hepatitis B;  Hepatitis C Endocrine Medical History: Negative for: Type I Diabetes; Type II Diabetes Genitourinary Medical History: Negative for: End Stage Renal Disease Immunological Medical History: Negative for: Lupus Erythematosus; Raynaudos; Scleroderma Integumentary (Skin) Medical History: Negative for: History of Burn; History of pressure wounds Musculoskeletal Medical History: Positive for: Osteoarthritis - both hips Negative for: Gout; Rheumatoid Arthritis; Osteomyelitis Neurologic Medical History: Positive for: Seizure Disorder - as a child Negative for: Dementia; Neuropathy; Quadriplegia; Paraplegia Oncologic Medical History: Negative for: Received Chemotherapy; Received Radiation Psychiatric Complaints and Symptoms: No Complaints or Symptoms Medical History: Negative for: Anorexia/bulimia; Confinement Anxiety Immunizations Pneumococcal Vaccine: Received Pneumococcal Vaccination: No Implantable Devices ELWOOD, BAZINET ([id number]) Family and Social History Cancer: Yes - Mother,Father,Siblings; Diabetes: Yes - Siblings; Heart Disease: Yes - Siblings; Hereditary Spherocytosis: No; Hypertension: Yes - Siblings; Kidney Disease: No; Lung Disease: No; Seizures: No; Stroke: No; Thyroid Problems: No; Tuberculosis: No; Former smoker - 7 years; Marital Status - Single; Alcohol Use: Rarely; Drug Use: No History; Caffeine Use: Never; Financial Concerns: No; Food, Clothing or Shelter Needs: No; Support System Lacking: No; Transportation Concerns: No; Advanced Directives: No; Patient does not want information on Advanced Directives Physician Affirmation I have reviewed and agree with the above information. Electronic Signature(s) Signed: 11/19/2018 9:05:33 PM By: Lenda Kelp PA-C Signed: 12/09/2018 7:49:08 AM By: Elliot Gurney BSN, RN, CWS, Kim RN, BSN Entered By: Lenda Kelp on 11/19/2018 20:31:37 ANDRIS, BROTHERS  ([id number]) -------------------------------------------------------------------------------- SuperBill Details Patient Name: Johnathan Scott Date of Service: 11/15/2018 Medical Record Number: [id number] Patient Account Number: [id number] Date of Birth/Sex: 1965-11-14 (53 y.o. M) Treating RN: Huel Coventry Primary Care Provider: Birdie Sons, ERIC Other Clinician: Referring Provider: Birdie Sons, ERIC Treating Provider/Extender: Linwood Dibbles, HOYT Weeks in Treatment: 14 Diagnosis Coding ICD-10 Codes Code Description  I87.2 Venous insufficiency (chronic) (peripheral) L97.822 Non-pressure chronic ulcer of other part of left lower leg with fat layer exposed D50.9 Iron deficiency anemia, unspecified J45.998 Other asthma Facility Procedures CPT4 Code: [id number] Description: (Facility Use Only) [phone] - APPLY MULTLAY COMPRS LWR LT LEG Modifier: Quantity: 1 Physician Procedures CPT4 Code Description: [id number] - WC PHYS LEVEL 4 - EST PT ICD-10 Diagnosis Description I87.2 Venous insufficiency (chronic) (peripheral) L97.822 Non-pressure chronic ulcer of other part of left lower leg wit D50.9 Iron deficiency anemia,  unspecified J45.998 Other asthma Modifier: h fat layer expos Quantity: 1 ed Electronic Signature(s) Signed: 11/19/2018 9:05:33 PM By: Lenda Kelp PA-C Previous Signature: 11/15/2018 4:45:46 PM Version By: Elliot Gurney, BSN, RN, CWS, Kim RN, BSN Entered By: Lenda Kelp on 11/15/2018 23:49:59

## 2018-12-10 ENCOUNTER — Encounter: Payer: Self-pay | Admitting: Family Medicine

## 2018-12-10 ENCOUNTER — Ambulatory Visit (INDEPENDENT_AMBULATORY_CARE_PROVIDER_SITE_OTHER): Payer: 59 | Admitting: Family Medicine

## 2018-12-10 VITALS — BP 150/100 | HR 76 | Temp 97.5°F | Wt 220.2 lb

## 2018-12-10 DIAGNOSIS — I10 Essential (primary) hypertension: Secondary | ICD-10-CM

## 2018-12-10 DIAGNOSIS — M79602 Pain in left arm: Secondary | ICD-10-CM | POA: Insufficient documentation

## 2018-12-10 DIAGNOSIS — D649 Anemia, unspecified: Secondary | ICD-10-CM

## 2018-12-10 DIAGNOSIS — H547 Unspecified visual loss: Secondary | ICD-10-CM | POA: Diagnosis not present

## 2018-12-10 NOTE — Assessment & Plan Note (Signed)
Uncontrolled.  Discussed restarting lisinopril.  If he has recurrence of urinary frequency and diarrhea he can discontinue this and let us know.  Discussed the importance of returning in 1 week for labs and BP check.

## 2018-12-10 NOTE — Progress Notes (Signed)
Marikay Alar, MD Phone: [phone]  Johnathan Scott. is a 53 y.o. male who presents today for follow-up.  CC: Hypertension, anemia, left arm pain, vision issues  Hypertension: Not checking blood pressures.  He took the lisinopril for 4 days and then discontinued it because he developed an illness with increased urination and diarrhea.  He notes no chest pain or shortness of breath.  He notes he had increased frequency of urination and diarrhea.  He is unsure if this was related to the medication or an illness as those around him were ill as well.  He notes the diarrhea and urinary frequency resolved.  No blood in his stool or urine.  No dysuria.  He has bilateral leg edema that comes and goes.  Anemia: He reports a long history of this going back to when he was a kid.  He describes being told that he had iron poor blood.  He notes no blood in his stool or urine.  Prior iron testing seemed to indicate anemia of chronic disease.  Left arm pain: Notes that his left arm in the upper lateral portion hurts when he is carrying things or lifting things.  Does not hurt at this time.  Vision issues: Patient notes for some time he has had blurry vision.  He has difficulty seeing signs at night.  He notes no eye pain.  He has not seen the eye doctor.  Social History   Tobacco Use  Smoking Status Former Smoker  . Last attempt to quit: 02/08/2007  . Years since quitting: 11.8  Smokeless Tobacco Never Used     ROS see history of present illness  Objective  Physical Exam Vitals:   12/10/18 1548  BP: (!) 150/100  Pulse: 76  Temp: (!) 97.5 F (36.4 C)  SpO2: 99%    BP Readings from Last 3 Encounters:  12/10/18 (!) 150/100  11/22/18 (!) 160/90  11/10/18 (!) 146/82   Wt Readings from Last 3 Encounters:  12/10/18 220 lb 3.2 oz (99.9 kg)  11/22/18 216 lb (98 kg)  08/09/18 213 lb 3.2 oz (96.7 kg)    Physical Exam Constitutional:      General: He is not in acute distress.  Appearance: He is not diaphoretic.  Eyes:     Conjunctiva/sclera: Conjunctivae normal.     Pupils: Pupils are equal, round, and reactive to light.     Comments: Likely cataracts on fundus exam, difficult to see posterior portion of the eye as it appears blurry, there does appear to be a dark spot within the central portion of the anterior eye  Cardiovascular:     Rate and Rhythm: Normal rate and regular rhythm.     Heart sounds: Normal heart sounds.  Pulmonary:     Effort: Pulmonary effort is normal.     Breath sounds: Normal breath sounds.  Musculoskeletal:     Comments: Left upper arm with no tenderness or palpable defects  Skin:    General: Skin is warm and dry.  Neurological:     Mental Status: He is alert.      Assessment/Plan: Please see individual problem list.  HTN (hypertension) Uncontrolled.  Discussed restarting lisinopril.  If he has recurrence of urinary frequency and diarrhea he can discontinue this and let us know.  Discussed the importance of returning in 1 week for labs and BP check.  Anemia Anemic in the past.  We will plan to recheck with labs in 1 week.   Poor vision Suspect likely  cataracts based on description of night vision issues and exam.  Will refer to ophthalmology.  Left arm pain Suspect muscular strain.  Asymptomatic currently.  He will monitor.   Orders Placed This Encounter  Procedures  . Iron, TIBC and Ferritin Panel    Standing Status:   Future    Standing Expiration Date:   12/11/2019  . CBC    Standing Status:   Future    Standing Expiration Date:   12/11/2019  . Basic Metabolic Panel (BMET)    Standing Status:   Future    Standing Expiration Date:   12/11/2019  . Ambulatory referral to Ophthalmology    Referral Priority:   Routine    Referral Type:   Consultation    Referral Reason:   Specialty Services Required    Requested Specialty:   Ophthalmology    Number of Visits Requested:   1    No orders of the defined types were  placed in this encounter.    Marikay Alar, MD [facility]

## 2018-12-10 NOTE — Assessment & Plan Note (Signed)
Suspect likely cataracts based on description of night vision issues and exam.  Will refer to ophthalmology.

## 2018-12-10 NOTE — Patient Instructions (Signed)
Nice to see you. Please restart the lisinopril.  If you have recurrence of diarrhea and increased urinary frequency please discontinue the medication and let us know.  We will have you return in 1 week for BP check and lab work.

## 2018-12-10 NOTE — Assessment & Plan Note (Signed)
Suspect muscular strain.  Asymptomatic currently.  He will monitor.

## 2018-12-10 NOTE — Assessment & Plan Note (Signed)
Anemic in the past.  We will plan to recheck with labs in 1 week.

## 2018-12-16 ENCOUNTER — Other Ambulatory Visit (INDEPENDENT_AMBULATORY_CARE_PROVIDER_SITE_OTHER): Payer: 59

## 2018-12-16 ENCOUNTER — Ambulatory Visit: Payer: 59 | Admitting: *Deleted

## 2018-12-16 ENCOUNTER — Encounter: Payer: 59 | Admitting: Physician Assistant

## 2018-12-16 VITALS — BP 130/70 | HR 83

## 2018-12-16 DIAGNOSIS — I10 Essential (primary) hypertension: Secondary | ICD-10-CM | POA: Diagnosis not present

## 2018-12-16 DIAGNOSIS — E11622 Type 2 diabetes mellitus with other skin ulcer: Secondary | ICD-10-CM | POA: Diagnosis not present

## 2018-12-16 DIAGNOSIS — L97822 Non-pressure chronic ulcer of other part of left lower leg with fat layer exposed: Secondary | ICD-10-CM | POA: Diagnosis not present

## 2018-12-16 DIAGNOSIS — D649 Anemia, unspecified: Secondary | ICD-10-CM

## 2018-12-16 LAB — BASIC METABOLIC PANEL
BUN: 33 mg/dL — AB (ref 6–23)
CALCIUM: 9.3 mg/dL (ref 8.4–10.5)
CO2: 27 mEq/L (ref 19–32)
CREATININE: 1.37 mg/dL (ref 0.40–1.50)
Chloride: 106 mEq/L (ref 96–112)
GFR: 53.96 mL/min — ABNORMAL LOW (ref >60.00)
Glucose, Bld: 84 mg/dL (ref 70–99)
POTASSIUM: 4.4 meq/L (ref 3.5–5.1)
SODIUM: 141 meq/L (ref 135–145)

## 2018-12-16 LAB — CBC
HEMATOCRIT: 35.3 % — AB (ref 39.0–52.0)
Hemoglobin: 11.4 g/dL — ABNORMAL LOW (ref 13.0–17.0)
MCHC: 32.3 g/dL (ref 30.0–36.0)
MCV: 88.1 fl (ref 78.0–100.0)
Platelets: 215 10*3/uL (ref 150.0–400.0)
RBC: 4.01 Mil/uL — ABNORMAL LOW (ref 4.22–5.81)
RDW: 13.6 % (ref 11.5–15.5)
WBC: 6.3 10*3/uL (ref 4.0–10.5)

## 2018-12-16 LAB — IBC + FERRITIN
IRON: 48 ug/dL (ref 42–165)
SATURATION RATIOS: 13.8 % — AB (ref 20.0–50.0)
Transferrin: 248 mg/dL (ref 212.0–360.0)

## 2018-12-16 NOTE — Progress Notes (Signed)
Pt was here for BP check  BP was 130/70 pulse was 83 O2 was 98.  Coralee North, CMA

## 2018-12-16 NOTE — Addendum Note (Signed)
Addended by: Warden Fillers on: 12/16/2018 02:08 PM   Modules accepted: Orders

## 2018-12-17 NOTE — Progress Notes (Signed)
Johnathan Scott, Johnathan Scott ([id number]) Visit Report for 12/09/2018 Arrival Information Details Patient Name: Johnathan Scott, Johnathan Scott Date of Service: 12/09/2018 3:15 PM Medical Record Number: [id number] Patient Account Number: [id number] Date of Birth/Sex: 01-Mar-1966 (53 y.o. M) Treating RN: Arnette Norris Primary Care Jenae Tomasello: Birdie Sons, ERIC Other Clinician: Referring Ireland Virrueta: Birdie Sons, ERIC Treating Sandy Blouch/Extender: Linwood Dibbles, HOYT Weeks in Treatment: 17 Visit Information History Since Last Visit Added or deleted any medications: No Patient Arrived: Ambulatory Any new allergies or adverse reactions: No Arrival Time: 15:10 Had a fall or experienced change in No Accompanied By: self activities of daily living that may affect Transfer Assistance: None risk of falls: Patient Identification Verified: Yes Signs or symptoms of abuse/neglect since last visito No Secondary Verification Process Yes Hospitalized since last visit: No Completed: Has Dressing in Place as Prescribed: Yes Patient Has Alerts: Yes Pain Present Now: No Patient Alerts: PT REFUSED FACE PHOTO Electronic Signature(s) Signed: 12/14/2018 11:13:25 AM By: Arnette Norris Entered By: Arnette Norris on 12/09/2018 15:10:48 Johnathan Scott ([id number]) -------------------------------------------------------------------------------- Compression Therapy Details Patient Name: Johnathan Scott Date of Service: 12/09/2018 3:15 PM Medical Record Number: [id number] Patient Account Number: [id number] Date of Birth/Sex: 01-Dec-1965 (53 y.o. M) Treating RN: Rodell Perna Primary Care Luis Sami: Birdie Sons ERIC Other Clinician: Referring Kayman Snuffer: Birdie Sons, ERIC Treating Annia Gomm/Extender: Linwood Dibbles, HOYT Weeks in Treatment: 17 Compression Therapy Performed for Wound Assessment: Wound #1 Left,Anterior Lower Leg Performed By: Clinician Rodell Perna, RN Compression Type: Three Layer Pre Treatment ABI: 1.2 Post Procedure Diagnosis Same as  Pre-procedure Electronic Signature(s) Signed: 12/10/2018 4:09:31 PM By: Rodell Perna Entered By: Rodell Perna on 12/09/2018 15:33:54 Johnathan Scott ([id number]) -------------------------------------------------------------------------------- Encounter Discharge Information Details Patient Name: Johnathan Scott Date of Service: 12/09/2018 3:15 PM Medical Record Number: [id number] Patient Account Number: [id number] Date of Birth/Sex: [birth year] (53 y.o. M) Treating RN: Rodell Perna Primary Care Murrell Elizondo: Birdie Sons, ERIC Other Clinician: Referring Ardene Remley: Birdie Sons, ERIC Treating Geniva Lohnes/Extender: Linwood Dibbles, HOYT Weeks in Treatment: 17 Encounter Discharge Information Items Discharge Condition: Stable Ambulatory Status: Ambulatory Discharge Destination: Home Transportation: Private Auto Accompanied By: self Schedule Follow-up Appointment: Yes Clinical Summary of Care: Electronic Signature(s) Signed: 12/10/2018 4:09:31 PM By: Rodell Perna Entered By: Rodell Perna on 12/09/2018 15:35:09 Johnathan Scott ([id number]) -------------------------------------------------------------------------------- Lower Extremity Assessment Details Patient Name: Johnathan Scott Date of Service: 12/09/2018 3:15 PM Medical Record Number: [id number] Patient Account Number: [id number] Date of Birth/Sex: 28-Aug-1966 (53 y.o. M) Treating RN: Arnette Norris Primary Care Ludie Hudon: Birdie Sons ERIC Other Clinician: Referring Yumiko Alkins: Birdie Sons, ERIC Treating Ashana Tullo/Extender: Linwood Dibbles, HOYT Weeks in Treatment: 17 Electronic Signature(s) Signed: 12/14/2018 11:13:25 AM By: Arnette Norris Entered By: Arnette Norris on 12/09/2018 15:18:56 Johnathan Scott ([id number]) -------------------------------------------------------------------------------- Multi Wound Chart Details Patient Name: Johnathan Scott Date of Service: 12/09/2018 3:15 PM Medical Record Number: [id number] Patient Account Number:  [id number] Date of Birth/Sex: 12-03-1965 (53 y.o. M) Treating RN: Rodell Perna Primary Care Cashius Grandstaff: Birdie Sons, ERIC Other Clinician: Referring Mariza Bourget: Birdie Sons, ERIC Treating Viyan Rosamond/Extender: Linwood Dibbles, HOYT Weeks in Treatment: 17 Vital Signs Height(in): 61 Pulse(bpm): 77 Weight(lbs): 232 Blood Pressure(mmHg): 154/67 Body Mass Index(BMI): 44 Temperature(F): 97.6 Respiratory Rate 18 (breaths/min): Photos: [1:No Photos] [N/A:N/A] Wound Location: [1:Left Lower Leg - Anterior] [N/A:N/A] Wounding Event: [1:Gradually Appeared] [N/A:N/A] Primary Etiology: [1:Venous Leg Ulcer] [N/A:N/A] Comorbid History: [1:Asthma, Osteoarthritis, Seizure Disorder] [N/A:N/A] Date Acquired: [1:03/08/2018] [N/A:N/A] Weeks of Treatment: [1:17] [N/A:N/A] Wound Status: [1:Open] [N/A:N/A] Measurements L x W x D [1:0.7x0.5x0.1] [N/A:N/A] (cm) Area (cm) : [1:0.275] [N/A:N/A] Volume (cm) : [1:0.027] [N/A:N/A] % Reduction in Area: [1:97.20%] [N/A:N/A] % Reduction in Volume: [1:98.60%] [N/A:N/A] Classification: [1:Full  Thickness Without Exposed Support Structures] [N/A:N/A] Exudate Amount: [1:Small] [N/A:N/A] Exudate Type: [1:Serous] [N/A:N/A] Exudate Color: [1:amber] [N/A:N/A] Wound Margin: [1:Flat and Intact] [N/A:N/A] Granulation Amount: [1:None Present (0%)] [N/A:N/A] Necrotic Amount: [1:Medium (34-66%)] [N/A:N/A] Necrotic Tissue: [1:Eschar, Adherent Slough] [N/A:N/A] Exposed Structures: [1:Fat Layer (Subcutaneous Tissue) Exposed: Yes Fascia: No Tendon: No Muscle: No Joint: No Bone: No] [N/A:N/A] Epithelialization: [1:Small (1-33%)] [N/A:N/A] Periwound Skin Texture: [1:Excoriation: No Induration: No Callus: No Crepitus: No] [N/A:N/A] Rash: No Scarring: No Periwound Skin Moisture: Dry/Scaly: Yes N/A N/A Maceration: No Periwound Skin Color: Hemosiderin Staining: Yes N/A N/A Atrophie Blanche: No Cyanosis: No Ecchymosis: No Erythema: No Mottled: No Pallor: No Rubor: No Temperature:  No Abnormality N/A N/A Tenderness on Palpation: No N/A N/A Wound Preparation: Ulcer Cleansing: Other: soap N/A N/A and water Topical Anesthetic Applied: Other: lidocaine 4% Treatment Notes Electronic Signature(s) Signed: 12/10/2018 4:09:31 PM By: Rodell Perna Entered By: Rodell Perna on 12/09/2018 15:29:11 Johnathan Scott ([id number]) -------------------------------------------------------------------------------- Multi-Disciplinary Care Plan Details Patient Name: Johnathan Scott Date of Service: 12/09/2018 3:15 PM Medical Record Number: [id number] Patient Account Number: [id number] Date of Birth/Sex: 11/30/1965 (53 y.o. M) Treating RN: Rodell Perna Primary Care Adaja Wander: Birdie Sons, ERIC Other Clinician: Referring Bader Stubblefield: Birdie Sons, ERIC Treating Kennette Cuthrell/Extender: Linwood Dibbles, HOYT Weeks in Treatment: 17 Active Inactive Necrotic Tissue Nursing Diagnoses: Knowledge deficit related to management of necrotic/devitalized tissue Goals: Necrotic/devitalized tissue will be minimized in the wound bed Date Initiated: 08/09/2018 Target Resolution Date: 09/09/2018 Goal Status: Active Interventions: Assess patient pain level pre-, during and post procedure and prior to discharge Treatment Activities: Apply topical anesthetic as ordered : 08/09/2018 Excisional debridement : 08/09/2018 Notes: Orientation to the Wound Care Program Nursing Diagnoses: Knowledge deficit related to the wound healing center program Goals: Patient/caregiver will verbalize understanding of the Wound Healing Center Program Date Initiated: 08/09/2018 Target Resolution Date: 09/09/2018 Goal Status: Active Interventions: Provide education on orientation to the wound center Notes: Wound/Skin Impairment Nursing Diagnoses: Impaired tissue integrity Knowledge deficit related to smoking impact on wound healing Goals: Ulcer/skin breakdown will have a volume reduction of 30% by week 4 Johnathan Scott, Johnathan Scott  ([id number]) Date Initiated: 08/09/2018 Target Resolution Date: 09/09/2018 Goal Status: Active Interventions: Assess patient/caregiver ability to obtain necessary supplies Assess ulceration(s) every visit Treatment Activities: Topical wound management initiated : 08/09/2018 Notes: Electronic Signature(s) Signed: 12/10/2018 4:09:31 PM By: Rodell Perna Entered By: Rodell Perna on 12/09/2018 15:29:02 Johnathan Scott ([id number]) -------------------------------------------------------------------------------- Pain Assessment Details Patient Name: Johnathan Scott Date of Service: 12/09/2018 3:15 PM Medical Record Number: [id number] Patient Account Number: [id number] Date of Birth/Sex: May 24, 1966 (53 y.o. M) Treating RN: Arnette Norris Primary Care Kiona Blume: Birdie Sons, ERIC Other Clinician: Referring Doninique Lwin: Birdie Sons, ERIC Treating Ifeanyichukwu Wickham/Extender: Linwood Dibbles, HOYT Weeks in Treatment: 17 Active Problems Location of Pain Severity and Description of Pain Patient Has Paino No Site Locations Pain Management and Medication Current Pain Management: Electronic Signature(s) Signed: 12/14/2018 11:13:25 AM By: Arnette Norris Entered By: Arnette Norris on 12/09/2018 15:11:39 Johnathan Scott ([id number]) -------------------------------------------------------------------------------- Patient/Caregiver Education Details Patient Name: Johnathan Scott Date of Service: 12/09/2018 3:15 PM Medical Record Number: [id number] Patient Account Number: [id number] Date of Birth/Gender: 12-30-1965 (53 y.o. M) Treating RN: Rodell Perna Primary Care Physician: Birdie Sons, ERIC Other Clinician: Referring Physician: Birdie Sons, ERIC Treating Physician/Extender: Skeet Simmer in Treatment: 17 Education Assessment Education Provided To: Patient Education Topics Provided Wound/Skin Impairment: Handouts: Caring for Your Ulcer Methods: Demonstration, Explain/Verbal Responses: State content  correctly Electronic Signature(s) Signed: 12/10/2018 4:09:31 PM By: Rodell Perna Entered By: Rodell Perna on 12/09/2018 15:34:22 Johnathan Scott ([id number]) --------------------------------------------------------------------------------  Wound Assessment Details Patient Name: Johnathan Scott, Johnathan Scott Date of Service: 12/09/2018 3:15 PM Medical Record Number: [id number] Patient Account Number: [id number] Date of Birth/Sex: 02-25-1966 (53 y.o. M) Treating RN: Arnette Norris Primary Care Kellee Sittner: Birdie Sons, ERIC Other Clinician: Referring Khaled Herda: Birdie Sons, ERIC Treating [place] Deleo/Extender: Linwood Dibbles, HOYT Weeks in Treatment: 17 Wound Status Wound Number: 1 Primary Etiology: Venous Leg Ulcer Wound Location: Left Lower Leg - Anterior Wound Status: Open Wounding Event: Gradually Appeared Comorbid History: Asthma, Osteoarthritis, Seizure Disorder Date Acquired: 03/08/2018 Weeks Of Treatment: 17 Clustered Wound: No Photos Photo Uploaded By: Curtis Sites on 12/09/2018 16:34:43 Wound Measurements Length: (cm) 0.7 Width: (cm) 0.5 Depth: (cm) 0.1 Area: (cm) 0.275 Volume: (cm) 0.027 % Reduction in Area: 97.2% % Reduction in Volume: 98.6% Epithelialization: Small (1-33%) Tunneling: No Undermining: No Wound Description Full Thickness Without Exposed Support Classification: Structures Wound Margin: Flat and Intact Exudate Small Amount: Exudate Type: Serous Exudate Color: amber Foul Odor After Cleansing: No Slough/Fibrino Yes Wound Bed Granulation Amount: None Present (0%) Exposed Structure Necrotic Amount: Medium (34-66%) Fascia Exposed: No Necrotic Quality: Eschar, Adherent Slough Fat Layer (Subcutaneous Tissue) Exposed: Yes Tendon Exposed: No Muscle Exposed: No Joint Exposed: No Bone Exposed: No Johnathan Scott, Johnathan Scott ([id number]) Periwound Skin Texture Texture Color No Abnormalities Noted: No No Abnormalities Noted: No Callus: No Atrophie Blanche: No Crepitus:  No Cyanosis: No Excoriation: No Ecchymosis: No Induration: No Erythema: No Rash: No Hemosiderin Staining: Yes Scarring: No Mottled: No Pallor: No Moisture Rubor: No No Abnormalities Noted: No Dry / Scaly: Yes Temperature / Pain Maceration: No Temperature: No Abnormality Wound Preparation Ulcer Cleansing: Other: soap and water, Topical Anesthetic Applied: Other: lidocaine 4%, Electronic Signature(s) Signed: 12/14/2018 11:13:25 AM By: Arnette Norris Entered By: Arnette Norris on 12/09/2018 15:18:45 Johnathan Scott ([id number]) -------------------------------------------------------------------------------- Vitals Details Patient Name: Johnathan Scott Date of Service: 12/09/2018 3:15 PM Medical Record Number: [id number] Patient Account Number: [id number] Date of Birth/Sex: [birth year] (53 y.o. M) Treating RN: Arnette Norris Primary Care Jaidah Lomax: Birdie Sons, ERIC Other Clinician: Referring Thien Berka: Birdie Sons, ERIC Treating Chessie Neuharth/Extender: Linwood Dibbles, HOYT Weeks in Treatment: 17 Vital Signs Time Taken: 15:10 Temperature (F): 97.6 Height (in): 61 Pulse (bpm): 77 Weight (lbs): 232 Respiratory Rate (breaths/min): 18 Body Mass Index (BMI): 43.8 Blood Pressure (mmHg): 154/67 Reference Range: 80 - 120 mg / dl Electronic Signature(s) Signed: 12/14/2018 11:13:25 AM By: Arnette Norris Entered By: Arnette Norris on 12/09/2018 15:17:57

## 2018-12-17 NOTE — Progress Notes (Signed)
Johnathan Scott, Arch ([id number]) Visit Report for 12/09/2018 Chief Complaint Document Details Patient Name: Johnathan Scott, Johnathan Scott Date of Service: 12/09/2018 3:15 PM Medical Record Number: [id number] Patient Account Number: [id number] Date of Birth/Sex: 1965/10/26 (53 y.o. M) Treating RN: Curtis Sitesorthy, Joanna Primary Care Provider: Birdie SonsSONNENBERG, ERIC Other Clinician: Referring Provider: Birdie SonsSONNENBERG, ERIC Treating Provider/Extender: Linwood DibblesSTONE III, Shayne Diguglielmo Weeks in Treatment: 17 Information Obtained from: Patient Chief Complaint Left Anterior LE Ulcer Electronic Signature(s) Signed: 12/16/2018 9:19:49 AM By: Lenda KelpStone III, Mariadelosang Wynns PA-C Entered By: Lenda KelpStone III, Jenella Craigie on 12/09/2018 15:26:56 Johnathan Scott, Johnathan Scott ([id number]) -------------------------------------------------------------------------------- HPI Details Patient Name: Johnathan Scott, Johnathan Scott Date of Service: 12/09/2018 3:15 PM Medical Record Number: [id number] Patient Account Number: [id number] Date of Birth/Sex: 1965/10/26 (53 y.o. M) Treating RN: Curtis Sitesorthy, Joanna Primary Care Provider: Birdie SonsSONNENBERG, ERIC Other Clinician: Referring Provider: Birdie SonsSONNENBERG, ERIC Treating Provider/Extender: Linwood DibblesSTONE III, Mikesha Migliaccio Weeks in Treatment: 17 History of Present Illness HPI Description: 08/09/18 on evaluation today patient presents for initial evaluation and clinic as referral concerning an ulcer that he has on the left anterior lower Trinity which has been present for five months. He states that he actually noticed this after having an "insect bite" while driving down the road. He states that he did not see what kind of insect it was but he felt it wrong up his leg and then have the bite. Since that time is been having issues with the fact that this area does not want to heal. He does have a history of asthma as well as anemia is most recent CBC revealed a hemoglobin of 11.1 with a hematocrit of 34.6. He does have a history of panic attacks as well intermittently. No fevers, chills, nausea, or  vomiting noted at this time. Specifically the patient does not appear to have any infection in regard to the left lower extremity. He's not on the current antibiotics. 08/16/18 upon evaluation today patient actually appears to be doing significantly better in regard to his wound. He has been tolerating the dressing changes that he was performing on his own over the last week. Subsequently we did not wrap his leg at that time although we discussed it. We weren't sure that he was gonna be able to get his pants on top of the dressing. Nonetheless the patient fortunately came today with shorts on he states that he is willing to be wrapped if it will help the wound to heal faster. He is pleased with the fact that the wound already appears to be doing better it's also not hurting as badly. The last debridement was a little bit more uncomfortable for him unfortunately. Nonetheless he states he's willing to allow me to debride it again today if I do so carefully. 08/24/18 upon evaluation today patient actually appears to be doing rather well in regard to his lower Trinity ulcer on the left. He has been tolerating the dressing changes with Iodoflex including the wrap without complication and the wound us into making signs of good progress. Overall I'm very pleased with how things appear at this time. No fevers, chills, nausea, or vomiting noted at this time. 09/03/18 on evaluation today patient actually appears to be doing rather well in regard to his wound. In fact the overall measurement belies the fact that the wound is actually healed quite a bit more than what the measurement would suggest. In fact it's almost half the size that it was during the last evaluation unfortunately a lot of the area where new skin has grown over is in between two areas that had to  be clustered together one of the distal portion of the wound has not completely healed up. Nonetheless there is actually quite a bit more healing  than what seems to be represented by the wound measurement today. Overall I'm very pleased with the overall progress and how he seems to be doing with the dressing changes. The wrap seems to be of great benefit for him as well. 09/09/18 upon evaluation today patient actually appears to be doing excellent in regard to his left lotion the ulcer. He has been tolerating the dressing changes without complication. There does not appear to be any evidence of infection overall I think the [facility] Dressing has done excellent for him. I'm very pleased. 09/23/18 evaluation today patient actually appears to be doing poorly in regard to his lower extremity ulcer. There's a lot of maceration and skin breakdown around the actual wound itself which is making it difficult to even tell exactly what is going on. This is something he states started about four days ago. Nonetheless I'm concerned about the possibility of infection just being that he was not having near this amount of drainage previous. He also states in the past 24 hours the pain has been a little bit more significant. No fevers, chills, nausea, or vomiting noted at this time. 09/30/18 on evaluation today patient actually appears to be doing poorly in regard to his lower extremity ulcer. He's been tolerating the dressing changes although there is a lot of tape irritation surrounding the wound area. Subsequently I do think that he would benefit from us initiating treatment with all the compression wrap as we did before to avoid any additional injury to the area. No fevers, chills, nausea, or vomiting noted at this time. The patient did have a return of his wound culture which showed Pseudomonas which will not be covered by the Bactrim that I previously prescribed for him. With that being said the patient states that he did not know I was sitting in a prescription for him and therefore never picked this up he also states the pharmacy did not call  him. Nonetheless this was something that was sent we will call the pharmacy and having counseling as Johnathan Scott, Jaydyn ([id number]) I'm gonna have to send them something different. 10/08/18 on evaluation today patient's ulcer still appears to be much larger than what it was previous to the deterioration. With that being said the erythema surrounding the wound bed is not nearly as significant I do believe the antibiotic has been of help for him. He did finally get the Levaquin which is good news. No fevers, chills, nausea, or vomiting noted at this time. 10/12/18 on evaluation today patient actually appears to be doing much better in regard to his lower extremity ulcer. Overall I do believe the dressing change has done well for him at this time. I'm pleased with the overall progress even since just in the last week. 10/19/18 Seen today for follow up and management of left lower leg wound. Wound looks to be improving today and measuring smaller. Decreased surrounding erythema. No s/s of infection. Denies any increased pain, fever, or chills. 10/26/18 on evaluation today patient's wound actually appears to be doing very well. Unfortunately he has a new wound on the medial aspect of his lower extremity that is due to the wrap having slid down. He did not contact us as he did not know whether or not we be able to work him in. Nonetheless I told him in the future if  this happens the definitely call us and let us know. 11/01/18 on evaluation today patient's lower Trinity ulcer appears to show signs of improvement compared to last week's evaluation. In fact the new area which blistered and arose last week appears to likely be completely healed at this time. Fortunately there is no sign of infection which is also good news. Overall I'm very pleased with how things seem to be progressing. 11/08/18 on evaluation today patient appears to be doing better in regard to his lower extremity ulcer. Fortunately there is  no evidence of infection at this time. Overall he seems to be making good progress which is good news. 11/15/18 on evaluation today patient appears to be doing very well at this point in regard to left anterior lower Trinity ulcer. This is not completely healed but does seem to be progressing in an appropriate fashion at this time. Fortunately there is no sign of infection currently. No fevers, chills, nausea, or vomiting noted at this time. 11/26/2018 Seen today for follow-up and management of left lower leg anterior ulcer. Wound continues to be progressing well. There is no evidence of infection. He did express wanting to end wound care and just to apply a Band-Aid over the wound. Discussed healing process and the need for ongoing compliance with wound care. He denies any immediate concerns during visit. No recent fevers, chills, or shortness of breath. 12/02/18 on evaluation today patient actually appears to be doing very well in regard to his lower extremity ulcer which is shown signs of good improvement. Fortunately there is no sign of infection at this time. Patient has been tolerating the dressing changes without complication. I'm very pleased with how things have been progressing. No fevers, chills, nausea, or vomiting noted at this time. 12/09/18 on evaluation today patient continues to made excellent progress in regard to his left lower extremity ulcer. He's been tolerating the dressing changes without complication. The wrap does seem to be doing his job and is doing so excellently. The biggest issue is he is having this work to come in to get the wrap change. Fortunately there's no signs of infection at this time which is good news. Electronic Signature(s) Signed: 12/16/2018 9:19:49 AM By: Lenda Kelp PA-C Entered By: Lenda Kelp on 12/10/2018 10:13:17 Johnathan Scott ([id number]) -------------------------------------------------------------------------------- Physical Exam  Details Patient Name: Johnathan Scott Date of Service: 12/09/2018 3:15 PM Medical Record Number: [id number] Patient Account Number: [id number] Date of Birth/Sex: [birth year] (53 y.o. M) Treating RN: Curtis Sites Primary Care Provider: Birdie Sons, ERIC Other Clinician: Referring Provider: Birdie Sons, ERIC Treating Provider/Extender: STONE III, Janashia Parco Weeks in Treatment: 17 Constitutional Well-nourished and well-hydrated in no acute distress. Respiratory normal breathing without difficulty. clear to auscultation bilaterally. Cardiovascular regular rate and rhythm with normal S1, S2. Psychiatric this patient is able to make decisions and demonstrates good insight into disease process. Alert and Oriented x 3. pleasant and cooperative. Notes My suggestion is gonna be that we go ahead and continue with the above wound care measures for the next week. Patient is in agreement the plan. We will subsequently see were things stand at follow-up. If anything changes or worsens in the meantime he will contact the office and let me know. My hope is that he will heal over the next couple of weeks. Electronic Signature(s) Signed: 12/16/2018 9:19:49 AM By: Lenda Kelp PA-C Entered By: Lenda Kelp on 12/10/2018 10:15:19 Johnathan Scott ([id number]) -------------------------------------------------------------------------------- Physician Orders Details Patient Name: Johnathan Scott Date of Service: 12/09/2018 3:15  PM Medical Record Number: [id number] Patient Account Number: [id number] Date of Birth/Sex: 1966/05/09 (53 y.o. M) Treating RN: Rodell Perna Primary Care Provider: Birdie Sons, ERIC Other Clinician: Referring Provider: Birdie Sons, ERIC Treating Provider/Extender: Linwood Dibbles, Tymier Lindholm Weeks in Treatment: 17 Verbal / Phone Orders: No Diagnosis Coding ICD-10 Coding Code Description I87.2 Venous insufficiency (chronic) (peripheral) L97.822 Non-pressure chronic ulcer of other part of left  lower leg with fat layer exposed D50.9 Iron deficiency anemia, unspecified J45.998 Other asthma Wound Cleansing Wound #1 Left,Anterior Lower Leg o Clean wound with Normal Saline. o May Shower, gently pat wound dry prior to applying new dressing. Anesthetic (add to Medication List) Wound #1 Left,Anterior Lower Leg o Topical Lidocaine 4% cream applied to wound bed prior to debridement (In Clinic Only). Primary Wound Dressing Wound #1 Left,Anterior Lower Leg o Mupirocin Ointment o Silver Alginate Secondary Dressing Wound #1 Left,Anterior Lower Leg o ABD pad Dressing Change Frequency Wound #1 Left,Anterior Lower Leg o Change dressing every week Follow-up Appointments Wound #1 Left,Anterior Lower Leg o Return Appointment in 1 week. Edema Control Wound #1 Left,Anterior Lower Leg o 3 Layer Compression System - Left Lower Extremity o Elevate legs to the level of the heart and pump ankles as often as possible Electronic Signature(s) BIRT, BIEDRZYCKI ([id number]) Signed: 12/10/2018 4:09:31 PM By: Rodell Perna Signed: 12/16/2018 9:19:49 AM By: Lenda Kelp PA-C Entered By: Rodell Perna on 12/09/2018 15:32:56 Johnathan Scott ([id number]) -------------------------------------------------------------------------------- Problem List Details Patient Name: Johnathan Scott Date of Service: 12/09/2018 3:15 PM Medical Record Number: [id number] Patient Account Number: [id number] Date of Birth/Sex: 03-25-1966 (53 y.o. M) Treating RN: Curtis Sites Primary Care Provider: Birdie Sons, ERIC Other Clinician: Referring Provider: Birdie Sons, ERIC Treating Provider/Extender: Linwood Dibbles, Micheal Sheen Weeks in Treatment: 17 Active Problems ICD-10 Evaluated Encounter Code Description Active Date Today Diagnosis I87.2 Venous insufficiency (chronic) (peripheral) 08/09/2018 No Yes L97.822 Non-pressure chronic ulcer of other part of left lower leg with 08/09/2018 No Yes fat layer  exposed D50.9 Iron deficiency anemia, unspecified 08/09/2018 No Yes J45.998 Other asthma 08/09/2018 No Yes Inactive Problems Resolved Problems Electronic Signature(s) Signed: 12/16/2018 9:19:49 AM By: Lenda Kelp PA-C Entered By: Lenda Kelp on 12/09/2018 15:26:50 Johnathan Scott ([id number]) -------------------------------------------------------------------------------- Progress Note Details Patient Name: Johnathan Scott Date of Service: 12/09/2018 3:15 PM Medical Record Number: [id number] Patient Account Number: [id number] Date of Birth/Sex: 07-19-1966 (53 y.o. M) Treating RN: Curtis Sites Primary Care Provider: Birdie Sons, ERIC Other Clinician: Referring Provider: Birdie Sons, ERIC Treating Provider/Extender: Linwood Dibbles, Areen Trautner Weeks in Treatment: 17 Subjective Chief Complaint Information obtained from Patient Left Anterior LE Ulcer History of Present Illness (HPI) 08/09/18 on evaluation today patient presents for initial evaluation and clinic as referral concerning an ulcer that he has on the left anterior lower Trinity which has been present for five months. He states that he actually noticed this after having an "insect bite" while driving down the road. He states that he did not see what kind of insect it was but he felt it wrong up his leg and then have the bite. Since that time is been having issues with the fact that this area does not want to heal. He does have a history of asthma as well as anemia is most recent CBC revealed a hemoglobin of 11.1 with a hematocrit of 34.6. He does have a history of panic attacks as well intermittently. No fevers, chills, nausea, or vomiting noted at this time. Specifically the patient does not appear to have any infection in regard to the left lower extremity. He's  not on the current antibiotics. 08/16/18 upon evaluation today patient actually appears to be doing significantly better in regard to his wound. He has been tolerating the  dressing changes that he was performing on his own over the last week. Subsequently we did not wrap his leg at that time although we discussed it. We weren't sure that he was gonna be able to get his pants on top of the dressing. Nonetheless the patient fortunately came today with shorts on he states that he is willing to be wrapped if it will help the wound to heal faster. He is pleased with the fact that the wound already appears to be doing better it's also not hurting as badly. The last debridement was a little bit more uncomfortable for him unfortunately. Nonetheless he states he's willing to allow me to debride it again today if I do so carefully. 08/24/18 upon evaluation today patient actually appears to be doing rather well in regard to his lower Trinity ulcer on the left. He has been tolerating the dressing changes with Iodoflex including the wrap without complication and the wound [place] into making signs of good progress. Overall I'm very pleased with how things appear at this time. No fevers, chills, nausea, or vomiting noted at this time. 09/03/18 on evaluation today patient actually appears to be doing rather well in regard to his wound. In fact the overall measurement belies the fact that the wound is actually healed quite a bit more than what the measurement would suggest. In fact it's almost half the size that it was during the last evaluation unfortunately a lot of the area where new skin has grown over is in between two areas that had to be clustered together one of the distal portion of the wound has not completely healed up. Nonetheless there is actually quite a bit more healing than what seems to be represented by the wound measurement today. Overall I'm very pleased with the overall progress and how he seems to be doing with the dressing changes. The wrap seems to be of great benefit for him as well. 09/09/18 upon evaluation today patient actually appears to be doing excellent in  regard to his left lotion the ulcer. He has been tolerating the dressing changes without complication. There does not appear to be any evidence of infection overall I think the [facility] Dressing has done excellent for him. I'm very pleased. 09/23/18 evaluation today patient actually appears to be doing poorly in regard to his lower extremity ulcer. There's a lot of maceration and skin breakdown around the actual wound itself which is making it difficult to even tell exactly what is going on. This is something he states started about four days ago. Nonetheless I'm concerned about the possibility of infection just being that he was not having near this amount of drainage previous. He also states in the past 24 hours the pain has been a little bit more significant. No fevers, chills, nausea, or vomiting noted at this time. 09/30/18 on evaluation today patient actually appears to be doing poorly in regard to his lower extremity ulcer. He's been tolerating the dressing changes although there is a lot of tape irritation surrounding the wound area. Subsequently I do think [facility], [place] ([id number]) that he would benefit from [place] initiating treatment with all the compression wrap as we did before to avoid any additional injury to the area. No fevers, chills, nausea, or vomiting noted at this time. The patient did have a return of  his wound culture which showed Pseudomonas which will not be covered by the Bactrim that I previously prescribed for him. With that being said the patient states that he did not know I was sitting in a prescription for him and therefore never picked this up he also states the pharmacy did not call him. Nonetheless this was something that was sent we will call the pharmacy and having counseling as I'm gonna have to send them something different. 10/08/18 on evaluation today patient's ulcer still appears to be much larger than what it was previous to the deterioration.  With that being said the erythema surrounding the wound bed is not nearly as significant I do believe the antibiotic has been of help for him. He did finally get the Levaquin which is good news. No fevers, chills, nausea, or vomiting noted at this time. 10/12/18 on evaluation today patient actually appears to be doing much better in regard to his lower extremity ulcer. Overall I do believe the dressing change has done well for him at this time. I'm pleased with the overall progress even since just in the last week. 10/19/18 Seen today for follow up and management of left lower leg wound. Wound looks to be improving today and measuring smaller. Decreased surrounding erythema. No s/s of infection. Denies any increased pain, fever, or chills. 10/26/18 on evaluation today patient's wound actually appears to be doing very well. Unfortunately he has a new wound on the medial aspect of his lower extremity that is due to the wrap having slid down. He did not contact us as he did not know whether or not we be able to work him in. Nonetheless I told him in the future if this happens the definitely call us and let us know. 11/01/18 on evaluation today patient's lower Trinity ulcer appears to show signs of improvement compared to last week's evaluation. In fact the new area which blistered and arose last week appears to likely be completely healed at this time. Fortunately there is no sign of infection which is also good news. Overall I'm very pleased with how things seem to be progressing. 11/08/18 on evaluation today patient appears to be doing better in regard to his lower extremity ulcer. Fortunately there is no evidence of infection at this time. Overall he seems to be making good progress which is good news. 11/15/18 on evaluation today patient appears to be doing very well at this point in regard to left anterior lower Trinity ulcer. This is not completely healed but does seem to be progressing in an  appropriate fashion at this time. Fortunately there is no sign of infection currently. No fevers, chills, nausea, or vomiting noted at this time. 11/26/2018 Seen today for follow-up and management of left lower leg anterior ulcer. Wound continues to be progressing well. There is no evidence of infection. He did express wanting to end wound care and just to apply a Band-Aid over the wound. Discussed healing process and the need for ongoing compliance with wound care. He denies any immediate concerns during visit. No recent fevers, chills, or shortness of breath. 12/02/18 on evaluation today patient actually appears to be doing very well in regard to his lower extremity ulcer which is shown signs of good improvement. Fortunately there is no sign of infection at this time. Patient has been tolerating the dressing changes without complication. I'm very pleased with how things have been progressing. No fevers, chills, nausea, or vomiting noted at this time. 12/09/18 on evaluation today  patient continues to made excellent progress in regard to his left lower extremity ulcer. He's been tolerating the dressing changes without complication. The wrap does seem to be doing his job and is doing so excellently. The biggest issue is he is having this work to come in to get the wrap change. Fortunately there's no signs of infection at this time which is good news. Patient History Information obtained from Patient. Family History Cancer - Mother,Father,Siblings, Diabetes - Siblings, Heart Disease - Siblings, Hypertension - Siblings, No family history of Hereditary Spherocytosis, Kidney Disease, Lung Disease, Seizures, Stroke, Thyroid Problems, Tuberculosis. Social History SAMMUEL, BLICK ([id number]) Former smoker - 7 years, Marital Status - Single, Alcohol Use - Rarely, Drug Use - No History, Caffeine Use - Never. Medical History Eyes Denies history of Cataracts, Glaucoma, Optic  Neuritis Ear/Nose/Mouth/Throat Denies history of Chronic sinus problems/congestion, Middle ear problems Hematologic/Lymphatic Denies history of Anemia, Hemophilia, Human Immunodeficiency Virus, Lymphedema, Sickle Cell Disease Respiratory Patient has history of Asthma - history as a child Denies history of Aspiration, Chronic Obstructive Pulmonary Disease (COPD), Pneumothorax, Sleep Apnea, Tuberculosis Cardiovascular Denies history of Angina, Arrhythmia, Congestive Heart Failure, Coronary Artery Disease, Deep Vein Thrombosis, Hypertension, Hypotension, Myocardial Infarction, Peripheral Arterial Disease, Peripheral Venous Disease, Phlebitis, Vasculitis Gastrointestinal Denies history of Cirrhosis , Colitis, Crohn s, Hepatitis A, Hepatitis B, Hepatitis C Endocrine Denies history of Type I Diabetes, Type II Diabetes Genitourinary Denies history of End Stage Renal Disease Immunological Denies history of Lupus Erythematosus, Raynaud s, Scleroderma Integumentary (Skin) Denies history of History of Burn, History of pressure wounds Musculoskeletal Patient has history of Osteoarthritis - both hips Denies history of Gout, Rheumatoid Arthritis, Osteomyelitis Neurologic Patient has history of Seizure Disorder - as a child Denies history of Dementia, Neuropathy, Quadriplegia, Paraplegia Oncologic Denies history of Received Chemotherapy, Received Radiation Psychiatric Denies history of Anorexia/bulimia, Confinement Anxiety Review of Systems (ROS) Constitutional Symptoms (General Health) Denies complaints or symptoms of Fever, Chills. Respiratory The patient has no complaints or symptoms. Cardiovascular Complains or has symptoms of LE edema. Psychiatric The patient has no complaints or symptoms. Objective Constitutional Well-nourished and well-hydrated in no acute distress. [place], Maisie Fus ([id number]) Vitals Time Taken: 3:10 PM, Height: 61 in, Weight: 232 lbs, BMI: 43.8, Temperature:  97.6 F, Pulse: 77 bpm, Respiratory Rate: 18 breaths/min, Blood Pressure: 154/67 mmHg. Respiratory normal breathing without difficulty. clear to auscultation bilaterally. Cardiovascular regular rate and rhythm with normal S1, S2. Psychiatric this patient is able to make decisions and demonstrates good insight into disease process. Alert and Oriented x 3. pleasant and cooperative. General Notes: My suggestion is gonna be that we go ahead and continue with the above wound care measures for the next week. Patient is in agreement the plan. We will subsequently see were things stand at follow-up. If anything changes or worsens in the meantime he will contact the office and let me know. My hope is that he will heal over the next couple of weeks. Integumentary (Hair, Skin) Wound #1 status is Open. Original cause of wound was Gradually Appeared. The wound is located on the Left,Anterior Lower Leg. The wound measures 0.7cm length x 0.5cm width x 0.1cm depth; 0.275cm^2 area and 0.027cm^3 volume. There is Fat Layer (Subcutaneous Tissue) Exposed exposed. There is no tunneling or undermining noted. There is a small amount of serous drainage noted. The wound margin is flat and intact. There is no granulation within the wound bed. There is a medium (34-66%) amount of necrotic tissue within the wound bed including Eschar and  Adherent Slough. The periwound skin appearance exhibited: Dry/Scaly, Hemosiderin Staining. The periwound skin appearance did not exhibit: Callus, Crepitus, Excoriation, Induration, Rash, Scarring, Maceration, Atrophie Blanche, Cyanosis, Ecchymosis, Mottled, Pallor, Rubor, Erythema. Periwound temperature was noted as No Abnormality. Assessment Active Problems ICD-10 Venous insufficiency (chronic) (peripheral) Non-pressure chronic ulcer of other part of left lower leg with fat layer exposed Iron deficiency anemia, unspecified Other asthma Procedures Wound #1 Pre-procedure diagnosis  of Wound #1 is a Venous Leg Ulcer located on the Left,Anterior Lower Leg . There was a Three Layer Compression Therapy Procedure with a pre-treatment ABI of 1.2 by Rodell Perna, RN. Post procedure Diagnosis Wound #1: Same as Pre-Procedure PLEZ, BELTON ([id number]) Plan Wound Cleansing: Wound #1 Left,Anterior Lower Leg: Clean wound with Normal Saline. May Shower, gently pat wound dry prior to applying new dressing. Anesthetic (add to Medication List): Wound #1 Left,Anterior Lower Leg: Topical Lidocaine 4% cream applied to wound bed prior to debridement (In Clinic Only). Primary Wound Dressing: Wound #1 Left,Anterior Lower Leg: Mupirocin Ointment Silver Alginate Secondary Dressing: Wound #1 Left,Anterior Lower Leg: ABD pad Dressing Change Frequency: Wound #1 Left,Anterior Lower Leg: Change dressing every week Follow-up Appointments: Wound #1 Left,Anterior Lower Leg: Return Appointment in 1 week. Edema Control: Wound #1 Left,Anterior Lower Leg: 3 Layer Compression System - Left Lower Extremity Elevate legs to the level of the heart and pump ankles as often as possible I'm in a recommend that we continue with the above wound care measures for the next week. The patient is in agreement the plan. Subsequently we're gonna see were things stand at follow-up. Please see above for specific wound care orders. We will see patient for re-evaluation in 1 week(s) here in the clinic. If anything worsens or changes patient will contact our office for additional recommendations. Electronic Signature(s) Signed: 12/16/2018 9:19:49 AM By: Lenda Kelp PA-C Entered By: Lenda Kelp on 12/10/2018 10:15:35 Johnathan Scott ([id number]) -------------------------------------------------------------------------------- ROS/PFSH Details Patient Name: Johnathan Scott Date of Service: 12/09/2018 3:15 PM Medical Record Number: [id number] Patient Account Number: [id number] Date of Birth/Sex: 07/27/1966  (53 y.o. M) Treating RN: Curtis Sites Primary Care Provider: Birdie Sons, ERIC Other Clinician: Referring Provider: Birdie Sons, ERIC Treating Provider/Extender: Linwood Dibbles, Taija Mathias Weeks in Treatment: 17 Information Obtained From Patient Wound History Do you currently have one or more open woundso Yes How many open wounds do you currently haveo 1 Approximately how long have you had your woundso 5 months How have you been treating your wound(s) until nowo open to air Has your wound(s) ever healed and then re-openedo No Have you had any lab work done in the past montho No Have you tested positive for an antibiotic resistant organism (MRSA, VRE)o No Have you tested positive for osteomyelitis (bone infection)o No Have you had any tests for circulation on your legso No Have you had other problems associated with your woundso Swelling Constitutional Symptoms (General Health) Complaints and Symptoms: Negative for: Fever; Chills Cardiovascular Complaints and Symptoms: Positive for: LE edema Medical History: Negative for: Angina; Arrhythmia; Congestive Heart Failure; Coronary Artery Disease; Deep Vein Thrombosis; Hypertension; Hypotension; Myocardial Infarction; Peripheral Arterial Disease; Peripheral Venous Disease; Phlebitis; Vasculitis Eyes Medical History: Negative for: Cataracts; Glaucoma; Optic Neuritis Ear/Nose/Mouth/Throat Medical History: Negative for: Chronic sinus problems/congestion; Middle ear problems Hematologic/Lymphatic Medical History: Negative for: Anemia; Hemophilia; Human Immunodeficiency Virus; Lymphedema; Sickle Cell Disease Respiratory Complaints and Symptoms: No Complaints or Symptoms RHYTHM, WIGFALL ([id number]) Medical History: Positive for: Asthma - history as a child Negative for: Aspiration; Chronic Obstructive Pulmonary Disease (  COPD); Pneumothorax; Sleep Apnea; Tuberculosis Gastrointestinal Medical History: Negative for: Cirrhosis ; Colitis; Crohnos;  Hepatitis A; Hepatitis B; Hepatitis C Endocrine Medical History: Negative for: Type I Diabetes; Type II Diabetes Genitourinary Medical History: Negative for: End Stage Renal Disease Immunological Medical History: Negative for: Lupus Erythematosus; Raynaudos; Scleroderma Integumentary (Skin) Medical History: Negative for: History of Burn; History of pressure wounds Musculoskeletal Medical History: Positive for: Osteoarthritis - both hips Negative for: Gout; Rheumatoid Arthritis; Osteomyelitis Neurologic Medical History: Positive for: Seizure Disorder - as a child Negative for: Dementia; Neuropathy; Quadriplegia; Paraplegia Oncologic Medical History: Negative for: Received Chemotherapy; Received Radiation Psychiatric Complaints and Symptoms: No Complaints or Symptoms Medical History: Negative for: Anorexia/bulimia; Confinement Anxiety Immunizations Pneumococcal Vaccine: Received Pneumococcal Vaccination: No Implantable Devices PHILLP, DOLORES ([id number]) Family and Social History Cancer: Yes - Mother,Father,Siblings; Diabetes: Yes - Siblings; Heart Disease: Yes - Siblings; Hereditary Spherocytosis: No; Hypertension: Yes - Siblings; Kidney Disease: No; Lung Disease: No; Seizures: No; Stroke: No; Thyroid Problems: No; Tuberculosis: No; Former smoker - 7 years; Marital Status - Single; Alcohol Use: Rarely; Drug Use: No History; Caffeine Use: Never; Financial Concerns: No; Food, Clothing or Shelter Needs: No; Support System Lacking: No; Transportation Concerns: No; Advanced Directives: No; Patient does not want information on Advanced Directives Physician Affirmation I have reviewed and agree with the above information. Electronic Signature(s) Signed: 12/10/2018 4:37:43 PM By: Curtis Sites Signed: 12/16/2018 9:19:49 AM By: Lenda Kelp PA-C Entered By: Lenda Kelp on 12/10/2018 10:14:00 Johnathan Scott  ([id number]) -------------------------------------------------------------------------------- SuperBill Details Patient Name: Johnathan Scott Date of Service: 12/09/2018 Medical Record Number: [id number] Patient Account Number: [id number] Date of Birth/Sex: Aug 31, 1966 (53 y.o. M) Treating RN: Rodell Perna Primary Care Provider: Birdie Sons, ERIC Other Clinician: Referring Provider: Birdie Sons, ERIC Treating Provider/Extender: Linwood Dibbles, Andrea Colglazier Weeks in Treatment: 17 Diagnosis Coding ICD-10 Codes Code Description I87.2 Venous insufficiency (chronic) (peripheral) L97.822 Non-pressure chronic ulcer of other part of left lower leg with fat layer exposed D50.9 Iron deficiency anemia, unspecified J45.998 Other asthma Facility Procedures CPT4 Code: 08657846 Description: (Facility Use Only) [phone] - APPLY MULTLAY COMPRS LWR LT LEG Modifier: Quantity: 1 Physician Procedures CPT4 Code Description: [id number] - WC PHYS LEVEL 4 - EST PT ICD-10 Diagnosis Description I87.2 Venous insufficiency (chronic) (peripheral) L97.822 Non-pressure chronic ulcer of other part of left lower leg wit D50.9 Iron deficiency anemia,  unspecified J45.998 Other asthma Modifier: h fat layer expos Quantity: 1 ed Electronic Signature(s) Signed: 12/16/2018 9:19:49 AM By: Lenda Kelp PA-C Entered By: Lenda Kelp on 12/09/2018 23:53:30

## 2018-12-18 ENCOUNTER — Other Ambulatory Visit: Payer: Self-pay | Admitting: Family Medicine

## 2018-12-18 DIAGNOSIS — I10 Essential (primary) hypertension: Secondary | ICD-10-CM

## 2018-12-18 NOTE — Progress Notes (Signed)
Johnathan, Scott ([id number]) Visit Report for 12/16/2018 Arrival Information Details Patient Name: Johnathan Scott, Johnathan Scott Date of Service: 12/16/2018 3:15 PM Medical Record Number: [id number] Patient Account Number: [id number] Date of Birth/Sex: [birth year] (53 y.o. M) Treating RN: Curtis Sites Primary Care Abegail Kloeppel: Birdie Sons, ERIC Other Clinician: Referring Deidrick Rainey: Birdie Sons, ERIC Treating Zoua Caporaso/Extender: Linwood Dibbles, HOYT Weeks in Treatment: 18 Visit Information History Since Last Visit Added or deleted any medications: No Patient Arrived: Cane Any new allergies or adverse reactions: No Arrival Time: 15:28 Had a fall or experienced change in No Accompanied By: self activities of daily living that may affect Transfer Assistance: None risk of falls: Patient Identification Verified: Yes Signs or symptoms of abuse/neglect since last visito No Secondary Verification Process Yes Hospitalized since last visit: No Completed: Implantable device outside of the clinic excluding No Patient Has Alerts: Yes cellular tissue based products placed in the center Patient Alerts: PT REFUSED FACE since last visit: PHOTO Has Dressing in Place as Prescribed: Yes Has Compression in Place as Prescribed: Yes Pain Present Now: No Electronic Signature(s) Signed: 12/16/2018 4:28:00 PM By: Curtis Sites Entered By: Curtis Sites on 12/16/2018 15:28:47 Johnathan Scott ([id number]) -------------------------------------------------------------------------------- Compression Therapy Details Patient Name: Johnathan Scott Date of Service: 12/16/2018 3:15 PM Medical Record Number: [id number] Patient Account Number: [id number] Date of Birth/Sex: 01-19-1966 (53 y.o. M) Treating RN: Rodell Perna Primary Care Emmogene Simson: Birdie Sons ERIC Other Clinician: Referring Anjela Cassara: Birdie Sons, ERIC Treating Macaela Presas/Extender: Linwood Dibbles, HOYT Weeks in Treatment: 18 Compression Therapy Performed for Wound Assessment:  Wound #1 Left,Anterior Lower Leg Performed By: Clinician Rodell Perna, RN Compression Type: Three Layer Pre Treatment ABI: 1.2 Post Procedure Diagnosis Same as Pre-procedure Electronic Signature(s) Signed: 12/16/2018 4:30:00 PM By: Rodell Perna Entered By: Rodell Perna on 12/16/2018 16:30:00 Johnathan Scott ([id number]) -------------------------------------------------------------------------------- Encounter Discharge Information Details Patient Name: Johnathan Scott Date of Service: 12/16/2018 3:15 PM Medical Record Number: [id number] Patient Account Number: [id number] Date of Birth/Sex: Nov 20, 1965 (53 y.o. M) Treating RN: Huel Coventry Primary Care Lesbia Ottaway: Birdie Sons, ERIC Other Clinician: Referring Madylyn Insco: Birdie Sons, ERIC Treating Jameah Rouser/Extender: Linwood Dibbles, HOYT Weeks in Treatment: 18 Encounter Discharge Information Items Discharge Condition: Stable Ambulatory Status: Ambulatory Discharge Destination: Home Transportation: Private Auto Accompanied By: self Schedule Follow-up Appointment: Yes Clinical Summary of Care: Electronic Signature(s) Signed: 12/16/2018 5:01:16 PM By: Elliot Gurney, BSN, RN, CWS, Kim RN, BSN Entered By: Elliot Gurney, BSN, RN, CWS, Kim on 12/16/2018 16:06:20 Johnathan Scott ([id number]) -------------------------------------------------------------------------------- Lower Extremity Assessment Details Patient Name: Johnathan Scott Date of Service: 12/16/2018 3:15 PM Medical Record Number: [id number] Patient Account Number: [id number] Date of Birth/Sex: 1966-03-08 (53 y.o. M) Treating RN: Curtis Sites Primary Care Rohin Krejci: Birdie Sons, ERIC Other Clinician: Referring Morley Gaumer: Birdie Sons, ERIC Treating Jamelia Varano/Extender: Linwood Dibbles, HOYT Weeks in Treatment: 18 Edema Assessment Assessed: [Left: No] [Right: No] Edema: [Left: Ye] [Right: s] Calf Left: Right: Point of Measurement: 29 cm From Medial Instep 49 cm cm Ankle Left: Right: Point of Measurement: 12 cm  From Medial Instep 27.5 cm cm Vascular Assessment Pulses: Dorsalis Pedis Palpable: [Left:Yes] Posterior Tibial Extremity colors, hair growth, and conditions: Extremity Color: [Left:Hyperpigmented] Hair Growth on Extremity: [Left:No] Temperature of Extremity: [Left:Warm] Capillary Refill: [Left:< 3 seconds] Toe Nail Assessment Left: Right: Thick: Yes Discolored: Yes Deformed: Yes Improper Length and Hygiene: Yes Electronic Signature(s) Signed: 12/16/2018 4:28:00 PM By: Curtis Sites Entered By: Curtis Sites on 12/16/2018 15:37:06 Johnathan Scott ([id number]) -------------------------------------------------------------------------------- Multi Wound Chart Details Patient Name: Johnathan Scott Date of Service: 12/16/2018 3:15 PM Medical Record Number: [id number] Patient Account Number: [id number] Date of Birth/Sex: March 25, 1966 (52 y.o.  M) Treating RN: Rodell Perna Primary Care Orlene Salmons: Birdie Sons, ERIC Other Clinician: Referring Daltin Crist: Birdie Sons, ERIC Treating Aleesa Sweigert/Extender: Linwood Dibbles, HOYT Weeks in Treatment: 18 Vital Signs Height(in): 61 Pulse(bpm): 59 Weight(lbs): 232 Blood Pressure(mmHg): 133/65 Body Mass Index(BMI): 44 Temperature(F): 98.1 Respiratory Rate 16 (breaths/min): Photos: [1:No Photos] [N/A:N/A] Wound Location: [1:Left Lower Leg - Anterior] [N/A:N/A] Wounding Event: [1:Gradually Appeared] [N/A:N/A] Primary Etiology: [1:Venous Leg Ulcer] [N/A:N/A] Comorbid History: [1:Asthma, Osteoarthritis, Seizure Disorder] [N/A:N/A] Date Acquired: [1:03/08/2018] [N/A:N/A] Weeks of Treatment: [1:18] [N/A:N/A] Wound Status: [1:Open] [N/A:N/A] Measurements L x W x D [1:0.3x0.3x0.1] [N/A:N/A] (cm) Area (cm) : [1:0.071] [N/A:N/A] Volume (cm) : [1:0.007] [N/A:N/A] % Reduction in Area: [1:99.30%] [N/A:N/A] % Reduction in Volume: [1:99.60%] [N/A:N/A] Classification: [1:Full Thickness Without Exposed Support Structures] [N/A:N/A] Exudate Amount: [1:Small]  [N/A:N/A] Exudate Type: [1:Serous] [N/A:N/A] Exudate Color: [1:amber] [N/A:N/A] Wound Margin: [1:Flat and Intact] [N/A:N/A] Granulation Amount: [1:Large (67-100%)] [N/A:N/A] Granulation Quality: [1:Red] [N/A:N/A] Necrotic Amount: [1:None Present (0%)] [N/A:N/A] Exposed Structures: [1:Fat Layer (Subcutaneous Tissue) Exposed: Yes Fascia: No Tendon: No Muscle: No Joint: No Bone: No] [N/A:N/A] Epithelialization: [1:Medium (34-66%)] [N/A:N/A] Periwound Skin Texture: [1:Excoriation: No Induration: No Callus: No Crepitus: No] [N/A:N/A] Rash: No Scarring: No Periwound Skin Moisture: Dry/Scaly: Yes N/A N/A Maceration: No Periwound Skin Color: Hemosiderin Staining: Yes N/A N/A Atrophie Blanche: No Cyanosis: No Ecchymosis: No Erythema: No Mottled: No Pallor: No Rubor: No Temperature: No Abnormality N/A N/A Tenderness on Palpation: No N/A N/A Wound Preparation: Ulcer Cleansing: Other: soap N/A N/A and water Topical Anesthetic Applied: Other: lidocaine 4% Treatment Notes Electronic Signature(s) Signed: 12/17/2018 12:47:49 PM By: Rodell Perna Entered By: Rodell Perna on 12/16/2018 15:49:09 Johnathan Scott ([id number]) -------------------------------------------------------------------------------- Multi-Disciplinary Care Plan Details Patient Name: Johnathan Scott Date of Service: 12/16/2018 3:15 PM Medical Record Number: [id number] Patient Account Number: [id number] Date of Birth/Sex: 1966/02/08 (53 y.o. M) Treating RN: Rodell Perna Primary Care Golda Zavalza: Birdie Sons, ERIC Other Clinician: Referring Sharia Averitt: Birdie Sons, ERIC Treating Evalynn Hankins/Extender: Linwood Dibbles, HOYT Weeks in Treatment: 18 Active Inactive Necrotic Tissue Nursing Diagnoses: Knowledge deficit related to management of necrotic/devitalized tissue Goals: Necrotic/devitalized tissue will be minimized in the wound bed Date Initiated: 08/09/2018 Target Resolution Date: 09/09/2018 Goal Status:  Active Interventions: Assess patient pain level pre-, during and post procedure and prior to discharge Treatment Activities: Apply topical anesthetic as ordered : 08/09/2018 Excisional debridement : 08/09/2018 Notes: Orientation to the Wound Care Program Nursing Diagnoses: Knowledge deficit related to the wound healing center program Goals: Patient/caregiver will verbalize understanding of the Wound Healing Center Program Date Initiated: 08/09/2018 Target Resolution Date: 09/09/2018 Goal Status: Active Interventions: Provide education on orientation to the wound center Notes: Wound/Skin Impairment Nursing Diagnoses: Impaired tissue integrity Knowledge deficit related to smoking impact on wound healing Goals: Ulcer/skin breakdown will have a volume reduction of 30% by week 4 BERGE, BOURNES ([id number]) Date Initiated: 08/09/2018 Target Resolution Date: 09/09/2018 Goal Status: Active Interventions: Assess patient/caregiver ability to obtain necessary supplies Assess ulceration(s) every visit Treatment Activities: Topical wound management initiated : 08/09/2018 Notes: Electronic Signature(s) Signed: 12/17/2018 12:47:49 PM By: Rodell Perna Entered By: Rodell Perna on 12/16/2018 15:49:00 Johnathan Scott ([id number]) -------------------------------------------------------------------------------- Pain Assessment Details Patient Name: Johnathan Scott Date of Service: 12/16/2018 3:15 PM Medical Record Number: [id number] Patient Account Number: [id number] Date of Birth/Sex: 05-20-1966 (53 y.o. M) Treating RN: Curtis Sites Primary Care Ariv Penrod: Birdie Sons, ERIC Other Clinician: Referring Leona Pressly: Birdie Sons, ERIC Treating Wyatt Thorstenson/Extender: Linwood Dibbles, HOYT Weeks in Treatment: 18 Active Problems Location of Pain Severity and Description of Pain Patient Has Paino No Site Locations Pain Management and  Medication Current Pain Management: Electronic Signature(s) Signed:  12/16/2018 4:28:00 PM By: Curtis Sites Entered By: Curtis Sites on 12/16/2018 15:30:19 Johnathan Scott ([id number]) -------------------------------------------------------------------------------- Patient/Caregiver Education Details Patient Name: Johnathan Scott Date of Service: 12/16/2018 3:15 PM Medical Record Number: [id number] Patient Account Number: [id number] Date of Birth/Gender: 01-03-1966 (53 y.o. M) Treating RN: Rodell Perna Primary Care Physician: Birdie Sons, ERIC Other Clinician: Referring Physician: Birdie Sons, ERIC Treating Physician/Extender: Skeet Simmer in Treatment: 18 Education Assessment Education Provided To: Patient Education Topics Provided Wound/Skin Impairment: Handouts: Caring for Your Ulcer Methods: Demonstration, Explain/Verbal Responses: State content correctly Electronic Signature(s) Signed: 12/17/2018 12:47:49 PM By: Rodell Perna Entered By: Rodell Perna on 12/16/2018 15:55:40 Johnathan Scott ([id number]) -------------------------------------------------------------------------------- Wound Assessment Details Patient Name: Johnathan Scott Date of Service: 12/16/2018 3:15 PM Medical Record Number: [id number] Patient Account Number: [id number] Date of Birth/Sex: [birth year] (53 y.o. M) Treating RN: Curtis Sites Primary Care Karter Haire: Birdie Sons, ERIC Other Clinician: Referring Ellison Leisure: Birdie Sons, ERIC Treating Skylier Kretschmer/Extender: Linwood Dibbles, HOYT Weeks in Treatment: 18 Wound Status Wound Number: 1 Primary Etiology: Venous Leg Ulcer Wound Location: Left Lower Leg - Anterior Wound Status: Open Wounding Event: Gradually Appeared Comorbid History: Asthma, Osteoarthritis, Seizure Disorder Date Acquired: 03/08/2018 Weeks Of Treatment: 18 Clustered Wound: No Photos Photo Uploaded By: Elliot Gurney, BSN, RN, CWS, Kim on 12/16/2018 16:50:03 Wound Measurements Length: (cm) 0.3 Width: (cm) 0.3 Depth: (cm) 0.1 Area: (cm) 0.071 Volume: (cm)  0.007 % Reduction in Area: 99.3% % Reduction in Volume: 99.6% Epithelialization: Medium (34-66%) Tunneling: No Undermining: No Wound Description Full Thickness Without Exposed Support Foul Odo Classification: Structures Slough/F Wound Margin: Flat and Intact Exudate Small Amount: Exudate Type: Serous Exudate Color: amber r After Cleansing: No ibrino Yes Wound Bed Granulation Amount: Large (67-100%) Exposed Structure Granulation Quality: Red Fascia Exposed: No Necrotic Amount: None Present (0%) Fat Layer (Subcutaneous Tissue) Exposed: Yes Tendon Exposed: No Muscle Exposed: No Joint Exposed: No Bone Exposed: No Wenner, Tilman ([id number]) Periwound Skin Texture Texture Color No Abnormalities Noted: No No Abnormalities Noted: No Callus: No Atrophie Blanche: No Crepitus: No Cyanosis: No Excoriation: No Ecchymosis: No Induration: No Erythema: No Rash: No Hemosiderin Staining: Yes Scarring: No Mottled: No Pallor: No Moisture Rubor: No No Abnormalities Noted: No Dry / Scaly: Yes Temperature / Pain Maceration: No Temperature: No Abnormality Wound Preparation Ulcer Cleansing: Other: soap and water, Topical Anesthetic Applied: Other: lidocaine 4%, Treatment Notes Wound #1 (Left, Anterior Lower Leg) Notes mupirocin, silvercel, abd, 3 layer wrap with unna to anchor Electronic Signature(s) Signed: 12/16/2018 4:28:00 PM By: Curtis Sites Entered By: Curtis Sites on 12/16/2018 15:41:26 Johnathan Scott ([id number]) -------------------------------------------------------------------------------- Vitals Details Patient Name: Johnathan Scott Date of Service: 12/16/2018 3:15 PM Medical Record Number: [id number] Patient Account Number: [id number] Date of Birth/Sex: 26-May-1966 (53 y.o. M) Treating RN: Curtis Sites Primary Care Kemya Shed: Birdie Sons, ERIC Other Clinician: Referring Kasarah Sitts: Birdie Sons, ERIC Treating Dioselina Brumbaugh/Extender: Linwood Dibbles, HOYT Weeks in  Treatment: 18 Vital Signs Time Taken: 15:31 Temperature (F): 98.1 Height (in): 61 Pulse (bpm): 59 Weight (lbs): 232 Respiratory Rate (breaths/min): 16 Body Mass Index (BMI): 43.8 Blood Pressure (mmHg): 133/65 Reference Range: 80 - 120 mg / dl Electronic Signature(s) Signed: 12/16/2018 4:28:00 PM By: Curtis Sites Entered By: Curtis Sites on 12/16/2018 15:32:08

## 2018-12-18 NOTE — Progress Notes (Signed)
TRAYSHAWN, DURKIN ([id number]) Visit Report for 12/16/2018 Chief Complaint Document Details Patient Name: Johnathan Scott, Johnathan Scott Date of Service: 12/16/2018 3:15 PM Medical Record Number: [id number] Patient Account Number: [id number] Date of Birth/Sex: 10-01-1966 (53 y.o. M) Treating RN: Rodell Perna Primary Care Provider: Birdie Sons ERIC Other Clinician: Referring Provider: Birdie Sons, ERIC Treating Provider/Extender: Linwood Dibbles, HOYT Weeks in Treatment: 18 Information Obtained from: Patient Chief Complaint Left Anterior LE Ulcer Electronic Signature(s) Signed: 12/16/2018 5:10:06 PM By: Lenda Kelp PA-C Entered By: Lenda Kelp on 12/16/2018 14:47:08 Johnathan Scott ([id number]) -------------------------------------------------------------------------------- HPI Details Patient Name: Johnathan Scott Date of Service: 12/16/2018 3:15 PM Medical Record Number: [id number] Patient Account Number: [id number] Date of Birth/Sex: Apr 07, 1966 (53 y.o. M) Treating RN: Rodell Perna Primary Care Provider: Birdie Sons ERIC Other Clinician: Referring Provider: Birdie Sons, ERIC Treating Provider/Extender: Linwood Dibbles, HOYT Weeks in Treatment: 18 History of Present Illness HPI Description: 08/09/18 on evaluation today patient presents for initial evaluation and clinic as referral concerning an ulcer that he has on the left anterior lower Trinity which has been present for five months. He states that he actually noticed this after having an "insect bite" while driving down the road. He states that he did not see what kind of insect it was but he felt it wrong up his leg and then have the bite. Since that time is been having issues with the fact that this area does not want to heal. He does have a history of asthma as well as anemia is most recent CBC revealed a hemoglobin of 11.1 with a hematocrit of 34.6. He does have a history of panic attacks as well intermittently. No fevers, chills, nausea, or vomiting  noted at this time. Specifically the patient does not appear to have any infection in regard to the left lower extremity. He's not on the current antibiotics. 08/16/18 upon evaluation today patient actually appears to be doing significantly better in regard to his wound. He has been tolerating the dressing changes that he was performing on his own over the last week. Subsequently we did not wrap his leg at that time although we discussed it. We weren't sure that he was gonna be able to get his pants on top of the dressing. Nonetheless the patient fortunately came today with shorts on he states that he is willing to be wrapped if it will help the wound to heal faster. He is pleased with the fact that the wound already appears to be doing better it's also not hurting as badly. The last debridement was a little bit more uncomfortable for him unfortunately. Nonetheless he states he's willing to allow me to debride it again today if I do so carefully. 08/24/18 upon evaluation today patient actually appears to be doing rather well in regard to his lower Trinity ulcer on the left. He has been tolerating the dressing changes with Iodoflex including the wrap without complication and the wound [place] into making signs of good progress. Overall I'm very pleased with how things appear at this time. No fevers, chills, nausea, or vomiting noted at this time. 09/03/18 on evaluation today patient actually appears to be doing rather well in regard to his wound. In fact the overall measurement belies the fact that the wound is actually healed quite a bit more than what the measurement would suggest. In fact it's almost half the size that it was during the last evaluation unfortunately a lot of the area where new skin has grown over is in between two areas that had to  be clustered together one of the distal portion of the wound has not completely healed up. Nonetheless there is actually quite a bit more healing than what  seems to be represented by the wound measurement today. Overall I'm very pleased with the overall progress and how he seems to be doing with the dressing changes. The wrap seems to be of great benefit for him as well. 09/09/18 upon evaluation today patient actually appears to be doing excellent in regard to his left lotion the ulcer. He has been tolerating the dressing changes without complication. There does not appear to be any evidence of infection overall I think the [facility] Dressing has done excellent for him. I'm very pleased. 09/23/18 evaluation today patient actually appears to be doing poorly in regard to his lower extremity ulcer. There's a lot of maceration and skin breakdown around the actual wound itself which is making it difficult to even tell exactly what is going on. This is something he states started about four days ago. Nonetheless I'm concerned about the possibility of infection just being that he was not having near this amount of drainage previous. He also states in the past 24 hours the pain has been a little bit more significant. No fevers, chills, nausea, or vomiting noted at this time. 09/30/18 on evaluation today patient actually appears to be doing poorly in regard to his lower extremity ulcer. He's been tolerating the dressing changes although there is a lot of tape irritation surrounding the wound area. Subsequently I do think that he would benefit from [place] initiating treatment with all the compression wrap as we did before to avoid any additional injury to the area. No fevers, chills, nausea, or vomiting noted at this time. The patient did have a return of his wound culture which showed Pseudomonas which will not be covered by the Bactrim that I previously prescribed for him. With that being said the patient states that he did not know I was sitting in a prescription for him and therefore never picked this up he also states the pharmacy did not call him.  Nonetheless this was something that was sent we will call the pharmacy and having counseling as [name], [name] ([id number]) I'm gonna have to send them something different. 10/08/18 on evaluation today patient's ulcer still appears to be much larger than what it was previous to the deterioration. With that being said the erythema surrounding the wound bed is not nearly as significant I do believe the antibiotic has been of help for him. He did finally get the Levaquin which is good news. No fevers, chills, nausea, or vomiting noted at this time. 10/12/18 on evaluation today patient actually appears to be doing much better in regard to his lower extremity ulcer. Overall I do believe the dressing change has done well for him at this time. I'm pleased with the overall progress even since just in the last week. 10/19/18 Seen today for follow up and management of left lower leg wound. Wound looks to be improving today and measuring smaller. Decreased surrounding erythema. No s/s of infection. Denies any increased pain, fever, or chills. 10/26/18 on evaluation today patient's wound actually appears to be doing very well. Unfortunately he has a new wound on the medial aspect of his lower extremity that is due to the wrap having slid down. He did not contact us as he did not know whether or not we be able to work him in. Nonetheless I told him in the future if  this happens the definitely call us and let us know. 11/01/18 on evaluation today patient's lower Trinity ulcer appears to show signs of improvement compared to last week's evaluation. In fact the new area which blistered and arose last week appears to likely be completely healed at this time. Fortunately there is no sign of infection which is also good news. Overall I'm very pleased with how things seem to be progressing. 11/08/18 on evaluation today patient appears to be doing better in regard to his lower extremity ulcer. Fortunately there is  no evidence of infection at this time. Overall he seems to be making good progress which is good news. 11/15/18 on evaluation today patient appears to be doing very well at this point in regard to left anterior lower Trinity ulcer. This is not completely healed but does seem to be progressing in an appropriate fashion at this time. Fortunately there is no sign of infection currently. No fevers, chills, nausea, or vomiting noted at this time. 11/26/2018 Seen today for follow-up and management of left lower leg anterior ulcer. Wound continues to be progressing well. There is no evidence of infection. He did express wanting to end wound care and just to apply a Band-Aid over the wound. Discussed healing process and the need for ongoing compliance with wound care. He denies any immediate concerns during visit. No recent fevers, chills, or shortness of breath. 12/02/18 on evaluation today patient actually appears to be doing very well in regard to his lower extremity ulcer which is shown signs of good improvement. Fortunately there is no sign of infection at this time. Patient has been tolerating the dressing changes without complication. I'm very pleased with how things have been progressing. No fevers, chills, nausea, or vomiting noted at this time. 12/09/18 on evaluation today patient continues to made excellent progress in regard to his left lower extremity ulcer. He's been tolerating the dressing changes without complication. The wrap does seem to be doing his job and is doing so excellently. The biggest issue is he is having this work to come in to get the wrap change. Fortunately there's no signs of infection at this time which is good news. 12/16/18 on evaluation today patient actually appears to be doing excellent in regard to his lower extremity ulcer. Fact this appears to be almost completely healed. There's just a very small opening still remaining the upper portion of the wound otherwise  everything else shows to be closed and there is significant epithelialization noted in general. I'm overall very pleased with how things seem to be improving. I think that he will be healed by next week. Electronic Signature(s) Signed: 12/16/2018 5:10:06 PM By: Lenda Kelp PA-C Entered By: Lenda Kelp on 12/16/2018 16:01:42 JACKSYN, BEEKS ([id number]) -------------------------------------------------------------------------------- Physical Exam Details Patient Name: Johnathan Scott Date of Service: 12/16/2018 3:15 PM Medical Record Number: [id number] Patient Account Number: [id number] Date of Birth/Sex: 1966-05-17 (53 y.o. M) Treating RN: Rodell Perna Primary Care Provider: Birdie Sons, ERIC Other Clinician: Referring Provider: Birdie Sons, ERIC Treating Provider/Extender: STONE III, HOYT Weeks in Treatment: 18 Constitutional Well-nourished and well-hydrated in no acute distress. Respiratory normal breathing without difficulty. clear to auscultation bilaterally. Cardiovascular regular rate and rhythm with normal S1, S2. trace pitting edema of the bilateral lower extremities. Psychiatric this patient is able to make decisions and demonstrates good insight into disease process. Alert and Oriented x 3. pleasant and cooperative. Notes Patient's wound bed currently shows signs of a little bit of dry skin around the edge of  the wound which was mechanically debrided away with saline and gauze there is a very small opening central although this is very tiny I think it's gonna be completely healed by next week. Electronic Signature(s) Signed: 12/16/2018 5:10:06 PM By: Lenda Kelp PA-C Entered By: Lenda Kelp on 12/16/2018 16:02:12 Johnathan Scott ([id number]) -------------------------------------------------------------------------------- Physician Orders Details Patient Name: Johnathan Scott Date of Service: 12/16/2018 3:15 PM Medical Record Number: [id number] Patient Account  Number: [id number] Date of Birth/Sex: 1966/06/24 (53 y.o. M) Treating RN: Rodell Perna Primary Care Provider: Birdie Sons, ERIC Other Clinician: Referring Provider: Birdie Sons, ERIC Treating Provider/Extender: Linwood Dibbles, HOYT Weeks in Treatment: 18 Verbal / Phone Orders: No Diagnosis Coding ICD-10 Coding Code Description I87.2 Venous insufficiency (chronic) (peripheral) L97.822 Non-pressure chronic ulcer of other part of left lower leg with fat layer exposed D50.9 Iron deficiency anemia, unspecified J45.998 Other asthma Wound Cleansing Wound #1 Left,Anterior Lower Leg o Clean wound with Normal Saline. o May Shower, gently pat wound dry prior to applying new dressing. Anesthetic (add to Medication List) Wound #1 Left,Anterior Lower Leg o Topical Lidocaine 4% cream applied to wound bed prior to debridement (In Clinic Only). Primary Wound Dressing Wound #1 Left,Anterior Lower Leg o Mupirocin Ointment o Silver Alginate Secondary Dressing Wound #1 Left,Anterior Lower Leg o ABD pad Dressing Change Frequency Wound #1 Left,Anterior Lower Leg o Change dressing every week Follow-up Appointments Wound #1 Left,Anterior Lower Leg o Return Appointment in 1 week. Edema Control Wound #1 Left,Anterior Lower Leg o 3 Layer Compression System - Left Lower Extremity o Elevate legs to the level of the heart and pump ankles as often as possible Electronic Signature(s) HEYDEN, JABER ([id number]) Signed: 12/16/2018 5:10:06 PM By: Lenda Kelp PA-C Signed: 12/17/2018 12:47:49 PM By: Rodell Perna Entered By: Rodell Perna on 12/16/2018 15:54:54 Johnathan Scott ([id number]) -------------------------------------------------------------------------------- Problem List Details Patient Name: Johnathan Scott Date of Service: 12/16/2018 3:15 PM Medical Record Number: [id number] Patient Account Number: [id number] Date of Birth/Sex: Apr 20, 1966 (53 y.o. M) Treating RN: Rodell Perna Primary Care Provider: Birdie Sons, ERIC Other Clinician: Referring Provider: Birdie Sons, ERIC Treating Provider/Extender: Linwood Dibbles, HOYT Weeks in Treatment: 18 Active Problems ICD-10 Evaluated Encounter Code Description Active Date Today Diagnosis I87.2 Venous insufficiency (chronic) (peripheral) 08/09/2018 No Yes L97.822 Non-pressure chronic ulcer of other part of left lower leg with 08/09/2018 No Yes fat layer exposed D50.9 Iron deficiency anemia, unspecified 08/09/2018 No Yes J45.998 Other asthma 08/09/2018 No Yes Inactive Problems Resolved Problems Electronic Signature(s) Signed: 12/16/2018 5:10:06 PM By: Lenda Kelp PA-C Entered By: Lenda Kelp on 12/16/2018 14:46:49 Johnathan Scott ([id number]) -------------------------------------------------------------------------------- Progress Note Details Patient Name: Johnathan Scott Date of Service: 12/16/2018 3:15 PM Medical Record Number: [id number] Patient Account Number: [id number] Date of Birth/Sex: [birth year] (53 y.o. M) Treating RN: Rodell Perna Primary Care Provider: Birdie Sons ERIC Other Clinician: Referring Provider: Birdie Sons, ERIC Treating Provider/Extender: Linwood Dibbles, HOYT Weeks in Treatment: 18 Subjective Chief Complaint Information obtained from Patient Left Anterior LE Ulcer History of Present Illness (HPI) 08/09/18 on evaluation today patient presents for initial evaluation and clinic as referral concerning an ulcer that he has on the left anterior lower Trinity which has been present for five months. He states that he actually noticed this after having an "insect bite" while driving down the road. He states that he did not see what kind of insect it was but he felt it wrong up his leg and then have the bite. Since that time is been having issues with the fact that this area does  not want to heal. He does have a history of asthma as well as anemia is most recent CBC revealed a hemoglobin of 11.1  with a hematocrit of 34.6. He does have a history of panic attacks as well intermittently. No fevers, chills, nausea, or vomiting noted at this time. Specifically the patient does not appear to have any infection in regard to the left lower extremity. He's not on the current antibiotics. 08/16/18 upon evaluation today patient actually appears to be doing significantly better in regard to his wound. He has been tolerating the dressing changes that he was performing on his own over the last week. Subsequently we did not wrap his leg at that time although we discussed it. We weren't sure that he was gonna be able to get his pants on top of the dressing. Nonetheless the patient fortunately came today with shorts on he states that he is willing to be wrapped if it will help the wound to heal faster. He is pleased with the fact that the wound already appears to be doing better it's also not hurting as badly. The last debridement was a little bit more uncomfortable for him unfortunately. Nonetheless he states he's willing to allow me to debride it again today if I do so carefully. 08/24/18 upon evaluation today patient actually appears to be doing rather well in regard to his lower Trinity ulcer on the left. He has been tolerating the dressing changes with Iodoflex including the wrap without complication and the wound [place] into making signs of good progress. Overall I'm very pleased with how things appear at this time. No fevers, chills, nausea, or vomiting noted at this time. 09/03/18 on evaluation today patient actually appears to be doing rather well in regard to his wound. In fact the overall measurement belies the fact that the wound is actually healed quite a bit more than what the measurement would suggest. In fact it's almost half the size that it was during the last evaluation unfortunately a lot of the area where new skin has grown over is in between two areas that had to be clustered together one  of the distal portion of the wound has not completely healed up. Nonetheless there is actually quite a bit more healing than what seems to be represented by the wound measurement today. Overall I'm very pleased with the overall progress and how he seems to be doing with the dressing changes. The wrap seems to be of great benefit for him as well. 09/09/18 upon evaluation today patient actually appears to be doing excellent in regard to his left lotion the ulcer. He has been tolerating the dressing changes without complication. There does not appear to be any evidence of infection overall I think the [facility] Dressing has done excellent for him. I'm very pleased. 09/23/18 evaluation today patient actually appears to be doing poorly in regard to his lower extremity ulcer. There's a lot of maceration and skin breakdown around the actual wound itself which is making it difficult to even tell exactly what is going on. This is something he states started about four days ago. Nonetheless I'm concerned about the possibility of infection just being that he was not having near this amount of drainage previous. He also states in the past 24 hours the pain has been a little bit more significant. No fevers, chills, nausea, or vomiting noted at this time. 09/30/18 on evaluation today patient actually appears to be doing poorly in regard to his lower extremity  ulcer. He's been tolerating the dressing changes although there is a lot of tape irritation surrounding the wound area. Subsequently I do think [facility], Yakir ([id number]) that he would benefit from [place] initiating treatment with all the compression wrap as we did before to avoid any additional injury to the area. No fevers, chills, nausea, or vomiting noted at this time. The patient did have a return of his wound culture which showed Pseudomonas which will not be covered by the Bactrim that I previously prescribed for him. With that being said  the patient states that he did not know I was sitting in a prescription for him and therefore never picked this up he also states the pharmacy did not call him. Nonetheless this was something that was sent we will call the pharmacy and having counseling as I'm gonna have to send them something different. 10/08/18 on evaluation today patient's ulcer still appears to be much larger than what it was previous to the deterioration. With that being said the erythema surrounding the wound bed is not nearly as significant I do believe the antibiotic has been of help for him. He did finally get the Levaquin which is good news. No fevers, chills, nausea, or vomiting noted at this time. 10/12/18 on evaluation today patient actually appears to be doing much better in regard to his lower extremity ulcer. Overall I do believe the dressing change has done well for him at this time. I'm pleased with the overall progress even since just in the last week. 10/19/18 Seen today for follow up and management of left lower leg wound. Wound looks to be improving today and measuring smaller. Decreased surrounding erythema. No s/s of infection. Denies any increased pain, fever, or chills. 10/26/18 on evaluation today patient's wound actually appears to be doing very well. Unfortunately he has a new wound on the medial aspect of his lower extremity that is due to the wrap having slid down. He did not contact us as he did not know whether or not we be able to work him in. Nonetheless I told him in the future if this happens the definitely call us and let us know. 11/01/18 on evaluation today patient's lower Trinity ulcer appears to show signs of improvement compared to last week's evaluation. In fact the new area which blistered and arose last week appears to likely be completely healed at this time. Fortunately there is no sign of infection which is also good news. Overall I'm very pleased with how things seem to  be progressing. 11/08/18 on evaluation today patient appears to be doing better in regard to his lower extremity ulcer. Fortunately there is no evidence of infection at this time. Overall he seems to be making good progress which is good news. 11/15/18 on evaluation today patient appears to be doing very well at this point in regard to left anterior lower Trinity ulcer. This is not completely healed but does seem to be progressing in an appropriate fashion at this time. Fortunately there is no sign of infection currently. No fevers, chills, nausea, or vomiting noted at this time. 11/26/2018 Seen today for follow-up and management of left lower leg anterior ulcer. Wound continues to be progressing well. There is no evidence of infection. He did express wanting to end wound care and just to apply a Band-Aid over the wound. Discussed healing process and the need for ongoing compliance with wound care. He denies any immediate concerns during visit. No recent fevers, chills, or shortness of breath.  12/02/18 on evaluation today patient actually appears to be doing very well in regard to his lower extremity ulcer which is shown signs of good improvement. Fortunately there is no sign of infection at this time. Patient has been tolerating the dressing changes without complication. I'm very pleased with how things have been progressing. No fevers, chills, nausea, or vomiting noted at this time. 12/09/18 on evaluation today patient continues to made excellent progress in regard to his left lower extremity ulcer. He's been tolerating the dressing changes without complication. The wrap does seem to be doing his job and is doing so excellently. The biggest issue is he is having this work to come in to get the wrap change. Fortunately there's no signs of infection at this time which is good news. 12/16/18 on evaluation today patient actually appears to be doing excellent in regard to his lower extremity ulcer. Fact  this appears to be almost completely healed. There's just a very small opening still remaining the upper portion of the wound otherwise everything else shows to be closed and there is significant epithelialization noted in general. I'm overall very pleased with how things seem to be improving. I think that he will be healed by next week. Patient History Information obtained from Patient. Family History NEILAN, DOWNHAM ([id number]) Cancer - Mother,Father,Siblings, Diabetes - Siblings, Heart Disease - Siblings, Hypertension - Siblings, No family history of Hereditary Spherocytosis, Kidney Disease, Lung Disease, Seizures, Stroke, Thyroid Problems, Tuberculosis. Social History Former smoker - 7 years, Marital Status - Single, Alcohol Use - Rarely, Drug Use - No History, Caffeine Use - Never. Medical History Eyes Denies history of Cataracts, Glaucoma, Optic Neuritis Ear/Nose/Mouth/Throat Denies history of Chronic sinus problems/congestion, Middle ear problems Hematologic/Lymphatic Denies history of Anemia, Hemophilia, Human Immunodeficiency Virus, Lymphedema, Sickle Cell Disease Respiratory Patient has history of Asthma - history as a child Denies history of Aspiration, Chronic Obstructive Pulmonary Disease (COPD), Pneumothorax, Sleep Apnea, Tuberculosis Cardiovascular Denies history of Angina, Arrhythmia, Congestive Heart Failure, Coronary Artery Disease, Deep Vein Thrombosis, Hypertension, Hypotension, Myocardial Infarction, Peripheral Arterial Disease, Peripheral Venous Disease, Phlebitis, Vasculitis Gastrointestinal Denies history of Cirrhosis , Colitis, Crohn s, Hepatitis A, Hepatitis B, Hepatitis C Endocrine Denies history of Type I Diabetes, Type II Diabetes Genitourinary Denies history of End Stage Renal Disease Immunological Denies history of Lupus Erythematosus, Raynaud s, Scleroderma Integumentary (Skin) Denies history of History of Burn, History of pressure  wounds Musculoskeletal Patient has history of Osteoarthritis - both hips Denies history of Gout, Rheumatoid Arthritis, Osteomyelitis Neurologic Patient has history of Seizure Disorder - as a child Denies history of Dementia, Neuropathy, Quadriplegia, Paraplegia Oncologic Denies history of Received Chemotherapy, Received Radiation Psychiatric Denies history of Anorexia/bulimia, Confinement Anxiety Review of Systems (ROS) Constitutional Symptoms (General Health) Denies complaints or symptoms of Fever, Chills. Respiratory The patient has no complaints or symptoms. Cardiovascular The patient has no complaints or symptoms. Psychiatric The patient has no complaints or symptoms. TAYSEN, SHEESLEY ([id number]) Objective Constitutional Well-nourished and well-hydrated in no acute distress. Vitals Time Taken: 3:31 PM, Height: 61 in, Weight: 232 lbs, BMI: 43.8, Temperature: 98.1 F, Pulse: 59 bpm, Respiratory Rate: 16 breaths/min, Blood Pressure: 133/65 mmHg. Respiratory normal breathing without difficulty. clear to auscultation bilaterally. Cardiovascular regular rate and rhythm with normal S1, S2. trace pitting edema of the bilateral lower extremities. Psychiatric this patient is able to make decisions and demonstrates good insight into disease process. Alert and Oriented x 3. pleasant and cooperative. General Notes: Patient's wound bed currently shows signs of a little  bit of dry skin around the edge of the wound which was mechanically debrided away with saline and gauze there is a very small opening central although this is very tiny I think it's gonna be completely healed by next week. Integumentary (Hair, Skin) Wound #1 status is Open. Original cause of wound was Gradually Appeared. The wound is located on the Left,Anterior Lower Leg. The wound measures 0.3cm length x 0.3cm width x 0.1cm depth; 0.071cm^2 area and 0.007cm^3 volume. There is Fat Layer (Subcutaneous Tissue) Exposed  exposed. There is no tunneling or undermining noted. There is a small amount of serous drainage noted. The wound margin is flat and intact. There is large (67-100%) red granulation within the wound bed. There is no necrotic tissue within the wound bed. The periwound skin appearance exhibited: Dry/Scaly, Hemosiderin Staining. The periwound skin appearance did not exhibit: Callus, Crepitus, Excoriation, Induration, Rash, Scarring, Maceration, Atrophie Blanche, Cyanosis, Ecchymosis, Mottled, Pallor, Rubor, Erythema. Periwound temperature was noted as No Abnormality. Assessment Active Problems ICD-10 Venous insufficiency (chronic) (peripheral) Non-pressure chronic ulcer of other part of left lower leg with fat layer exposed Iron deficiency anemia, unspecified Other asthma Plan Wound Cleansing: Wound #1 Left,Anterior Lower Leg: Clean wound with Normal Saline. Johnathan SextonSHAMBLEY, Bryden ([id number]) May Shower, gently pat wound dry prior to applying new dressing. Anesthetic (add to Medication List): Wound #1 Left,Anterior Lower Leg: Topical Lidocaine 4% cream applied to wound bed prior to debridement (In Clinic Only). Primary Wound Dressing: Wound #1 Left,Anterior Lower Leg: Mupirocin Ointment Silver Alginate Secondary Dressing: Wound #1 Left,Anterior Lower Leg: ABD pad Dressing Change Frequency: Wound #1 Left,Anterior Lower Leg: Change dressing every week Follow-up Appointments: Wound #1 Left,Anterior Lower Leg: Return Appointment in 1 week. Edema Control: Wound #1 Left,Anterior Lower Leg: 3 Layer Compression System - Left Lower Extremity Elevate legs to the level of the heart and pump ankles as often as possible I'm in a recommend that we continue with the above wound care measures for the next week. Patient is in agreement with plan. We will see were things stand at follow-up. Please see above for specific wound care orders. We will see patient for re-evaluation in 1 week(s) here in the  clinic. If anything worsens or changes patient will contact our office for additional recommendations. I explained to the patient that I do believe that this will likely be completely healed by that time although I do think the one additional week of treatment will be of benefit for him as far as helping area to toughen up and getting this last small area to completely close. He did consent to one more week of the wrapping. Electronic Signature(s) Signed: 12/16/2018 5:10:06 PM By: Lenda KelpStone III, Hoyt PA-C Entered By: Lenda KelpStone III, Hoyt on 12/16/2018 16:02:54 Johnathan SextonSHAMBLEY, Dutch ([id number]) -------------------------------------------------------------------------------- ROS/PFSH Details Patient Name: Johnathan SextonSHAMBLEY, Zygmunt Date of Service: 12/16/2018 3:15 PM Medical Record Number: [id number] Patient Account Number: [id number] Date of Birth/Sex: Oct 22, 1965 (53 y.o. M) Treating RN: Rodell PernaScott, Dajea Primary Care Provider: Birdie SonsSONNENBERG, ERIC Other Clinician: Referring Provider: Birdie SonsSONNENBERG, ERIC Treating Provider/Extender: Linwood DibblesSTONE III, HOYT Weeks in Treatment: 18 Information Obtained From Patient Wound History Do you currently have one or more open woundso Yes How many open wounds do you currently haveo 1 Approximately how long have you had your woundso 5 months How have you been treating your wound(s) until nowo open to air Has your wound(s) ever healed and then re-openedo No Have you had any lab work done in the past montho No Have you tested positive for an antibiotic  resistant organism (MRSA, VRE)o No Have you tested positive for osteomyelitis (bone infection)o No Have you had any tests for circulation on your legso No Have you had other problems associated with your woundso Swelling Constitutional Symptoms (General Health) Complaints and Symptoms: Negative for: Fever; Chills Eyes Medical History: Negative for: Cataracts; Glaucoma; Optic Neuritis Ear/Nose/Mouth/Throat Medical History: Negative for:  Chronic sinus problems/congestion; Middle ear problems Hematologic/Lymphatic Medical History: Negative for: Anemia; Hemophilia; Human Immunodeficiency Virus; Lymphedema; Sickle Cell Disease Respiratory Complaints and Symptoms: No Complaints or Symptoms Medical History: Positive for: Asthma - history as a child Negative for: Aspiration; Chronic Obstructive Pulmonary Disease (COPD); Pneumothorax; Sleep Apnea; Tuberculosis Cardiovascular Complaints and Symptoms: No Complaints or Symptoms Medical HistoryDARCEL, FRANE ([id number]) Negative for: Angina; Arrhythmia; Congestive Heart Failure; Coronary Artery Disease; Deep Vein Thrombosis; Hypertension; Hypotension; Myocardial Infarction; Peripheral Arterial Disease; Peripheral Venous Disease; Phlebitis; Vasculitis Gastrointestinal Medical History: Negative for: Cirrhosis ; Colitis; Crohnos; Hepatitis A; Hepatitis B; Hepatitis C Endocrine Medical History: Negative for: Type I Diabetes; Type II Diabetes Genitourinary Medical History: Negative for: End Stage Renal Disease Immunological Medical History: Negative for: Lupus Erythematosus; Raynaudos; Scleroderma Integumentary (Skin) Medical History: Negative for: History of Burn; History of pressure wounds Musculoskeletal Medical History: Positive for: Osteoarthritis - both hips Negative for: Gout; Rheumatoid Arthritis; Osteomyelitis Neurologic Medical History: Positive for: Seizure Disorder - as a child Negative for: Dementia; Neuropathy; Quadriplegia; Paraplegia Oncologic Medical History: Negative for: Received Chemotherapy; Received Radiation Psychiatric Complaints and Symptoms: No Complaints or Symptoms Medical History: Negative for: Anorexia/bulimia; Confinement Anxiety Immunizations Pneumococcal Vaccine: Received Pneumococcal Vaccination: No Implantable Devices [place], Maisie Fus ([id number]) No devices added Family and Social History Cancer: Yes -  Mother,Father,Siblings; Diabetes: Yes - Siblings; Heart Disease: Yes - Siblings; Hereditary Spherocytosis: No; Hypertension: Yes - Siblings; Kidney Disease: No; Lung Disease: No; Seizures: No; Stroke: No; Thyroid Problems: No; Tuberculosis: No; Former smoker - 7 years; Marital Status - Single; Alcohol Use: Rarely; Drug Use: No History; Caffeine Use: Never; Financial Concerns: No; Food, Clothing or Shelter Needs: No; Support System Lacking: No; Transportation Concerns: No; Advanced Directives: No; Patient does not want information on Advanced Directives Physician Affirmation I have reviewed and agree with the above information. Electronic Signature(s) Signed: 12/16/2018 5:10:06 PM By: Lenda Kelp PA-C Signed: 12/17/2018 12:47:49 PM By: Rodell Perna Entered By: Lenda Kelp on 12/16/2018 16:01:55 Johnathan Scott ([id number]) -------------------------------------------------------------------------------- SuperBill Details Patient Name: Johnathan Scott Date of Service: 12/16/2018 Medical Record Number: [id number] Patient Account Number: [id number] Date of Birth/Sex: 12-06-1965 (53 y.o. M) Treating RN: Rodell Perna Primary Care Provider: Birdie Sons, ERIC Other Clinician: Referring Provider: Birdie Sons, ERIC Treating Provider/Extender: Linwood Dibbles, HOYT Weeks in Treatment: 18 Diagnosis Coding ICD-10 Codes Code Description I87.2 Venous insufficiency (chronic) (peripheral) L97.822 Non-pressure chronic ulcer of other part of left lower leg with fat layer exposed D50.9 Iron deficiency anemia, unspecified J45.998 Other asthma Facility Procedures CPT4 Code: [id number] Description: (Facility Use Only) [phone] - APPLY MULTLAY COMPRS LWR LT LEG Modifier: Quantity: 1 Physician Procedures CPT4 Code Description: [id number] - WC PHYS LEVEL 4 - EST PT ICD-10 Diagnosis Description I87.2 Venous insufficiency (chronic) (peripheral) L97.822 Non-pressure chronic ulcer of other part of left lower leg  wit D50.9 Iron deficiency anemia,  unspecified J45.998 Other asthma Modifier: h fat layer expos Quantity: 1 ed Electronic Signature(s) Signed: 12/16/2018 4:30:33 PM By: Rodell Perna Signed: 12/16/2018 5:10:06 PM By: Lenda Kelp PA-C Entered By: Rodell Perna on 12/16/2018 16:30:32

## 2018-12-19 ENCOUNTER — Other Ambulatory Visit: Payer: Self-pay | Admitting: Family Medicine

## 2018-12-19 DIAGNOSIS — N179 Acute kidney failure, unspecified: Secondary | ICD-10-CM

## 2018-12-23 ENCOUNTER — Encounter: Payer: 59 | Attending: Physician Assistant | Admitting: Physician Assistant

## 2018-12-23 DIAGNOSIS — Z87891 Personal history of nicotine dependence: Secondary | ICD-10-CM | POA: Insufficient documentation

## 2018-12-23 DIAGNOSIS — I872 Venous insufficiency (chronic) (peripheral): Secondary | ICD-10-CM | POA: Diagnosis not present

## 2018-12-23 DIAGNOSIS — Z872 Personal history of diseases of the skin and subcutaneous tissue: Secondary | ICD-10-CM | POA: Diagnosis not present

## 2018-12-23 DIAGNOSIS — Z09 Encounter for follow-up examination after completed treatment for conditions other than malignant neoplasm: Secondary | ICD-10-CM | POA: Insufficient documentation

## 2018-12-23 DIAGNOSIS — L97829 Non-pressure chronic ulcer of other part of left lower leg with unspecified severity: Secondary | ICD-10-CM | POA: Diagnosis not present

## 2018-12-26 NOTE — Progress Notes (Signed)
Johnathan Scott, Kase ([id number]) Visit Report for 12/23/2018 Arrival Information Details Patient Name: Johnathan Scott, Johnathan Scott Date of Service: 12/23/2018 2:30 PM Medical Record Number: [id number] Patient Account Number: [id number] Date of Birth/Sex: [birth year] (53 y.o. M) Treating RN: Arnette NorrisBiell, Kristina Primary Care Dain Laseter: Birdie SonsSONNENBERG, ERIC Other Clinician: Referring Amanada Philbrick: Birdie SonsSONNENBERG, ERIC Treating Haruye Lainez/Extender: Linwood DibblesSTONE III, HOYT Weeks in Treatment: 19 Visit Information History Since Last Visit Added or deleted any medications: No Patient Arrived: Cane Any new allergies or adverse reactions: No Arrival Time: 14:37 Had a fall or experienced change in No Accompanied By: self activities of daily living that may affect Transfer Assistance: None risk of falls: Patient Identification Verified: Yes Signs or symptoms of abuse/neglect since last visito No Secondary Verification Process Yes Hospitalized since last visit: No Completed: Has Dressing in Place as Prescribed: Yes Patient Has Alerts: Yes Has Compression in Place as Prescribed: Yes Patient Alerts: PT REFUSED FACE Pain Present Now: No PHOTO Electronic Signature(s) Signed: 12/24/2018 3:21:09 PM By: Arnette NorrisBiell, Kristina Entered By: Arnette NorrisBiell, Kristina on 12/23/2018 14:38:20 Johnathan Scott, Johnathan Scott ([id number]) -------------------------------------------------------------------------------- Clinic Level of Care Assessment Details Patient Name: Johnathan Scott, Johnathan Scott Date of Service: 12/23/2018 2:30 PM Medical Record Number: [id number] Patient Account Number: [id number] Date of Birth/Sex: [birth year] (53 y.o. M) Treating RN: Rodell PernaScott, Dajea Primary Care Lory Galan: Birdie SonsSONNENBERG, ERIC Other Clinician: Referring Ronit Marczak: Birdie SonsSONNENBERG, ERIC Treating Iona Stay/Extender: Linwood DibblesSTONE III, HOYT Weeks in Treatment: 19 Clinic Level of Care Assessment Items TOOL 4 Quantity Score X - Use when only an EandM is performed on FOLLOW-UP visit 1 0 ASSESSMENTS - Nursing Assessment /  Reassessment X - Reassessment of Co-morbidities (includes updates in patient status) 1 10 X- 1 5 Reassessment of Adherence to Treatment Plan ASSESSMENTS - Wound and Skin Assessment / Reassessment X - Simple Wound Assessment / Reassessment - one wound 1 5 []  - 0 Complex Wound Assessment / Reassessment - multiple wounds []  - 0 Dermatologic / Skin Assessment (not related to wound area) ASSESSMENTS - Focused Assessment []  - Circumferential Edema Measurements - multi extremities 0 []  - 0 Nutritional Assessment / Counseling / Intervention []  - 0 Lower Extremity Assessment (monofilament, tuning fork, pulses) []  - 0 Peripheral Arterial Disease Assessment (using hand held doppler) ASSESSMENTS - Ostomy and/or Continence Assessment and Care []  - Incontinence Assessment and Management 0 []  - 0 Ostomy Care Assessment and Management (repouching, etc.) PROCESS - Coordination of Care X - Simple Patient / Family Education for ongoing care 1 15 []  - 0 Complex (extensive) Patient / Family Education for ongoing care []  - 0 Staff obtains ChiropractorConsents, Records, Test Results / Process Orders []  - 0 Staff telephones HHA, Nursing Homes / Clarify orders / etc []  - 0 Routine Transfer to another Facility (non-emergent condition) []  - 0 Routine Hospital Admission (non-emergent condition) []  - 0 New Admissions / Manufacturing engineernsurance Authorizations / Ordering NPWT, Apligraf, etc. []  - 0 Emergency Hospital Admission (emergent condition) X- 1 10 Simple Discharge Coordination Johnathan Scott, Johnathan Scott ([id number]) []  - 0 Complex (extensive) Discharge Coordination PROCESS - Special Needs []  - Pediatric / Minor Patient Management 0 []  - 0 Isolation Patient Management []  - 0 Hearing / Language / Visual special needs []  - 0 Assessment of Community assistance (transportation, D/C planning, etc.) []  - 0 Additional assistance / Altered mentation []  - 0 Support Surface(s) Assessment (bed, cushion, seat, etc.) INTERVENTIONS -  Wound Cleansing / Measurement X - Simple Wound Cleansing - one wound 1 5 []  - 0 Complex Wound Cleansing - multiple wounds []  - 0 Wound Imaging (photographs - any number of wounds) []  -  0 Wound Tracing (instead of photographs) X- 1 5 Simple Wound Measurement - one wound []  - 0 Complex Wound Measurement - multiple wounds INTERVENTIONS - Wound Dressings []  - Small Wound Dressing one or multiple wounds 0 []  - 0 Medium Wound Dressing one or multiple wounds []  - 0 Large Wound Dressing one or multiple wounds []  - 0 Application of Medications - topical []  - 0 Application of Medications - injection INTERVENTIONS - Miscellaneous []  - External ear exam 0 []  - 0 Specimen Collection (cultures, biopsies, blood, body fluids, etc.) []  - 0 Specimen(s) / Culture(s) sent or taken to Lab for analysis []  - 0 Patient Transfer (multiple staff / Nurse, adult / Similar devices) []  - 0 Simple Staple / Suture removal (25 or less) []  - 0 Complex Staple / Suture removal (26 or more) []  - 0 Hypo / Hyperglycemic Management (close monitor of Blood Glucose) []  - 0 Ankle / Brachial Index (ABI) - do not check if billed separately X- 1 5 Vital Signs Johnathan Scott, Johnathan Scott ([id number]) Has the patient been seen at the hospital within the last three years: Yes Total Score: 60 Level Of Care: New/Established - Level 2 Electronic Signature(s) Signed: 12/24/2018 3:17:07 PM By: Rodell Perna Entered By: Rodell Perna on 12/23/2018 15:06:02 Johnathan Scott ([id number]) -------------------------------------------------------------------------------- Encounter Discharge Information Details Patient Name: Johnathan Scott Date of Service: 12/23/2018 2:30 PM Medical Record Number: [id number] Patient Account Number: [id number] Date of Birth/Sex: [birth year] (53 y.o. M) Treating RN: Rodell Perna Primary Care Iretha Kirley: Birdie Sons, ERIC Other Clinician: Referring Melaine Mcphee: Birdie Sons, ERIC Treating Bronislaw Switzer/Extender: Linwood Dibbles,  HOYT Weeks in Treatment: 19 Encounter Discharge Information Items Discharge Condition: Stable Ambulatory Status: Ambulatory Discharge Destination: Home Transportation: Private Auto Accompanied By: self Schedule Follow-up Appointment: No Clinical Summary of Care: Electronic Signature(s) Signed: 12/23/2018 3:12:54 PM By: Rodell Perna Entered By: Rodell Perna on 12/23/2018 15:12:54 Johnathan Scott ([id number]) -------------------------------------------------------------------------------- Lower Extremity Assessment Details Patient Name: Johnathan Scott Date of Service: 12/23/2018 2:30 PM Medical Record Number: [id number] Patient Account Number: [id number] Date of Birth/Sex: January 05, 1966 (53 y.o. M) Treating RN: Arnette Norris Primary Care Shari Natt: Birdie Sons, ERIC Other Clinician: Referring Kolyn Rozario: Birdie Sons, ERIC Treating Maelie Chriswell/Extender: Linwood Dibbles, HOYT Weeks in Treatment: 19 Edema Assessment Assessed: [Left: No] [Right: No] [Left: Edema] [Right: :] Calf Left: Right: Point of Measurement: 29 cm From Medial Instep 48 cm cm Ankle Left: Right: Point of Measurement: 12 cm From Medial Instep 27 cm cm Vascular Assessment Pulses: Dorsalis Pedis Palpable: [Left:Yes] Posterior Tibial Palpable: [Left:Yes] Extremity colors, hair growth, and conditions: Extremity Color: [Left:Hyperpigmented] Hair Growth on Extremity: [Left:No] Temperature of Extremity: [Left:Warm] Capillary Refill: [Left:< 3 seconds] Toe Nail Assessment Left: Right: Thick: Yes Discolored: Yes Deformed: Yes Improper Length and Hygiene: Yes Electronic Signature(s) Signed: 12/24/2018 3:21:09 PM By: Arnette Norris Entered By: Arnette Norris on 12/23/2018 14:52:38 Johnathan Scott ([id number]) -------------------------------------------------------------------------------- Multi Wound Chart Details Patient Name: Johnathan Scott Date of Service: 12/23/2018 2:30 PM Medical Record Number: [id number] Patient  Account Number: [id number] Date of Birth/Sex: 1966/08/20 (52 y.o. M) Treating RN: Rodell Perna Primary Care Manfred Laspina: Birdie Sons, ERIC Other Clinician: Referring Pairlee Sawtell: Birdie Sons, ERIC Treating [place] Hedman/Extender: Linwood Dibbles, HOYT Weeks in Treatment: 19 Vital Signs Height(in): 61 Pulse(bpm): 56 Weight(lbs): 232 Blood Pressure(mmHg): 117/57 Body Mass Index(BMI): 44 Temperature(F): 97.5 Respiratory Rate 18 (breaths/min): Photos: [N/A:N/A] Wound Location: Left Lower Leg - Anterior N/A N/A Wounding Event: Gradually Appeared N/A N/A Primary Etiology: Venous Leg Ulcer N/A N/A Comorbid History: Asthma, Osteoarthritis, N/A N/A Seizure Disorder Date Acquired: 03/08/2018 N/A N/A Weeks of Treatment: 19  N/A N/A Wound Status: Open N/A N/A Measurements L x W x D 0.1x0.1x0.1 N/A N/A (cm) Area (cm) : 0.008 N/A N/A Volume (cm) : 0.001 N/A N/A % Reduction in Area: 99.90% N/A N/A % Reduction in Volume: 99.90% N/A N/A Classification: Full Thickness Without N/A N/A Exposed Support Structures Exudate Amount: None Present N/A N/A Wound Margin: Flat and Intact N/A N/A Granulation Amount: Large (67-100%) N/A N/A Granulation Quality: Red N/A N/A Necrotic Amount: None Present (0%) N/A N/A Exposed Structures: Fascia: No N/A N/A Fat Layer (Subcutaneous Tissue) Exposed: No Tendon: No Muscle: No Joint: No Bone: No Epithelialization: Medium (34-66%) N/A N/A Johnathan Scott, Johnathan Scott ([id number]) Periwound Skin Texture: Excoriation: No N/A N/A Induration: No Callus: No Crepitus: No Rash: No Scarring: No Periwound Skin Moisture: Dry/Scaly: Yes N/A N/A Maceration: No Periwound Skin Color: Hemosiderin Staining: Yes N/A N/A Atrophie Blanche: No Cyanosis: No Ecchymosis: No Erythema: No Mottled: No Pallor: No Rubor: No Temperature: No Abnormality N/A N/A Tenderness on Palpation: No N/A N/A Wound Preparation: Ulcer Cleansing: Other: soap N/A N/A and water Topical Anesthetic  Applied: Other: lidocaine 4% Treatment Notes Electronic Signature(s) Signed: 12/24/2018 3:17:07 PM By: Rodell Perna Entered By: Rodell Perna on 12/23/2018 15:03:57 Johnathan Scott ([id number]) -------------------------------------------------------------------------------- Multi-Disciplinary Care Plan Details Patient Name: Johnathan Scott Date of Service: 12/23/2018 2:30 PM Medical Record Number: [id number] Patient Account Number: [id number] Date of Birth/Sex: 1966-05-06 (53 y.o. M) Treating RN: Rodell Perna Primary Care [place] Stell: Birdie Sons ERIC Other Clinician: Referring Ryle Buscemi: Birdie Sons, ERIC Treating Kielee Care/Extender: Linwood Dibbles, HOYT Weeks in Treatment: 19 Active Inactive Electronic Signature(s) Signed: 12/24/2018 3:17:07 PM By: Rodell Perna Entered By: Rodell Perna on 12/23/2018 15:03:45 Johnathan Scott ([id number]) -------------------------------------------------------------------------------- Pain Assessment Details Patient Name: Johnathan Scott Date of Service: 12/23/2018 2:30 PM Medical Record Number: [id number] Patient Account Number: [id number] Date of Birth/Sex: September 22, 1966 (53 y.o. M) Treating RN: Arnette Norris Primary Care Trayonna Bachmeier: Birdie Sons, ERIC Other Clinician: Referring Levern Kalka: Birdie Sons, ERIC Treating Perkins Molina/Extender: Linwood Dibbles, HOYT Weeks in Treatment: 19 Active Problems Location of Pain Severity and Description of Pain Patient Has Paino No Site Locations Pain Management and Medication Current Pain Management: Electronic Signature(s) Signed: 12/24/2018 3:21:09 PM By: Arnette Norris Entered By: Arnette Norris on 12/23/2018 14:38:26 Johnathan Scott ([id number]) -------------------------------------------------------------------------------- Patient/Caregiver Education Details Patient Name: Johnathan Scott Date of Service: 12/23/2018 2:30 PM Medical Record Number: [id number] Patient Account Number: [id number] Date of Birth/Gender: 1966/08/11 (53  y.o. M) Treating RN: Rodell Perna Primary Care Physician: Birdie Sons, ERIC Other Clinician: Referring Physician: Birdie Sons, ERIC Treating Physician/Extender: Skeet Simmer in Treatment: 18 Education Assessment Education Provided To: Patient Education Topics Provided Wound/Skin Impairment: Handouts: Caring for Your Ulcer Methods: Demonstration, Explain/Verbal Responses: State content correctly Electronic Signature(s) Signed: 12/24/2018 3:17:07 PM By: Rodell Perna Entered By: Rodell Perna on 12/23/2018 15:06:19 Johnathan Scott ([id number]) -------------------------------------------------------------------------------- Wound Assessment Details Patient Name: Johnathan Scott Date of Service: 12/23/2018 2:30 PM Medical Record Number: [id number] Patient Account Number: [id number] Date of Birth/Sex: [birth year] (53 y.o. M) Treating RN: Rodell Perna Primary Care Vaughan Garfinkle: Birdie Sons, ERIC Other Clinician: Referring Jaedin Regina: Birdie Sons, ERIC Treating Jayne Peckenpaugh/Extender: STONE III, HOYT Weeks in Treatment: 19 Wound Status Wound Number: 1 Primary Etiology: Venous Leg Ulcer Wound Location: Left, Anterior Lower Leg Wound Status: Healed - Epithelialized Wounding Event: Gradually Appeared Comorbid History: Asthma, Osteoarthritis, Seizure Disorder Date Acquired: 03/08/2018 Weeks Of Treatment: 19 Clustered Wound: No Photos Wound Measurements Length: (cm) 0 % Reducti Width: (cm) 0 % Reducti Depth: (cm) 0 Epithelia Area: (cm) 0 Tunnelin Volume: (cm) 0 Undermin on in Area: 100% on in  Volume: 100% lization: Medium (34-66%) g: No ing: No Wound Description Full Thickness Without Exposed Support Foul Odor Classification: Structures Slough/Fi Wound Margin: Flat and Intact Exudate None Present Amount: After Cleansing: No brino No Wound Bed Granulation Amount: Large (67-100%) Exposed Structure Granulation Quality: Red Fascia Exposed: No Necrotic Amount: None Present  (0%) Fat Layer (Subcutaneous Tissue) Exposed: No Tendon Exposed: No Muscle Exposed: No Joint Exposed: No Bone Exposed: No Periwound Skin Texture Texture Color Johnathan Scott, Johnathan Scott ([id number]) No Abnormalities Noted: No No Abnormalities Noted: No Callus: No Atrophie Blanche: No Crepitus: No Cyanosis: No Excoriation: No Ecchymosis: No Induration: No Erythema: No Rash: No Hemosiderin Staining: Yes Scarring: No Mottled: No Pallor: No Moisture Rubor: No No Abnormalities Noted: No Dry / Scaly: Yes Temperature / Pain Maceration: No Temperature: No Abnormality Wound Preparation Ulcer Cleansing: Other: soap and water, Topical Anesthetic Applied: Other: lidocaine 4%, Electronic Signature(s) Signed: 12/24/2018 3:17:07 PM By: Rodell Perna Entered By: Rodell Perna on 12/23/2018 15:06:44 Johnathan Scott ([id number]) -------------------------------------------------------------------------------- Vitals Details Patient Name: Johnathan Scott Date of Service: 12/23/2018 2:30 PM Medical Record Number: [id number] Patient Account Number: [id number] Date of Birth/Sex: February 04, 1966 (53 y.o. M) Treating RN: Arnette Norris Primary Care Miette Molenda: Birdie Sons, ERIC Other Clinician: Referring Devika Dragovich: Birdie Sons, ERIC Treating Tamzin Bertling/Extender: Linwood Dibbles, HOYT Weeks in Treatment: 19 Vital Signs Time Taken: 14:40 Temperature (F): 97.5 Height (in): 61 Pulse (bpm): 56 Weight (lbs): 232 Respiratory Rate (breaths/min): 18 Body Mass Index (BMI): 43.8 Blood Pressure (mmHg): 117/57 Reference Range: 80 - 120 mg / dl Electronic Signature(s) Signed: 12/24/2018 3:21:09 PM By: Arnette Norris Entered By: Arnette Norris on 12/23/2018 14:42:07

## 2018-12-27 ENCOUNTER — Other Ambulatory Visit (INDEPENDENT_AMBULATORY_CARE_PROVIDER_SITE_OTHER): Payer: 59

## 2018-12-27 ENCOUNTER — Telehealth: Payer: Self-pay | Admitting: Family Medicine

## 2018-12-27 DIAGNOSIS — N179 Acute kidney failure, unspecified: Secondary | ICD-10-CM | POA: Diagnosis not present

## 2018-12-27 DIAGNOSIS — D649 Anemia, unspecified: Secondary | ICD-10-CM | POA: Diagnosis not present

## 2018-12-27 NOTE — Progress Notes (Signed)
HERNDON, GRILL ([id number]) Visit Report for 12/23/2018 Chief Complaint Document Details Patient Name: Johnathan Scott, Johnathan Scott Date of Service: 12/23/2018 2:30 PM Medical Record Number: [id number] Patient Account Number: [id number] Date of Birth/Sex: November 20, 1965 (53 y.o. M) Treating RN: Rodell Perna Primary Care Provider: Birdie Sons ERIC Other Clinician: Referring Provider: Birdie Sons, ERIC Treating Provider/Extender: Linwood Dibbles, Quantavis Obryant Weeks in Treatment: 19 Information Obtained from: Patient Chief Complaint Left Anterior LE Ulcer Electronic Signature(s) Signed: 12/26/2018 11:30:14 PM By: Lenda Kelp PA-C Entered By: Lenda Kelp on 12/23/2018 15:02:39 Johnathan Scott ([id number]) -------------------------------------------------------------------------------- HPI Details Patient Name: Johnathan Scott Date of Service: 12/23/2018 2:30 PM Medical Record Number: [id number] Patient Account Number: [id number] Date of Birth/Sex: 1965-11-25 (53 y.o. M) Treating RN: Rodell Perna Primary Care Provider: Birdie Sons ERIC Other Clinician: Referring Provider: Birdie Sons, ERIC Treating Provider/Extender: Linwood Dibbles, Kalik Hoare Weeks in Treatment: 19 History of Present Illness HPI Description: 08/09/18 on evaluation today patient presents for initial evaluation and clinic as referral concerning an ulcer that he has on the left anterior lower Trinity which has been present for five months. He states that he actually noticed this after having an "insect bite" while driving down the road. He states that he did not see what kind of insect it was but he felt it wrong up his leg and then have the bite. Since that time is been having issues with the fact that this area does not want to heal. He does have a history of asthma as well as anemia is most recent CBC revealed a hemoglobin of 11.1 with a hematocrit of 34.6. He does have a history of panic attacks as well intermittently. No fevers, chills, nausea, or vomiting  noted at this time. Specifically the patient does not appear to have any infection in regard to the left lower extremity. He's not on the current antibiotics. 08/16/18 upon evaluation today patient actually appears to be doing significantly better in regard to his wound. He has been tolerating the dressing changes that he was performing on his own over the last week. Subsequently we did not wrap his leg at that time although we discussed it. We weren't sure that he was gonna be able to get his pants on top of the dressing. Nonetheless the patient fortunately came today with shorts on he states that he is willing to be wrapped if it will help the wound to heal faster. He is pleased with the fact that the wound already appears to be doing better it's also not hurting as badly. The last debridement was a little bit more uncomfortable for him unfortunately. Nonetheless he states he's willing to allow me to debride it again today if I do so carefully. 08/24/18 upon evaluation today patient actually appears to be doing rather well in regard to his lower Trinity ulcer on the left. He has been tolerating the dressing changes with Iodoflex including the wrap without complication and the wound [place] into making signs of good progress. Overall I'm very pleased with how things appear at this time. No fevers, chills, nausea, or vomiting noted at this time. 09/03/18 on evaluation today patient actually appears to be doing rather well in regard to his wound. In fact the overall measurement belies the fact that the wound is actually healed quite a bit more than what the measurement would suggest. In fact it's almost half the size that it was during the last evaluation unfortunately a lot of the area where new skin has grown over is in between two areas that had to  be clustered together one of the distal portion of the wound has not completely healed up. Nonetheless there is actually quite a bit more healing than what  seems to be represented by the wound measurement today. Overall I'm very pleased with the overall progress and how he seems to be doing with the dressing changes. The wrap seems to be of great benefit for him as well. 09/09/18 upon evaluation today patient actually appears to be doing excellent in regard to his left lotion the ulcer. He has been tolerating the dressing changes without complication. There does not appear to be any evidence of infection overall I think the [facility] Dressing has done excellent for him. I'm very pleased. 09/23/18 evaluation today patient actually appears to be doing poorly in regard to his lower extremity ulcer. There's a lot of maceration and skin breakdown around the actual wound itself which is making it difficult to even tell exactly what is going on. This is something he states started about four days ago. Nonetheless I'm concerned about the possibility of infection just being that he was not having near this amount of drainage previous. He also states in the past 24 hours the pain has been a little bit more significant. No fevers, chills, nausea, or vomiting noted at this time. 09/30/18 on evaluation today patient actually appears to be doing poorly in regard to his lower extremity ulcer. He's been tolerating the dressing changes although there is a lot of tape irritation surrounding the wound area. Subsequently I do think that he would benefit from [place] initiating treatment with all the compression wrap as we did before to avoid any additional injury to the area. No fevers, chills, nausea, or vomiting noted at this time. The patient did have a return of his wound culture which showed Pseudomonas which will not be covered by the Bactrim that I previously prescribed for him. With that being said the patient states that he did not know I was sitting in a prescription for him and therefore never picked this up he also states the pharmacy did not call him.  Nonetheless this was something that was sent we will call the pharmacy and having counseling as [name], [name] ([id number]) I'm gonna have to send them something different. 10/08/18 on evaluation today patient's ulcer still appears to be much larger than what it was previous to the deterioration. With that being said the erythema surrounding the wound bed is not nearly as significant I do believe the antibiotic has been of help for him. He did finally get the Levaquin which is good news. No fevers, chills, nausea, or vomiting noted at this time. 10/12/18 on evaluation today patient actually appears to be doing much better in regard to his lower extremity ulcer. Overall I do believe the dressing change has done well for him at this time. I'm pleased with the overall progress even since just in the last week. 10/19/18 Seen today for follow up and management of left lower leg wound. Wound looks to be improving today and measuring smaller. Decreased surrounding erythema. No s/s of infection. Denies any increased pain, fever, or chills. 10/26/18 on evaluation today patient's wound actually appears to be doing very well. Unfortunately he has a new wound on the medial aspect of his lower extremity that is due to the wrap having slid down. He did not contact us as he did not know whether or not we be able to work him in. Nonetheless I told him in the future if  this happens the definitely call us and let us know. 11/01/18 on evaluation today patient's lower Trinity ulcer appears to show signs of improvement compared to last week's evaluation. In fact the new area which blistered and arose last week appears to likely be completely healed at this time. Fortunately there is no sign of infection which is also good news. Overall I'm very pleased with how things seem to be progressing. 11/08/18 on evaluation today patient appears to be doing better in regard to his lower extremity ulcer. Fortunately there is  no evidence of infection at this time. Overall he seems to be making good progress which is good news. 11/15/18 on evaluation today patient appears to be doing very well at this point in regard to left anterior lower Trinity ulcer. This is not completely healed but does seem to be progressing in an appropriate fashion at this time. Fortunately there is no sign of infection currently. No fevers, chills, nausea, or vomiting noted at this time. 11/26/2018 Seen today for follow-up and management of left lower leg anterior ulcer. Wound continues to be progressing well. There is no evidence of infection. He did express wanting to end wound care and just to apply a Band-Aid over the wound. Discussed healing process and the need for ongoing compliance with wound care. He denies any immediate concerns during visit. No recent fevers, chills, or shortness of breath. 12/02/18 on evaluation today patient actually appears to be doing very well in regard to his lower extremity ulcer which is shown signs of good improvement. Fortunately there is no sign of infection at this time. Patient has been tolerating the dressing changes without complication. I'm very pleased with how things have been progressing. No fevers, chills, nausea, or vomiting noted at this time. 12/09/18 on evaluation today patient continues to made excellent progress in regard to his left lower extremity ulcer. He's been tolerating the dressing changes without complication. The wrap does seem to be doing his job and is doing so excellently. The biggest issue is he is having this work to come in to get the wrap change. Fortunately there's no signs of infection at this time which is good news. 12/16/18 on evaluation today patient actually appears to be doing excellent in regard to his lower extremity ulcer. Fact this appears to be almost completely healed. There's just a very small opening still remaining the upper portion of the wound otherwise  everything else shows to be closed and there is significant epithelialization noted in general. I'm overall very pleased with how things seem to be improving. I think that he will be healed by next week. 12/23/18 on evaluation today patient's wound actually appears to be completely healed at this point which is excellent news. He has been tolerating the dressing changes without complication and fortunately has done extremely well as far as I'm concerned. Overall I feel like that he will continue to do well with a compression wrapping which he can use his Velcro compression wrap for the next couple of weeks and then switch over to a compression stocking which I would recommend indefinitely. Electronic Signature(s) Signed: 12/26/2018 11:30:14 PM By: Buren Kos Le[place], [place] ([id number]) Entered By: Lenda Kelp on 12/25/2018 06:52:05 Johnathan Scott, Johnathan Scott ([id number]) -------------------------------------------------------------------------------- Physical Exam Details Patient Name: Johnathan Scott Date of Service: 12/23/2018 2:30 PM Medical Record Number: [id number] Patient Account Number: [id number] Date of Birth/Sex: [birth year] (53 y.o. M) Treating RN: Rodell Perna Primary Care Provider: Birdie Sons ERIC Other Clinician: Referring Provider: Birdie Sons, ERIC  Treating Provider/Extender: STONE III, Myson Levi Weeks in Treatment: 19 Constitutional Well-nourished and well-hydrated in no acute distress. Respiratory normal breathing without difficulty. Psychiatric this patient is able to make decisions and demonstrates good insight into disease process. Alert and Oriented x 3. pleasant and cooperative. Notes Patient's wound currently shows signs of complete epithelialization everything appears to be doing excellent at this point. There is no sign of infection currently. Electronic Signature(s) Signed: 12/26/2018 11:30:14 PM By: Lenda Kelp PA-C Entered By: Lenda Kelp on 12/25/2018  06:52:38 Johnathan Scott ([id number]) -------------------------------------------------------------------------------- Physician Orders Details Patient Name: Johnathan Scott Date of Service: 12/23/2018 2:30 PM Medical Record Number: [id number] Patient Account Number: [id number] Date of Birth/Sex: 06-16-1966 (53 y.o. M) Treating RN: Rodell Perna Primary Care Provider: Birdie Sons, ERIC Other Clinician: Referring Provider: Birdie Sons, ERIC Treating Provider/Extender: Linwood Dibbles, [place] Lapaglia Weeks in Treatment: 19 Verbal / Phone Orders: No Diagnosis Coding ICD-10 Coding Code Description I87.2 Venous insufficiency (chronic) (peripheral) L97.822 Non-pressure chronic ulcer of other part of left lower leg with fat layer exposed D50.9 Iron deficiency anemia, unspecified J45.998 Other asthma Discharge From [facility] Wound #1 Left,Anterior Lower Leg o Discharge from [facility] - treatment complete, apple farro wrap to left leg Electronic Signature(s) Signed: 12/24/2018 3:17:07 PM By: Rodell Perna Signed: 12/26/2018 11:30:14 PM By: Lenda Kelp PA-C Entered By: Rodell Perna on 12/23/2018 15:05:14 Johnathan Scott ([id number]) -------------------------------------------------------------------------------- Problem List Details Patient Name: Johnathan Scott Date of Service: 12/23/2018 2:30 PM Medical Record Number: [id number] Patient Account Number: [id number] Date of Birth/Sex: [birth year] (53 y.o. M) Treating RN: Rodell Perna Primary Care Provider: Birdie Sons, ERIC Other Clinician: Referring Provider: Birdie Sons, ERIC Treating Provider/Extender: Linwood Dibbles, Katelee Schupp Weeks in Treatment: 19 Active Problems ICD-10 Evaluated Encounter Code Description Active Date Today Diagnosis I87.2 Venous insufficiency (chronic) (peripheral) 08/09/2018 No Yes L97.822 Non-pressure chronic ulcer of other part of left lower leg with 08/09/2018 No Yes fat layer exposed D50.9 Iron deficiency anemia, unspecified  08/09/2018 No Yes J45.998 Other asthma 08/09/2018 No Yes Inactive Problems Resolved Problems Electronic Signature(s) Signed: 12/26/2018 11:30:14 PM By: Lenda Kelp PA-C Entered By: Lenda Kelp on 12/23/2018 15:02:35 Johnathan Scott ([id number]) -------------------------------------------------------------------------------- Progress Note Details Patient Name: Johnathan Scott Date of Service: 12/23/2018 2:30 PM Medical Record Number: [id number] Patient Account Number: [id number] Date of Birth/Sex: 1966-08-15 (53 y.o. M) Treating RN: Rodell Perna Primary Care Provider: Birdie Sons ERIC Other Clinician: Referring Provider: Birdie Sons, ERIC Treating Provider/Extender: Linwood Dibbles, Mele Sylvester Weeks in Treatment: 19 Subjective Chief Complaint Information obtained from Patient Left Anterior LE Ulcer History of Present Illness (HPI) 08/09/18 on evaluation today patient presents for initial evaluation and clinic as referral concerning an ulcer that he has on the left anterior lower Trinity which has been present for five months. He states that he actually noticed this after having an "insect bite" while driving down the road. He states that he did not see what kind of insect it was but he felt it wrong up his leg and then have the bite. Since that time is been having issues with the fact that this area does not want to heal. He does have a history of asthma as well as anemia is most recent CBC revealed a hemoglobin of 11.1 with a hematocrit of 34.6. He does have a history of panic attacks as well intermittently. No fevers, chills, nausea, or vomiting noted at this time. Specifically the patient does not appear to have any infection in regard to the left lower extremity. He's not on the current antibiotics. 08/16/18  upon evaluation today patient actually appears to be doing significantly better in regard to his wound. He has been tolerating the dressing changes that he was performing on his own over  the last week. Subsequently we did not wrap his leg at that time although we discussed it. We weren't sure that he was gonna be able to get his pants on top of the dressing. Nonetheless the patient fortunately came today with shorts on he states that he is willing to be wrapped if it will help the wound to heal faster. He is pleased with the fact that the wound already appears to be doing better it's also not hurting as badly. The last debridement was a little bit more uncomfortable for him unfortunately. Nonetheless he states he's willing to allow me to debride it again today if I do so carefully. 08/24/18 upon evaluation today patient actually appears to be doing rather well in regard to his lower Trinity ulcer on the left. He has been tolerating the dressing changes with Iodoflex including the wrap without complication and the wound [place] into making signs of good progress. Overall I'm very pleased with how things appear at this time. No fevers, chills, nausea, or vomiting noted at this time. 09/03/18 on evaluation today patient actually appears to be doing rather well in regard to his wound. In fact the overall measurement belies the fact that the wound is actually healed quite a bit more than what the measurement would suggest. In fact it's almost half the size that it was during the last evaluation unfortunately a lot of the area where new skin has grown over is in between two areas that had to be clustered together one of the distal portion of the wound has not completely healed up. Nonetheless there is actually quite a bit more healing than what seems to be represented by the wound measurement today. Overall I'm very pleased with the overall progress and how he seems to be doing with the dressing changes. The wrap seems to be of great benefit for him as well. 09/09/18 upon evaluation today patient actually appears to be doing excellent in regard to his left lotion the ulcer. He has been  tolerating the dressing changes without complication. There does not appear to be any evidence of infection overall I think the [facility] Dressing has done excellent for him. I'm very pleased. 09/23/18 evaluation today patient actually appears to be doing poorly in regard to his lower extremity ulcer. There's a lot of maceration and skin breakdown around the actual wound itself which is making it difficult to even tell exactly what is going on. This is something he states started about four days ago. Nonetheless I'm concerned about the possibility of infection just being that he was not having near this amount of drainage previous. He also states in the past 24 hours the pain has been a little bit more significant. No fevers, chills, nausea, or vomiting noted at this time. 09/30/18 on evaluation today patient actually appears to be doing poorly in regard to his lower extremity ulcer. He's been tolerating the dressing changes although there is a lot of tape irritation surrounding the wound area. Subsequently I do think [facility] [place] ([id number]) that he would benefit from [place] initiating treatment with all the compression wrap as we did before to avoid any additional injury to the area. No fevers, chills, nausea, or vomiting noted at this time. The patient did have a return of his wound culture which showed Pseudomonas  which will not be covered by the Bactrim that I previously prescribed for him. With that being said the patient states that he did not know I was sitting in a prescription for him and therefore never picked this up he also states the pharmacy did not call him. Nonetheless this was something that was sent we will call the pharmacy and having counseling as I'm gonna have to send them something different. 10/08/18 on evaluation today patient's ulcer still appears to be much larger than what it was previous to the deterioration. With that being said the erythema surrounding the wound  bed is not nearly as significant I do believe the antibiotic has been of help for him. He did finally get the Levaquin which is good news. No fevers, chills, nausea, or vomiting noted at this time. 10/12/18 on evaluation today patient actually appears to be doing much better in regard to his lower extremity ulcer. Overall I do believe the dressing change has done well for him at this time. I'm pleased with the overall progress even since just in the last week. 10/19/18 Seen today for follow up and management of left lower leg wound. Wound looks to be improving today and measuring smaller. Decreased surrounding erythema. No s/s of infection. Denies any increased pain, fever, or chills. 10/26/18 on evaluation today patient's wound actually appears to be doing very well. Unfortunately he has a new wound on the medial aspect of his lower extremity that is due to the wrap having slid down. He did not contact us as he did not know whether or not we be able to work him in. Nonetheless I told him in the future if this happens the definitely call us and let us know. 11/01/18 on evaluation today patient's lower Trinity ulcer appears to show signs of improvement compared to last week's evaluation. In fact the new area which blistered and arose last week appears to likely be completely healed at this time. Fortunately there is no sign of infection which is also good news. Overall I'm very pleased with how things seem to be progressing. 11/08/18 on evaluation today patient appears to be doing better in regard to his lower extremity ulcer. Fortunately there is no evidence of infection at this time. Overall he seems to be making good progress which is good news. 11/15/18 on evaluation today patient appears to be doing very well at this point in regard to left anterior lower Trinity ulcer. This is not completely healed but does seem to be progressing in an appropriate fashion at this time. Fortunately there is no  sign of infection currently. No fevers, chills, nausea, or vomiting noted at this time. 11/26/2018 Seen today for follow-up and management of left lower leg anterior ulcer. Wound continues to be progressing well. There is no evidence of infection. He did express wanting to end wound care and just to apply a Band-Aid over the wound. Discussed healing process and the need for ongoing compliance with wound care. He denies any immediate concerns during visit. No recent fevers, chills, or shortness of breath. 12/02/18 on evaluation today patient actually appears to be doing very well in regard to his lower extremity ulcer which is shown signs of good improvement. Fortunately there is no sign of infection at this time. Patient has been tolerating the dressing changes without complication. I'm very pleased with how things have been progressing. No fevers, chills, nausea, or vomiting noted at this time. 12/09/18 on evaluation today patient continues to made excellent progress  in regard to his left lower extremity ulcer. He's been tolerating the dressing changes without complication. The wrap does seem to be doing his job and is doing so excellently. The biggest issue is he is having this work to come in to get the wrap change. Fortunately there's no signs of infection at this time which is good news. 12/16/18 on evaluation today patient actually appears to be doing excellent in regard to his lower extremity ulcer. Fact this appears to be almost completely healed. There's just a very small opening still remaining the upper portion of the wound otherwise everything else shows to be closed and there is significant epithelialization noted in general. I'm overall very pleased with how things seem to be improving. I think that he will be healed by next week. 12/23/18 on evaluation today patient's wound actually appears to be completely healed at this point which is excellent news. He has been tolerating the dressing  changes without complication and fortunately has done extremely well as far as I'm concerned. Overall I feel like that he will continue to do well with a compression wrapping which he can use his Velcro compression wrap for the next couple of weeks and then switch over to a compression stocking which I would recommend indefinitely. Johnathan Scott, Johnathan Scott ([id number]) Patient History Information obtained from Patient. Family History Cancer - Mother,Father,Siblings, Diabetes - Siblings, Heart Disease - Siblings, Hypertension - Siblings, No family history of Hereditary Spherocytosis, Kidney Disease, Lung Disease, Seizures, Stroke, Thyroid Problems, Tuberculosis. Social History Former smoker - 7 years, Marital Status - Single, Alcohol Use - Rarely, Drug Use - No History, Caffeine Use - Never. Medical History Eyes Denies history of Cataracts, Glaucoma, Optic Neuritis Ear/Nose/Mouth/Throat Denies history of Chronic sinus problems/congestion, Middle ear problems Hematologic/Lymphatic Denies history of Anemia, Hemophilia, Human Immunodeficiency Virus, Lymphedema, Sickle Cell Disease Respiratory Patient has history of Asthma - history as a child Denies history of Aspiration, Chronic Obstructive Pulmonary Disease (COPD), Pneumothorax, Sleep Apnea, Tuberculosis Cardiovascular Denies history of Angina, Arrhythmia, Congestive Heart Failure, Coronary Artery Disease, Deep Vein Thrombosis, Hypertension, Hypotension, Myocardial Infarction, Peripheral Arterial Disease, Peripheral Venous Disease, Phlebitis, Vasculitis Gastrointestinal Denies history of Cirrhosis , Colitis, Crohn s, Hepatitis A, Hepatitis B, Hepatitis C Endocrine Denies history of Type I Diabetes, Type II Diabetes Genitourinary Denies history of End Stage Renal Disease Immunological Denies history of Lupus Erythematosus, Raynaud s, Scleroderma Integumentary (Skin) Denies history of History of Burn, History of pressure  wounds Musculoskeletal Patient has history of Osteoarthritis - both hips Denies history of Gout, Rheumatoid Arthritis, Osteomyelitis Neurologic Patient has history of Seizure Disorder - as a child Denies history of Dementia, Neuropathy, Quadriplegia, Paraplegia Oncologic Denies history of Received Chemotherapy, Received Radiation Psychiatric Denies history of Anorexia/bulimia, Confinement Anxiety Review of Systems (ROS) Constitutional Symptoms (General Health) Denies complaints or symptoms of Fever, Chills. Respiratory The patient has no complaints or symptoms. Cardiovascular Complains or has symptoms of LE edema. Psychiatric The patient has no complaints or symptoms. Johnathan Scott, Johnathan Scott ([id number]) Objective Constitutional Well-nourished and well-hydrated in no acute distress. Vitals Time Taken: 2:40 PM, Height: 61 in, Weight: 232 lbs, BMI: 43.8, Temperature: 97.5 F, Pulse: 56 bpm, Respiratory Rate: 18 breaths/min, Blood Pressure: 117/57 mmHg. Respiratory normal breathing without difficulty. Psychiatric this patient is able to make decisions and demonstrates good insight into disease process. Alert and Oriented x 3. pleasant and cooperative. General Notes: Patient's wound currently shows signs of complete epithelialization everything appears to be doing excellent at this point. There is no sign of infection  currently. Integumentary (Hair, Skin) Wound #1 status is Healed - Epithelialized. Original cause of wound was Gradually Appeared. The wound is located on the Left,Anterior Lower Leg. The wound measures 0cm length x 0cm width x 0cm depth; 0cm^2 area and 0cm^3 volume. There is no tunneling or undermining noted. There is a none present amount of drainage noted. The wound margin is flat and intact. There is large (67-100%) red granulation within the wound bed. There is no necrotic tissue within the wound bed. The periwound skin appearance exhibited: Dry/Scaly, Hemosiderin  Staining. The periwound skin appearance did not exhibit: Callus, Crepitus, Excoriation, Induration, Rash, Scarring, Maceration, Atrophie Blanche, Cyanosis, Ecchymosis, Mottled, Pallor, Rubor, Erythema. Periwound temperature was noted as No Abnormality. Assessment Active Problems ICD-10 Venous insufficiency (chronic) (peripheral) Non-pressure chronic ulcer of other part of left lower leg with fat layer exposed Iron deficiency anemia, unspecified Other asthma Plan Discharge From [facility] Services: Wound #1 Left,Anterior Lower Leg: Discharge from [facility] - treatment complete, apple farro wrap to left leg Casselman, Keilyn ([id number]) Currently I'm gonna do recommend that we discontinue wound care services and the patient is in agreement with that plan. We will subsequently see were things stand in the future as needed anything changes or worsens he's in agreement with this plan. I did recommend that he should be using compression stockings of one type or another indefinitely at this point. He can use the FarrowWrap 4000 for now and then switch over to compression stockings in about a month. He's in agreement this plan. Electronic Signature(s) Signed: 12/26/2018 11:30:14 PM By: Lenda Kelp PA-C Entered By: Lenda Kelp on 12/25/2018 06:53:12 Johnathan Scott ([id number]) -------------------------------------------------------------------------------- ROS/PFSH Details Patient Name: Johnathan Scott Date of Service: 12/23/2018 2:30 PM Medical Record Number: [id number] Patient Account Number: [id number] Date of Birth/Sex: [birth year] (53 y.o. M) Treating RN: Rodell Perna Primary Care Provider: Birdie Sons, ERIC Other Clinician: Referring Provider: Birdie Sons, ERIC Treating Provider/Extender: Linwood Dibbles, Willisha Sligar Weeks in Treatment: 19 Information Obtained From Patient Wound History Do you currently have one or more open woundso Yes How many open wounds do you currently haveo  1 Approximately how long have you had your woundso 5 months How have you been treating your wound(s) until nowo open to air Has your wound(s) ever healed and then re-openedo No Have you had any lab work done in the past montho No Have you tested positive for an antibiotic resistant organism (MRSA, VRE)o No Have you tested positive for osteomyelitis (bone infection)o No Have you had any tests for circulation on your legso No Have you had other problems associated with your woundso Swelling Constitutional Symptoms (General Health) Complaints and Symptoms: Negative for: Fever; Chills Cardiovascular Complaints and Symptoms: Positive for: LE edema Medical History: Negative for: Angina; Arrhythmia; Congestive Heart Failure; Coronary Artery Disease; Deep Vein Thrombosis; Hypertension; Hypotension; Myocardial Infarction; Peripheral Arterial Disease; Peripheral Venous Disease; Phlebitis; Vasculitis Eyes Medical History: Negative for: Cataracts; Glaucoma; Optic Neuritis Ear/Nose/Mouth/Throat Medical History: Negative for: Chronic sinus problems/congestion; Middle ear problems Hematologic/Lymphatic Medical History: Negative for: Anemia; Hemophilia; Human Immunodeficiency Virus; Lymphedema; Sickle Cell Disease Respiratory Complaints and Symptoms: No Complaints or Symptoms Johnathan Scott, Johnathan Scott ([id number]) Medical History: Positive for: Asthma - history as a child Negative for: Aspiration; Chronic Obstructive Pulmonary Disease (COPD); Pneumothorax; Sleep Apnea; Tuberculosis Gastrointestinal Medical History: Negative for: Cirrhosis ; Colitis; Crohnos; Hepatitis A; Hepatitis B; Hepatitis C Endocrine Medical History: Negative for: Type I Diabetes; Type II Diabetes Genitourinary Medical History: Negative for: End Stage Renal Disease Immunological Medical History: Negative  for: Lupus Erythematosus; Raynaudos; Scleroderma Integumentary (Skin) Medical History: Negative for: History of Burn;  History of pressure wounds Musculoskeletal Medical History: Positive for: Osteoarthritis - both hips Negative for: Gout; Rheumatoid Arthritis; Osteomyelitis Neurologic Medical History: Positive for: Seizure Disorder - as a child Negative for: Dementia; Neuropathy; Quadriplegia; Paraplegia Oncologic Medical History: Negative for: Received Chemotherapy; Received Radiation Psychiatric Complaints and Symptoms: No Complaints or Symptoms Medical History: Negative for: Anorexia/bulimia; Confinement Anxiety Immunizations Pneumococcal Vaccine: Received Pneumococcal Vaccination: No Implantable Devices Johnathan Scott, Johnathan Scott ([id number]) No devices added Family and Social History Cancer: Yes - Mother,Father,Siblings; Diabetes: Yes - Siblings; Heart Disease: Yes - Siblings; Hereditary Spherocytosis: No; Hypertension: Yes - Siblings; Kidney Disease: No; Lung Disease: No; Seizures: No; Stroke: No; Thyroid Problems: No; Tuberculosis: No; Former smoker - 7 years; Marital Status - Single; Alcohol Use: Rarely; Drug Use: No History; Caffeine Use: Never; Financial Concerns: No; Food, Clothing or Shelter Needs: No; Support System Lacking: No; Transportation Concerns: No; Advanced Directives: No; Patient does not want information on Advanced Directives Physician Affirmation I have reviewed and agree with the above information. Electronic Signature(s) Signed: 12/26/2018 11:30:14 PM By: Lenda Kelp PA-C Signed: 12/27/2018 8:21:26 AM By: Rodell Perna Entered By: Lenda Kelp on 12/25/2018 06:52:22 Johnathan Scott ([id number]) -------------------------------------------------------------------------------- SuperBill Details Patient Name: Johnathan Scott Date of Service: 12/23/2018 Medical Record Number: [id number] Patient Account Number: [id number] Date of Birth/Sex: [birth year] (53 y.o. M) Treating RN: Rodell Perna Primary Care Provider: Birdie Sons, ERIC Other Clinician: Referring Provider: Birdie Sons,  ERIC Treating Provider/Extender: Linwood Dibbles, Abdel Effinger Weeks in Treatment: 19 Diagnosis Coding ICD-10 Codes Code Description I87.2 Venous insufficiency (chronic) (peripheral) I87.2 Non-pressure chronic ulcer of other part of left lower leg with fat layer exposed I87.2 Iron deficiency anemia, unspecified I87.2 Other asthma Facility Procedures CPT4 Code: [id number] Description: 25956 - WOUND CARE VISIT-LEV 2 EST PT Modifier: Quantity: 1 Physician Procedures CPT4 Code Description: 3875643 99213 - WC PHYS LEVEL 3 - EST PT ICD-10 Diagnosis Description I87.2 Venous insufficiency (chronic) (peripheral) L97.822 Non-pressure chronic ulcer of other part of left lower leg wit D50.9 Iron deficiency anemia,  unspecified J45.998 Other asthma Modifier: h fat layer expos Quantity: 1 ed Electronic Signature(s) Signed: 12/26/2018 11:30:14 PM By: Lenda Kelp PA-C Entered By: Lenda Kelp on 12/25/2018 06:53:30

## 2018-12-28 LAB — BASIC METABOLIC PANEL
BUN: 32 mg/dL — ABNORMAL HIGH (ref 6–23)
CALCIUM: 9.8 mg/dL (ref 8.4–10.5)
CO2: 24 mEq/L (ref 19–32)
Chloride: 103 mEq/L (ref 96–112)
Creatinine, Ser: 1.37 mg/dL (ref 0.40–1.50)
GFR: 53.96 mL/min — AB (ref >60.00)
Glucose, Bld: 106 mg/dL — ABNORMAL HIGH (ref 70–99)
POTASSIUM: 4.7 meq/L (ref 3.5–5.1)
Sodium: 137 mEq/L (ref 135–145)

## 2018-12-28 LAB — FERRITIN: FERRITIN: 49.8 ng/mL (ref 22.0–322.0)

## 2019-01-03 ENCOUNTER — Telehealth: Payer: Self-pay | Admitting: Family Medicine

## 2019-01-03 NOTE — Telephone Encounter (Signed)
Pt. Given results and reports he has been taking the lisinopril. States he is now having urinary frequency and some diarrhea. Please advise pt.

## 2019-01-04 ENCOUNTER — Other Ambulatory Visit: Payer: Self-pay | Admitting: Family Medicine

## 2019-01-04 DIAGNOSIS — I10 Essential (primary) hypertension: Secondary | ICD-10-CM

## 2019-01-04 NOTE — Telephone Encounter (Signed)
Called pt and left a VM to call back to discuss symptoms. CRM created and sent to [facility] pool.

## 2019-01-05 NOTE — Telephone Encounter (Signed)
Called and spoke with patient. Pt stated that he stopped taking the lisinopril on Sunday and had been on the medication for about two weeks.   Pt is only c/o a sinus headaches today no other symptoms or concerns.   Pt stated that all of his other signs and symptoms resoled once he stopped the medication.   Pt stated that with the urine frequency he was getting up at night 2-3 times to void and the other night he slept well and only woke up once to go void. No other urinary symptoms or concerns of a UTI.   No long having any more diarrhea. No longer dealing with left arm pain, pt stated that they slept well Monday night and no longer feel fatigue.   Sent to PCP

## 2019-01-05 NOTE — Telephone Encounter (Signed)
Called patient to triage and to if advisable to add schedule of Leanora Cover NP if patient agreeable no answer left voicemail. PEC may schedule.

## 2019-01-05 NOTE — Telephone Encounter (Signed)
Noted. Lisinopril is no longer a good option for the patient. He should remain off the medication. I would like to add an alternative medication called amlodipine. If he is willing to start on this I can send it to his pharmacy.

## 2019-01-05 NOTE — Telephone Encounter (Signed)
Patient said his symptoms are headaches , diarrhea , pain in left arm and fatigue.

## 2019-01-06 NOTE — Telephone Encounter (Signed)
Noted  

## 2019-01-06 NOTE — Telephone Encounter (Signed)
Called and spoke with pt. Pt does not care to start any other medication at this time. Sent to PCP as an [place].

## 2019-02-24 ENCOUNTER — Telehealth: Payer: Self-pay | Admitting: Family Medicine

## 2019-02-24 NOTE — Telephone Encounter (Signed)
Pt called Office The patient called to the office to cancel his up coming appointment. I informed the patient that he did not have to come into the office and that he could have a virtual visit. He then stated what was the appointment for , I asked if he was taking any medication , he stated that he stop taking that stuff,because it made him feel bad. I asked which medication did he stop taking he informed me that it was for his blood pressure. I informed him that stopping the medication was dangerous and he needed to talk to the provider to get another medication if that medication mad him feel bad. He stated that he wasn't dead yet and he did not need the medication. I stated that I would send his provider a message regarding his decision.

## 2019-02-24 NOTE — Telephone Encounter (Addendum)
The patient called to the office to cancel his up coming appointment. I informed the patient that he did not have to come into the office and that he could have a virtual visit. He then stated what was the appointment for , I asked if he was taking any medication , he stated that he stop taking that stuff,because it made him feel bad. I asked which medication did he stop taking he informed me that it was for his blood pressure. I informed him that stopping the medication was dangerous and he needed to talk to the provider to get another medication if that medication mad him feel bad. He stated that he wasn't dead yet and he did not need the medication. I stated that I would send his provider a message regarding his decision.

## 2019-02-24 NOTE — Telephone Encounter (Signed)
Noted. Please try to reschedule him for follow-up on his BP at some point in the next 1-3 months. If he does not want to schedule within that time frame please see when he would be willing to schedule an appointment. Thanks.

## 2019-02-25 NOTE — Telephone Encounter (Signed)
Called and spoke with pt. Pt declined to schedule an appt.

## 2019-03-01 ENCOUNTER — Ambulatory Visit: Payer: 59 | Admitting: Family Medicine

## 2019-06-10 NOTE — Telephone Encounter (Signed)
err

## 2019-09-28 ENCOUNTER — Telehealth: Payer: Self-pay | Admitting: *Deleted

## 2019-09-28 NOTE — Telephone Encounter (Signed)
This patient called and stated that his job is making it Mandatory to wear a mask to work and he states he cannot wear a mask, he states he put one on and he starts sweating really bad and he can't breath in it the job is stating that he has to have a doctors note to not wear one. The patient states this mandate starts tomorrow at his job so he will need a note from you ASAP or he cannot work. His number if you want to call is [phone]   Nina,cma

## 2019-09-28 NOTE — Telephone Encounter (Signed)
Copied from [place] [phone]. Topic: General - Other >> Sep 28, 2019 11:57 AM Keene Breath wrote: Reason for CRM: Patient would like the doctor or nurse to call him because his employer has made it mandatory to wear a mask and the patient said he has problems breathing with the mask.  Can the doctor write an excuse for him.  Please call to discuss at [phone]

## 2019-09-29 NOTE — Telephone Encounter (Signed)
He cancelled the appointment.  Nina,cma

## 2019-09-29 NOTE — Telephone Encounter (Signed)
The patient does not appear to have any underlying medical condition that would preclude him from wearing a mask. I would suggest a referral to a specialist or a virtual visit with him if he is having trouble breathing though completing a visit with me or a specialist does not mean he will receive an exemption from wearing a mask. A visit will allow [place] to evaluate for possible causes of his reported issues.

## 2019-09-29 NOTE — Telephone Encounter (Signed)
Noted. He is scheduled for tomorrow for a visit. Please make sure he does not need that visit and if he does not want to keep it please cancel it. Thanks.

## 2019-09-29 NOTE — Telephone Encounter (Signed)
Patient stated he has a visor to wear instead of the mask, so he does not need a visit at this time, he will see how this works out.  Nina,cma

## 2019-09-30 ENCOUNTER — Ambulatory Visit: Payer: 59 | Admitting: Family Medicine

## 2020-01-09 ENCOUNTER — Telehealth: Payer: Self-pay | Admitting: Family Medicine

## 2020-01-09 NOTE — Telephone Encounter (Signed)
I called and spoke with the patient and he states he slept on his left arm on Friday and since then he has not been able to use his fingers to pick things up at his job, he states his fingers seems to not want to work.  He is scheduled to see Dr. French Ana Mclean-Scoccuzza tomorrow.  Aneira Cavitt,cma

## 2020-01-09 NOTE — Telephone Encounter (Signed)
Pt called in and said he has had nose bleeds and he is saying his fingers aren't working like they supposed too. He said he can't pick up things like he used too. Please advise.

## 2020-01-10 ENCOUNTER — Ambulatory Visit (INDEPENDENT_AMBULATORY_CARE_PROVIDER_SITE_OTHER): Payer: 59

## 2020-01-10 ENCOUNTER — Ambulatory Visit (INDEPENDENT_AMBULATORY_CARE_PROVIDER_SITE_OTHER): Payer: 59 | Admitting: Internal Medicine

## 2020-01-10 ENCOUNTER — Other Ambulatory Visit: Payer: Self-pay

## 2020-01-10 ENCOUNTER — Ambulatory Visit: Payer: 59

## 2020-01-10 ENCOUNTER — Encounter: Payer: Self-pay | Admitting: Internal Medicine

## 2020-01-10 VITALS — BP 146/90 | HR 64 | Temp 97.1°F | Ht 61.5 in | Wt 214.2 lb

## 2020-01-10 DIAGNOSIS — M62838 Other muscle spasm: Secondary | ICD-10-CM

## 2020-01-10 DIAGNOSIS — M542 Cervicalgia: Secondary | ICD-10-CM

## 2020-01-10 DIAGNOSIS — E611 Iron deficiency: Secondary | ICD-10-CM

## 2020-01-10 DIAGNOSIS — Z1159 Encounter for screening for other viral diseases: Secondary | ICD-10-CM

## 2020-01-10 DIAGNOSIS — T148XXA Other injury of unspecified body region, initial encounter: Secondary | ICD-10-CM

## 2020-01-10 DIAGNOSIS — M545 Low back pain, unspecified: Secondary | ICD-10-CM

## 2020-01-10 DIAGNOSIS — R29898 Other symptoms and signs involving the musculoskeletal system: Secondary | ICD-10-CM

## 2020-01-10 DIAGNOSIS — R739 Hyperglycemia, unspecified: Secondary | ICD-10-CM

## 2020-01-10 DIAGNOSIS — M75102 Unspecified rotator cuff tear or rupture of left shoulder, not specified as traumatic: Secondary | ICD-10-CM

## 2020-01-10 DIAGNOSIS — Z1389 Encounter for screening for other disorder: Secondary | ICD-10-CM

## 2020-01-10 DIAGNOSIS — E559 Vitamin D deficiency, unspecified: Secondary | ICD-10-CM

## 2020-01-10 DIAGNOSIS — M25512 Pain in left shoulder: Secondary | ICD-10-CM | POA: Diagnosis not present

## 2020-01-10 DIAGNOSIS — I10 Essential (primary) hypertension: Secondary | ICD-10-CM | POA: Diagnosis not present

## 2020-01-10 DIAGNOSIS — Z1329 Encounter for screening for other suspected endocrine disorder: Secondary | ICD-10-CM | POA: Diagnosis not present

## 2020-01-10 DIAGNOSIS — I890 Lymphedema, not elsewhere classified: Secondary | ICD-10-CM | POA: Insufficient documentation

## 2020-01-10 DIAGNOSIS — Z13818 Encounter for screening for other digestive system disorders: Secondary | ICD-10-CM

## 2020-01-10 DIAGNOSIS — M7542 Impingement syndrome of left shoulder: Secondary | ICD-10-CM

## 2020-01-10 DIAGNOSIS — Z125 Encounter for screening for malignant neoplasm of prostate: Secondary | ICD-10-CM | POA: Diagnosis not present

## 2020-01-10 MED ORDER — CYCLOBENZAPRINE HCL 5 MG PO TABS: 5.0000 mg | ORAL_TABLET | Freq: Every evening | ORAL | 0 refills | Status: DC | PRN

## 2020-01-10 MED ORDER — MUPIROCIN 2 % EX OINT: 1.0000 "application " | TOPICAL_OINTMENT | Freq: Three times a day (TID) | CUTANEOUS | 0 refills | Status: DC

## 2020-01-10 MED ORDER — PREDNISONE 20 MG PO TABS: 40.0000 mg | ORAL_TABLET | Freq: Every day | ORAL | 0 refills | Status: DC

## 2020-01-10 MED ORDER — AMLODIPINE BESYLATE 5 MG PO TABS: 5.0000 mg | ORAL_TABLET | Freq: Every day | ORAL | 3 refills | Status: DC

## 2020-01-10 NOTE — Patient Instructions (Addendum)
Amlodipine Oral Tablets What is this medicine? AMLODIPINE (am LOE di peen) is a calcium channel blocker. It relaxes your blood vessels and decreases the amount of work the heart has to do. It treats high blood pressure and/or prevents chest pain (also called angina). This medicine may be used for other purposes; ask your health care provider or pharmacist if you have questions. COMMON BRAND NAME(S): Norvasc What should I tell my health care provider before I take this medicine? They need to know if you have any of these conditions:  heart disease  liver disease  an unusual or allergic reaction to amlodipine, other drugs, foods, dyes, or preservatives  pregnant or trying to get pregnant  breast-feeding How should I use this medicine? Take this drug by mouth. Take it as directed on the prescription label at the same time every day. You can take it with or without food. If it upsets your stomach, take it with food. Keep taking it unless your health care provider tells you to stop. Talk to your health care provider about the use of this drug in children. While it may be prescribed for children as young as 6 for selected conditions, precautions do apply. Overdosage: If you think you have taken too much of this medicine contact a poison control center or emergency room at once. NOTE: This medicine is only for you. Do not share this medicine with others. What if I miss a dose? If you miss a dose, take it as soon as you can. If it is almost time for your next dose, take only that dose. Do not take double or extra doses. What may interact with this medicine? This medicine may interact with the following medications:  clarithromycin  cyclosporine  diltiazem  itraconazole  simvastatin  tacrolimus This list may not describe all possible interactions. Give your health care provider a list of all the medicines, herbs, non-prescription drugs, or dietary supplements you use. Also tell them if  you smoke, drink alcohol, or use illegal drugs. Some items may interact with your medicine. What should I watch for while using this medicine? Visit your health care provider for regular checks on your progress. Check your blood pressure as directed. Ask your health care provider what your blood pressure should be. Also, find out when you should contact him or her. Do not treat yourself for coughs, colds, or pain while you are using this drug without asking your health care provider for advice. Some drugs may increase your blood pressure. You may get drowsy or dizzy. Do not drive, use machinery, or do anything that needs mental alertness until you know how this drug affects you. Do not stand up or sit up quickly, especially if you are an older patient. This reduces the risk of dizzy or fainting spells. What side effects may I notice from receiving this medicine? Side effects that you should report to your doctor or health care provider as soon as possible:  allergic reactions (skin rash, itching or hives; swelling of the face, lips, or tongue)  heart attack (trouble breathing; pain or tightness in the chest, neck, back or arms; unusually weak or tired)  low blood pressure (dizziness; feeling faint or lightheaded, falls; unusually weak or tired) Side effects that usually do not require medical attention (report these to your doctor or health care provider if they continue or are bothersome):  facial flushing  nausea  palpitations  stomach pain  sudden weight gain  swelling of the ankles, feet, hands  This list may not describe all possible side effects. Call your doctor for medical advice about side effects. You may report side effects to FDA at 1-800-FDA-1088. Where should I keep my medicine? Keep out of the reach of children and pets. Store at room temperature between 59 and 86 degrees F (15 and 30 degrees C). Protect from light and moisture. Keep the container tightly closed. Throw away  any unused drug after the expiration date. NOTE: This sheet is a summary. It may not cover all possible information. If you have questions about this medicine, talk to your doctor, pharmacist, or health care provider.  2020 Elsevier/Gold Standard (2019-07-12 19:39:45)  Shoulder Pain Many things can cause shoulder pain, including:  An injury to the shoulder.  Overuse of the shoulder.  Arthritis. The source of the pain can be:  Inflammation.  An injury to the shoulder joint.  An injury to a tendon, ligament, or bone. Follow these instructions at home: Pay attention to changes in your symptoms. Let your health care provider know about them. Follow these instructions to relieve your pain. If you have a sling:  Wear the sling as told by your health care provider. Remove it only as told by your health care provider.  Loosen the sling if your fingers tingle, become numb, or turn cold and blue.  Keep the sling clean.  If the sling is not waterproof: ? Do not let it get wet. Remove it to shower or bathe.  Move your arm as little as possible, but keep your hand moving to prevent swelling. Managing pain, stiffness, and swelling   If directed, put ice on the painful area: ? Put ice in a plastic bag. ? Place a towel between your skin and the bag. ? Leave the ice on for 20 minutes, 2-3 times per day. Stop applying ice if it does not help with the pain.  Squeeze a soft ball or a foam pad as much as possible. This helps to keep the shoulder from swelling. It also helps to strengthen the arm. General instructions  Take over-the-counter and prescription medicines only as told by your health care provider.  Keep all follow-up visits as told by your health care provider. This is important. Contact a health care provider if:  Your pain gets worse.  Your pain is not relieved with medicines.  New pain develops in your arm, hand, or fingers. Get help right away if:  Your arm, hand,  or fingers: ? Tingle. ? Become numb. ? Become swollen. ? Become painful. ? Turn white or blue. Summary  Shoulder pain can be caused by an injury, overuse, or arthritis.  Pay attention to changes in your symptoms. Let your health care provider know about them.  This condition may be treated with a sling, ice, and pain medicines.  Contact your health care provider if the pain gets worse or new pain develops. Get help right away if your arm, hand, or fingers tingle or become numb, swollen, or painful.  Keep all follow-up visits as told by your health care provider. This is important. This information is not intended to replace advice given to you by your health care provider. Make sure you discuss any questions you have with your health care provider. Document Revised: 04/20/2018 Document Reviewed: 04/20/2018 Elsevier Patient Education  2020 [employer]  Neck Exercises Ask your health care provider which exercises are safe for you. Do exercises exactly as told by your health care provider and adjust them as directed.  It is normal to feel mild stretching, pulling, tightness, or discomfort as you do these exercises. Stop right away if you feel sudden pain or your pain gets worse. Do not begin these exercises until told by your health care provider. Neck exercises can be important for many reasons. They can improve strength and maintain flexibility in your neck, which will help your upper back and prevent neck pain. Stretching exercises Rotation neck stretching  1. Sit in a chair or stand up. 2. Place your feet flat on the floor, shoulder width apart. 3. Slowly turn your head (rotate) to the right until a slight stretch is felt. Turn it all the way to the right so you can look over your right shoulder. Do not tilt or tip your head. 4. Hold this position for 10-30 seconds. 5. Slowly turn your head (rotate) to the left until a slight stretch is felt. Turn it all the way to the left so you  can look over your left shoulder. Do not tilt or tip your head. 6. Hold this position for 10-30 seconds. Repeat __________ times. Complete this exercise __________ times a day. Neck retraction 1. Sit in a sturdy chair or stand up. 2. Look straight ahead. Do not bend your neck. 3. Use your fingers to push your chin backward (retraction). Do not bend your neck for this movement. Continue to face straight ahead. If you are doing the exercise properly, you will feel a slight sensation in your throat and a stretch at the back of your neck. 4. Hold the stretch for 1-2 seconds. Repeat __________ times. Complete this exercise __________ times a day. Strengthening exercises Neck press 1. Lie on your back on a firm bed or on the floor with a pillow under your head. 2. Use your neck muscles to push your head down on the pillow and straighten your spine. 3. Hold the position as well as you can. Keep your head facing up (in a neutral position) and your chin tucked. 4. Slowly count to 5 while holding this position. Repeat __________ times. Complete this exercise __________ times a day. Isometrics These are exercises in which you strengthen the muscles in your neck while keeping your neck still (isometrics). 1. Sit in a supportive chair and place your hand on your forehead. 2. Keep your head and face facing straight ahead. Do not flex or extend your neck while doing isometrics. 3. Push forward with your head and neck while pushing back with your hand. Hold for 10 seconds. 4. Do the sequence again, this time putting your hand against the back of your head. Use your head and neck to push backward against the hand pressure. 5. Finally, do the same exercise on either side of your head, pushing sideways against the pressure of your hand. Repeat __________ times. Complete this exercise __________ times a day. Prone head lifts 1. Lie face-down (prone position), resting on your elbows so that your chest and upper  back are raised. 2. Start with your head facing downward, near your chest. Position your chin either on or near your chest. 3. Slowly lift your head upward. Lift until you are looking straight ahead. Then continue lifting your head as far back as you can comfortably stretch. 4. Hold your head up for 5 seconds. Then slowly lower it to your starting position. Repeat __________ times. Complete this exercise __________ times a day. Supine head lifts 1. Lie on your back (supine position), bending your knees to point to the ceiling and  keeping your feet flat on the floor. 2. Lift your head slowly off the floor, raising your chin toward your chest. 3. Hold for 5 seconds. Repeat __________ times. Complete this exercise __________ times a day. Scapular retraction 1. Stand with your arms at your sides. Look straight ahead. 2. Slowly pull both shoulders (scapulae) backward and downward (retraction) until you feel a stretch between your shoulder blades in your upper back. 3. Hold for 10-30 seconds. 4. Relax and repeat. Repeat __________ times. Complete this exercise __________ times a day. Contact a health care provider if:  Your neck pain or discomfort gets much worse when you do an exercise.  Your neck pain or discomfort does not improve within 2 hours after you exercise. If you have any of these problems, stop exercising right away. Do not do the exercises again unless your health care provider says that you can. Get help right away if:  You develop sudden, severe neck pain. If this happens, stop exercising right away. Do not do the exercises again unless your health care provider says that you can. This information is not intended to replace advice given to you by your health care provider. Make sure you discuss any questions you have with your health care provider. Document Revised: 08/04/2018 Document Reviewed: 08/04/2018 Elsevier Patient Education  2020 [employer]  Shoulder Exercises  Ask your health care provider which exercises are safe for you. Do exercises exactly as told by your health care provider and adjust them as directed. It is normal to feel mild stretching, pulling, tightness, or discomfort as you do these exercises. Stop right away if you feel sudden pain or your pain gets worse. Do not begin these exercises until told by your health care provider. Stretching exercises External rotation and abduction This exercise is sometimes called corner stretch. This exercise rotates your arm outward (external rotation) and moves your arm out from your body (abduction). 1. Stand in a doorway with one of your feet slightly in front of the other. This is called a staggered stance. If you cannot reach your forearms to the door frame, stand facing a corner of a room. 2. Choose one of the following positions as told by your health care provider: ? Place your hands and forearms on the door frame above your head. ? Place your hands and forearms on the door frame at the height of your head. ? Place your hands on the door frame at the height of your elbows. 3. Slowly move your weight onto your front foot until you feel a stretch across your chest and in the front of your shoulders. Keep your head and chest upright and keep your abdominal muscles tight. 4. Hold for __________ seconds. 5. To release the stretch, shift your weight to your back foot. Repeat __________ times. Complete this exercise __________ times a day. Extension, standing 1. Stand and hold a broomstick, a cane, or a similar object behind your back. ? Your hands should be a little wider than shoulder width apart. ? Your palms should face away from your back. 2. Keeping your elbows straight and your shoulder muscles relaxed, move the stick away from your body until you feel a stretch in your shoulders (extension). ? Avoid shrugging your shoulders while you move the stick. Keep your shoulder blades tucked down toward the  middle of your back. 3. Hold for __________ seconds. 4. Slowly return to the starting position. Repeat __________ times. Complete this exercise __________ times a day. Range-of-motion exercises Pendulum  1. Stand near a wall or a surface that you can hold onto for balance. 2. Bend at the waist and let your left / right arm hang straight down. Use your other arm to support you. Keep your back straight and do not lock your knees. 3. Relax your left / right arm and shoulder muscles, and move your hips and your trunk so your left / right arm swings freely. Your arm should swing because of the motion of your body, not because you are using your arm or shoulder muscles. 4. Keep moving your hips and trunk so your arm swings in the following directions, as told by your health care provider: ? Side to side. ? Forward and backward. ? In clockwise and counterclockwise circles. 5. Continue each motion for __________ seconds, or for as long as told by your health care provider. 6. Slowly return to the starting position. Repeat __________ times. Complete this exercise __________ times a day. Shoulder flexion, standing  1. Stand and hold a broomstick, a cane, or a similar object. Place your hands a little more than shoulder width apart on the object. Your left / right hand should be palm up, and your other hand should be palm down. 2. Keep your elbow straight and your shoulder muscles relaxed. Push the stick up with your healthy arm to raise your left / right arm in front of your body, and then over your head until you feel a stretch in your shoulder (flexion). ? Avoid shrugging your shoulder while you raise your arm. Keep your shoulder blade tucked down toward the middle of your back. 3. Hold for __________ seconds. 4. Slowly return to the starting position. Repeat __________ times. Complete this exercise __________ times a day. Shoulder abduction, standing 1. Stand and hold a broomstick, a cane, or a  similar object. Place your hands a little more than shoulder width apart on the object. Your left / right hand should be palm up, and your other hand should be palm down. 2. Keep your elbow straight and your shoulder muscles relaxed. Push the object across your body toward your left / right side. Raise your left / right arm to the side of your body (abduction) until you feel a stretch in your shoulder. ? Do not raise your arm above shoulder height unless your health care provider tells you to do that. ? If directed, raise your arm over your head. ? Avoid shrugging your shoulder while you raise your arm. Keep your shoulder blade tucked down toward the middle of your back. 3. Hold for __________ seconds. 4. Slowly return to the starting position. Repeat __________ times. Complete this exercise __________ times a day. Internal rotation  1. Place your left / right hand behind your back, palm up. 2. Use your other hand to dangle an exercise band, a towel, or a similar object over your shoulder. Grasp the band with your left / right hand so you are holding on to both ends. 3. Gently pull up on the band until you feel a stretch in the front of your left / right shoulder. The movement of your arm toward the center of your body is called internal rotation. ? Avoid shrugging your shoulder while you raise your arm. Keep your shoulder blade tucked down toward the middle of your back. 4. Hold for __________ seconds. 5. Release the stretch by letting go of the band and lowering your hands. Repeat __________ times. Complete this exercise __________ times a day. Strengthening exercises External  rotation  1. Sit in a stable chair without armrests. 2. Secure an exercise band to a stable object at elbow height on your left / right side. 3. Place a soft object, such as a folded towel or a small pillow, between your left / right upper arm and your body to move your elbow about 4 inches (10 cm) away from your side.  4. Hold the end of the exercise band so it is tight and there is no slack. 5. Keeping your elbow pressed against the soft object, slowly move your forearm out, away from your abdomen (external rotation). Keep your body steady so only your forearm moves. 6. Hold for __________ seconds. 7. Slowly return to the starting position. Repeat __________ times. Complete this exercise __________ times a day. Shoulder abduction  1. Sit in a stable chair without armrests, or stand up. 2. Hold a __________ weight in your left / right hand, or hold an exercise band with both hands. 3. Start with your arms straight down and your left / right palm facing in, toward your body. 4. Slowly lift your left / right hand out to your side (abduction). Do not lift your hand above shoulder height unless your health care provider tells you that this is safe. ? Keep your arms straight. ? Avoid shrugging your shoulder while you do this movement. Keep your shoulder blade tucked down toward the middle of your back. 5. Hold for __________ seconds. 6. Slowly lower your arm, and return to the starting position. Repeat __________ times. Complete this exercise __________ times a day. Shoulder extension 1. Sit in a stable chair without armrests, or stand up. 2. Secure an exercise band to a stable object in front of you so it is at shoulder height. 3. Hold one end of the exercise band in each hand. Your palms should face each other. 4. Straighten your elbows and lift your hands up to shoulder height. 5. Step back, away from the secured end of the exercise band, until the band is tight and there is no slack. 6. Squeeze your shoulder blades together as you pull your hands down to the sides of your thighs (extension). Stop when your hands are straight down by your sides. Do not let your hands go behind your body. 7. Hold for __________ seconds. 8. Slowly return to the starting position. Repeat __________ times. Complete this  exercise __________ times a day. Shoulder row 1. Sit in a stable chair without armrests, or stand up. 2. Secure an exercise band to a stable object in front of you so it is at waist height. 3. Hold one end of the exercise band in each hand. Position your palms so that your thumbs are facing the ceiling (neutral position). 4. Bend each of your elbows to a 90-degree angle (right angle) and keep your upper arms at your sides. 5. Step back until the band is tight and there is no slack. 6. Slowly pull your elbows back behind you. 7. Hold for __________ seconds. 8. Slowly return to the starting position. Repeat __________ times. Complete this exercise __________ times a day. Shoulder press-ups  1. Sit in a stable chair that has armrests. Sit upright, with your feet flat on the floor. 2. Put your hands on the armrests so your elbows are bent and your fingers are pointing forward. Your hands should be about even with the sides of your body. 3. Push down on the armrests and use your arms to lift yourself off the chair.  Straighten your elbows and lift yourself up as much as you comfortably can. ? Move your shoulder blades down, and avoid letting your shoulders move up toward your ears. ? Keep your feet on the ground. As you get stronger, your feet should support less of your body weight as you lift yourself up. 4. Hold for __________ seconds. 5. Slowly lower yourself back into the chair. Repeat __________ times. Complete this exercise __________ times a day. Wall push-ups  1. Stand so you are facing a stable wall. Your feet should be about one arm-length away from the wall. 2. Lean forward and place your palms on the wall at shoulder height. 3. Keep your feet flat on the floor as you bend your elbows and lean forward toward the wall. 4. Hold for __________ seconds. 5. Straighten your elbows to push yourself back to the starting position. Repeat __________ times. Complete this exercise __________  times a day. This information is not intended to replace advice given to you by your health care provider. Make sure you discuss any questions you have with your health care provider. Document Revised: 01/28/2019 Document Reviewed: 11/05/2018 [employer] Patient Education  2020 [employer]   Back Exercises The following exercises strengthen the muscles that help to support the trunk and back. They also help to keep the lower back flexible. Doing these exercises can help to prevent back pain or lessen existing pain.  If you have back pain or discomfort, try doing these exercises 2-3 times each day or as told by your health care provider.  As your pain improves, do them once each day, but increase the number of times that you repeat the steps for each exercise (do more repetitions).  To prevent the recurrence of back pain, continue to do these exercises once each day or as told by your health care provider. Do exercises exactly as told by your health care provider and adjust them as directed. It is normal to feel mild stretching, pulling, tightness, or discomfort as you do these exercises, but you should stop right away if you feel sudden pain or your pain gets worse. Exercises Single knee to chest Repeat these steps 3-5 times for each leg: 1. Lie on your back on a firm bed or the floor with your legs extended. 2. Bring one knee to your chest. Your other leg should stay extended and in contact with the floor. 3. Hold your knee in place by grabbing your knee or thigh with both hands and hold. 4. Pull on your knee until you feel a gentle stretch in your lower back or buttocks. 5. Hold the stretch for 10-30 seconds. 6. Slowly release and straighten your leg. Pelvic tilt Repeat these steps 5-10 times: 1. Lie on your back on a firm bed or the floor with your legs extended. 2. Bend your knees so they are pointing toward the ceiling and your feet are flat on the floor. 3. Tighten your lower  abdominal muscles to press your lower back against the floor. This motion will tilt your pelvis so your tailbone points up toward the ceiling instead of pointing to your feet or the floor. 4. With gentle tension and even breathing, hold this position for 5-10 seconds. Cat-cow Repeat these steps until your lower back becomes more flexible: 1. Get into a hands-and-knees position on a firm surface. Keep your hands under your shoulders, and keep your knees under your hips. You may place padding under your knees for comfort. 2. Let your head  hang down toward your chest. Contract your abdominal muscles and point your tailbone toward the floor so your lower back becomes rounded like the back of a cat. 3. Hold this position for 5 seconds. 4. Slowly lift your head, let your abdominal muscles relax and point your tailbone up toward the ceiling so your back forms a sagging arch like the back of a cow. 5. Hold this position for 5 seconds.  Press-ups Repeat these steps 5-10 times: 1. Lie on your abdomen (face-down) on the floor. 2. Place your palms near your head, about shoulder-width apart. 3. Keeping your back as relaxed as possible and keeping your hips on the floor, slowly straighten your arms to raise the top half of your body and lift your shoulders. Do not use your back muscles to raise your upper torso. You may adjust the placement of your hands to make yourself more comfortable. 4. Hold this position for 5 seconds while you keep your back relaxed. 5. Slowly return to lying flat on the floor.  Bridges Repeat these steps 10 times: 1. Lie on your back on a firm surface. 2. Bend your knees so they are pointing toward the ceiling and your feet are flat on the floor. Your arms should be flat at your sides, next to your body. 3. Tighten your buttocks muscles and lift your buttocks off the floor until your waist is at almost the same height as your knees. You should feel the muscles working in your  buttocks and the back of your thighs. If you do not feel these muscles, slide your feet 1-2 inches farther away from your buttocks. 4. Hold this position for 3-5 seconds. 5. Slowly lower your hips to the starting position, and allow your buttocks muscles to relax completely. If this exercise is too easy, try doing it with your arms crossed over your chest. Abdominal crunches Repeat these steps 5-10 times: 1. Lie on your back on a firm bed or the floor with your legs extended. 2. Bend your knees so they are pointing toward the ceiling and your feet are flat on the floor. 3. Cross your arms over your chest. 4. Tip your chin slightly toward your chest without bending your neck. 5. Tighten your abdominal muscles and slowly raise your trunk (torso) high enough to lift your shoulder blades a tiny bit off the floor. Avoid raising your torso higher than that because it can put too much stress on your low back and does not help to strengthen your abdominal muscles. 6. Slowly return to your starting position. Back lifts Repeat these steps 5-10 times: 1. Lie on your abdomen (face-down) with your arms at your sides, and rest your forehead on the floor. 2. Tighten the muscles in your legs and your buttocks. 3. Slowly lift your chest off the floor while you keep your hips pressed to the floor. Keep the back of your head in line with the curve in your back. Your eyes should be looking at the floor. 4. Hold this position for 3-5 seconds. 5. Slowly return to your starting position. Contact a health care provider if:  Your back pain or discomfort gets much worse when you do an exercise.  Your worsening back pain or discomfort does not lessen within 2 hours after you exercise. If you have any of these problems, stop doing these exercises right away. Do not do them again unless your health care provider says that you can. Get help right away if:  You develop sudden,  severe back pain. If this happens, stop  doing the exercises right away. Do not do them again unless your health care provider says that you can. This information is not intended to replace advice given to you by your health care provider. Make sure you discuss any questions you have with your health care provider. Document Revised: 02/10/2019 Document Reviewed: 07/08/2018 Elsevier Patient Education  2020 [employer].

## 2020-01-10 NOTE — Progress Notes (Addendum)
Chief Complaint  Patient presents with  . Hand Problem    Pain and stiffness in the left hand. 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 Pt states can baerley move the fingers on their left hand and there is no strength to their grip.   Marland Kitchen Shoulder Problem    Feels as though there may be a pinched nerve in the left shoulder. Pain 2/10 today. Feels this radiating down into left hand.    F/u  1. Left hand weakness rad up to left shoulder and at times pain he feels he pulled a muscle at work after lifting continuously 50 lbs lights. He cant even pinch his fingers closed together but he feels like it is getting better since Friday 01/06/20 and missed work today 01/10/20 and never misses work tried ibuprofen 203 x per day pain 2/10 today. Moving shoulder joint backwards helps  2. Left lower leg wound x 1 year seen wound clinic not complaining today incidentally seen having yellow drainage  3. HTN not taking lis 10 mg qd x > 1 year due to GI upset and hctz could not tolerate as well   Review of Systems  Constitutional: Negative for weight loss.  HENT: Negative for hearing loss.   Eyes: Negative for blurred vision.  Respiratory: Negative for shortness of breath.   Cardiovascular: Negative for chest pain.  Gastrointestinal: Negative for abdominal pain.  Musculoskeletal: Positive for joint pain.  Skin:       LLE wound    Past Medical History:  Diagnosis Date  . Asthma   . Chickenpox    No past surgical history on file. Family History  Problem Relation Age of Onset  . Breast cancer Mother    Social History   Socioeconomic History  . Marital status: Single    Spouse name: Not on file  . Number of children: Not on file  . Years of education: Not on file  . Highest education level: Not on file  Occupational History  . Not on file  Tobacco Use  . Smoking status: Former Smoker    Quit date: 02/08/2007    Years since quitting: 12.9  . Smokeless tobacco: Never  Used  Substance and Sexual Activity  . Alcohol use: No    Alcohol/week: 0.0 standard drinks  . Drug use: No  . Sexual activity: Not on file  Other Topics Concern  . Not on file  Social History Narrative  . Not on file   Social Determinants of Health   Financial Resource Strain:   . Difficulty of Paying Living Expenses:   Food Insecurity:   . Worried About Charity fundraiser in the Last Year:   . Arboriculturist in the Last Year:   Transportation Needs:   . Film/video editor (Medical):   Marland Kitchen Lack of Transportation (Non-Medical):   Physical Activity:   . Days of Exercise per Week:   . Minutes of Exercise per Session:   Stress:   . Feeling of Stress :   Social Connections:   . Frequency of Communication with Friends and Family:   . Frequency of Social Gatherings with Friends and Family:   . Attends Religious Services:   . Active Member of Clubs or Organizations:   . Attends Archivist Meetings:   Marland Kitchen Marital Status:   Intimate Partner Violence:   . Fear of Current or Ex-Partner:   . Emotionally Abused:   Marland Kitchen Physically Abused:   .  Sexually Abused:    Current Meds  Medication Sig  . IBUPROFEN PO Take by mouth in the morning, at noon, and at bedtime.  . [DISCONTINUED] lisinopril (PRINIVIL,ZESTRIL) 10 MG tablet TAKE 1 TABLET BY MOUTH EVERY DAY   Allergies  Allergen Reactions  . Novocain [Procaine] Other (See Comments)    Panic attack  . Cortisone Rash  . Hctz [Hydrochlorothiazide] Rash   No results found for this or any previous visit (from the past 2160 hour(s)). Objective  Body mass index is 39.82 kg/m. Wt Readings from Last 3 Encounters:  01/10/20 214 lb 3.2 oz (97.2 kg)  12/10/18 220 lb 3.2 oz (99.9 kg)  11/22/18 216 lb (98 kg)   Temp Readings from Last 3 Encounters:  01/10/20 (!) 97.1 F (36.2 C) (Temporal)  12/10/18 (!) 97.5 F (36.4 C) (Oral)  11/22/18 97.8 F (36.6 C) (Oral)   BP Readings from Last 3 Encounters:  01/10/20 (!) 146/90    12/16/18 130/70  12/10/18 (!) 150/100   Pulse Readings from Last 3 Encounters:  01/10/20 64  12/16/18 83  12/10/18 76    Physical Exam Vitals and nursing note reviewed.  Constitutional:      Appearance: Normal appearance. He is well-developed and well-groomed. He is obese.  HENT:     Head: Normocephalic and atraumatic.  Eyes:     Conjunctiva/sclera: Conjunctivae normal.     Pupils: Pupils are equal, round, and reactive to light.  Cardiovascular:     Rate and Rhythm: Normal rate and regular rhythm.     Heart sounds: Normal heart sounds. No murmur.  Pulmonary:     Effort: Pulmonary effort is normal.     Breath sounds: Normal breath sounds.  Musculoskeletal:     Right lower leg: 2+ Edema present.     Left lower leg: 2+ Edema present.  Skin:    General: Skin is warm and dry.       Neurological:     General: No focal deficit present.     Mental Status: He is alert and oriented to person, place, and time. Mental status is at baseline.     Gait: Gait normal.     Comments: Bl walks with cane   Psychiatric:        Attention and Perception: Attention and perception normal.        Mood and Affect: Mood and affect normal.        Behavior: Behavior normal. Behavior is cooperative.        Thought Content: Thought content normal.        Cognition and Memory: Cognition and memory normal.        Judgment: Judgment normal.     Assessment  Plan  Acute pain of left shoulder with reduced motor function/weakness left upper arm ? Rotator cuff etiology vs neck pathology ddx also consider stroke but will hold on brain imaging for now and w/u [facility] - Plan: DG Shoulder Left, cyclobenzaprine (FLEXERIL) 5-10 MG tablet Referred to [facility] ortho Dr. Kirtland Bouchard urgently  Consider CT brain in future due to risk factors HTN and not taking BP meds x 1 year lis 10 mg qd  Consider MRI neck and left shoulder in the future if Xrays indeterminate   Cervicalgia - Plan: DG Cervical Spine Complete, cyclobenzaprine  (FLEXERIL) 5 MG tablet  Muscle spasm - Plan: cyclobenzaprine (FLEXERIL) 5 MG tablet  Essential hypertension - Plan: amLODipine (NORVASC) 5 MG tablet, Comprehensive metabolic panel, CBC with Differential/Platelet, TSH, Lipid panel Was not  able to tolerate lis 10 mg qd and has not taken x 1 year   Low back pain without sciatica, unspecified back pain laterality, unspecified chronicity - Plan: DG Lumbar Spine Complete  Vitamin D deficiency - Plan: Vitamin D (25 hydroxy)  Open wound - Plan: mupirocin ointment (BACTROBAN) 2 %, Ambulatory referral to Vascular Surgery needs unna boots  Saw wound  Clinic > 1 year ago and wound still not healing   HM Flu shot utd Consider Tdap, shingrix   Checked PSA, hep C  Consider colonoscopy in future disc with PCP   Provider: Dr. French Ana McLean-Scocuzza-[facility]

## 2020-01-11 ENCOUNTER — Other Ambulatory Visit: Payer: Self-pay | Admitting: Internal Medicine

## 2020-01-11 DIAGNOSIS — E785 Hyperlipidemia, unspecified: Secondary | ICD-10-CM

## 2020-01-11 DIAGNOSIS — E559 Vitamin D deficiency, unspecified: Secondary | ICD-10-CM | POA: Insufficient documentation

## 2020-01-11 DIAGNOSIS — I872 Venous insufficiency (chronic) (peripheral): Secondary | ICD-10-CM | POA: Insufficient documentation

## 2020-01-11 DIAGNOSIS — L97309 Non-pressure chronic ulcer of unspecified ankle with unspecified severity: Secondary | ICD-10-CM | POA: Insufficient documentation

## 2020-01-11 DIAGNOSIS — E611 Iron deficiency: Secondary | ICD-10-CM | POA: Insufficient documentation

## 2020-01-11 DIAGNOSIS — I83003 Varicose veins of unspecified lower extremity with ulcer of ankle: Secondary | ICD-10-CM | POA: Insufficient documentation

## 2020-01-11 LAB — COMPREHENSIVE METABOLIC PANEL
ALT: 11 U/L (ref 0–53)
AST: 17 U/L (ref 0–37)
Albumin: 4.1 g/dL (ref 3.5–5.2)
Alkaline Phosphatase: 72 U/L (ref 39–117)
BUN: 25 mg/dL — ABNORMAL HIGH (ref 6–23)
CO2: 28 mEq/L (ref 19–32)
Calcium: 9.3 mg/dL (ref 8.4–10.5)
Chloride: 101 mEq/L (ref 96–112)
Creatinine, Ser: 1.26 mg/dL (ref 0.40–1.50)
GFR: 59.2 mL/min — ABNORMAL LOW (ref >60.00)
Glucose, Bld: 91 mg/dL (ref 70–99)
Potassium: 4.3 mEq/L (ref 3.5–5.1)
Sodium: 137 mEq/L (ref 135–145)
Total Bilirubin: 0.6 mg/dL (ref 0.2–1.2)
Total Protein: 7.6 g/dL (ref 6.0–8.3)

## 2020-01-11 LAB — CBC WITH DIFFERENTIAL/PLATELET
Basophils Absolute: 0.1 10*3/uL (ref 0.0–0.1)
Basophils Relative: 1.9 % (ref 0.0–3.0)
Eosinophils Absolute: 0.4 10*3/uL (ref 0.0–0.7)
Eosinophils Relative: 5.5 % — ABNORMAL HIGH (ref 0.0–5.0)
HCT: 34.3 % — ABNORMAL LOW (ref 39.0–52.0)
Hemoglobin: 11.3 g/dL — ABNORMAL LOW (ref 13.0–17.0)
Lymphocytes Relative: 21.1 % (ref 12.0–46.0)
Lymphs Abs: 1.4 10*3/uL (ref 0.7–4.0)
MCHC: 32.9 g/dL (ref 30.0–36.0)
MCV: 86.3 fl (ref 78.0–100.0)
Monocytes Absolute: 0.6 10*3/uL (ref 0.1–1.0)
Monocytes Relative: 8.7 % (ref 3.0–12.0)
Neutro Abs: 4.3 10*3/uL (ref 1.4–7.7)
Neutrophils Relative %: 62.8 % (ref 43.0–77.0)
Platelets: 287 10*3/uL (ref 150.0–400.0)
RBC: 3.97 Mil/uL — ABNORMAL LOW (ref 4.22–5.81)
RDW: 13.5 % (ref 11.5–15.5)
WBC: 6.9 10*3/uL (ref 4.0–10.5)

## 2020-01-11 LAB — VITAMIN D 25 HYDROXY (VIT D DEFICIENCY, FRACTURES): VITD: <7 ng/mL — ABNORMAL LOW (ref 30.00–100.00)

## 2020-01-11 LAB — PSA: PSA: 0 ng/mL — ABNORMAL LOW (ref 0.10–4.00)

## 2020-01-11 LAB — HEMOGLOBIN A1C: Hgb A1c MFr Bld: 5.6 % (ref 4.6–6.5)

## 2020-01-11 LAB — IRON,TIBC AND FERRITIN PANEL
%SAT: 13 % (calc) — ABNORMAL LOW (ref 20–48)
Ferritin: 64 ng/mL (ref 38–380)
Iron: 44 ug/dL — ABNORMAL LOW (ref 50–180)
TIBC: 330 mcg/dL (calc) (ref 250–425)

## 2020-01-11 LAB — TSH: TSH: 1.87 u[IU]/mL (ref 0.35–4.50)

## 2020-01-11 LAB — LIPID PANEL
Cholesterol: 178 mg/dL (ref 0–200)
HDL: 50.1 mg/dL (ref >39.00)
LDL Cholesterol: 113 mg/dL — ABNORMAL HIGH (ref 0–99)
NonHDL: 128.25
Total CHOL/HDL Ratio: 4
Triglycerides: 74 mg/dL (ref 0.0–149.0)
VLDL: 14.8 mg/dL (ref 0.0–40.0)

## 2020-01-11 MED ORDER — CHOLECALCIFEROL 1.25 MG (50000 UT) PO CAPS: 50000.0000 [IU] | ORAL_CAPSULE | ORAL | 1 refills | Status: DC

## 2020-01-11 NOTE — Progress Notes (Signed)
MRN : [id number]  Johnathan Scott. is a 54 y.o. (Aug 13, 1966) male who presents with chief complaint of No chief complaint on file. Marland Kitchen  History of Present Illness:  Patient is seen for evaluation of leg pain and swelling associated with new onset ulceration. The patient first noticed the swelling remotely. The swelling is associated with pain and discoloration. The pain and swelling worsens with prolonged dependency and improves with elevation. The pain is unrelated to activity.  The patient notes that in the morning the legs are better but the leg symptoms worsened throughout the course of the day. The patient has also noted a progressive worsening of the discoloration in the ankle and shin area.   The patient notes that an ulcer has developed acutely without specific trauma and since it occurred it has been very slow to heal.  There is a moderate amount of drainage associated with the open area.  The wound is also very painful.  The patient denies claudication symptoms or rest pain symptoms.  The patient denies DJD and LS spine disease.  The patient has not had any past angiography, interventions or vascular surgery.  Elevation makes the leg symptoms better, dependency makes them much worse. The patient denies any recent changes in medications.  The patient has not been wearing graduated compression.  The patient denies a history of DVT or PE. There is no prior history of phlebitis. There is no history of primary lymphedema.  No history of malignancies. No history of trauma or groin or pelvic surgery. There is no history of radiation treatment to the groin or pelvis       No outpatient medications have been marked as taking for the 01/12/20 encounter (Appointment) with Delana Meyer, Dolores Lory, MD.    Past Medical History:  Diagnosis Date  . Asthma   . Chickenpox     No past surgical history on file.  Social History Social History   Tobacco Use  . Smoking status: Former  Smoker    Quit date: 02/08/2007    Years since quitting: 12.9  . Smokeless tobacco: Never Used  Substance Use Topics  . Alcohol use: No    Alcohol/week: 0.0 standard drinks  . Drug use: No    Family History Family History  Problem Relation Age of Onset  . Breast cancer Mother   No family history of bleeding/clotting disorders, porphyria or autoimmune disease   Allergies  Allergen Reactions  . Novocain [Procaine] Other (See Comments)    Panic attack  . Cortisone Rash  . Hctz [Hydrochlorothiazide] Rash     REVIEW OF SYSTEMS (Negative unless checked)  Constitutional: [] Weight loss  [] Fever  [] Chills Cardiac: [] Chest pain   [] Chest pressure   [] Palpitations   [] Shortness of breath when laying flat   [] Shortness of breath with exertion. Vascular:  [] Pain in legs with walking   [x] Pain in legs at rest  [] History of DVT   [] Phlebitis   [] Swelling in legs   [] Varicose veins   [x] Non-healing ulcers Pulmonary:   [] Uses home oxygen   [] Productive cough   [] Hemoptysis   [] Wheeze  [] COPD   [] Asthma Neurologic:  [] Dizziness   [] Seizures   [] History of stroke   [] History of TIA  [] Aphasia   [] Vissual changes   [] Weakness or numbness in arm   [] Weakness or numbness in leg Musculoskeletal:   [] Joint swelling   [] Joint pain   [] Low back pain Hematologic:  [] Easy bruising  [] Easy bleeding   [] Hypercoagulable state   []   Anemic Gastrointestinal:  [] Diarrhea   [] Vomiting  [] Gastroesophageal reflux/heartburn   [] Difficulty swallowing. Genitourinary:  [] Chronic kidney disease   [] Difficult urination  [] Frequent urination   [] Blood in urine Skin:  [x] Rashes   [x] Ulcers  Psychological:  [] History of anxiety   []  History of major depression.  Physical Examination  There were no vitals filed for this visit. There is no height or weight on file to calculate BMI. Gen: WD/WN, NAD Head: [place]/AT, No temporalis wasting.  Ear/Nose/Throat: Hearing grossly intact, nares w/o erythema or drainage, poor  dentition Eyes: PER, EOMI, sclera nonicteric.  Neck: Supple, no masses.  No bruit or JVD.  Pulmonary:  Good air movement, clear to auscultation bilaterally, no use of accessory muscles.  Cardiac: RRR, normal S1, S2, no Murmurs. Vascular: 2-3+ edema of the left leg with severe venous changes of the left leg.  Venous ulcer noted in the ankle area on the left, noninfected Vessel Right Left  Radial Palpable Palpable  PT Palpable Palpable  DP Not Palpable Not Palpable  Gastrointestinal: soft, non-distended. No guarding/no peritoneal signs.  Musculoskeletal: M/S 5/5 throughout.  No deformity or atrophy.  Neurologic: CN 2-12 intact. Pain and light touch intact in extremities.  Symmetrical.  Speech is fluent. Motor exam as listed above. Psychiatric: Judgment intact, Mood & affect appropriate for pt's clinical situation. Dermatologic: Venous stasis dermatitis with ulcers present on the left.  No changes consistent with cellulitis.   CBC Lab Results  Component Value Date   WBC 6.3 12/16/2018   HGB 11.4 (L) 12/16/2018   HCT 35.3 (L) 12/16/2018   MCV 88.1 12/16/2018   PLT 215.0 12/16/2018    BMET    Component Value Date/Time   NA 137 12/27/2018 1546   K 4.7 12/27/2018 1546   CL 103 12/27/2018 1546   CO2 24 12/27/2018 1546   GLUCOSE 106 (H) 12/27/2018 1546   BUN 32 (H) 12/27/2018 1546   CREATININE 1.37 12/27/2018 1546   CALCIUM 9.8 12/27/2018 1546   GFRNONAA >60 07/23/2018 1801   GFRAA >60 07/23/2018 1801   CrCl cannot be calculated (Patient's most recent lab result is older than the maximum 21 days allowed.).  COAG No results found for: INR, PROTIME  Radiology DG Cervical Spine Complete  Result Date: 01/10/2020 CLINICAL DATA:  Cervicalgia EXAM: CERVICAL SPINE - COMPLETE 4+ VIEW COMPARISON:  None. FINDINGS: Frontal, lateral, open-mouth odontoid, and bilateral oblique views were obtained. There is no evident fracture or spondylolisthesis. Prevertebral soft tissues and predental  space regions are normal. There is partial ankylosis at C2-3, an anatomic variant. There is moderately severe disc space narrowing at C6-7. There is mild disc space narrowing at C7-T1. There are anterior osteophytes at C6 and C7. There is facet hypertrophy with exit foraminal narrowing at C4-5, C5-6, and C6-7 bilaterally. Lung apices are clear. IMPRESSION: Osteoarthritic change at several levels. No fracture or spondylolisthesis. Electronically Signed   By: 12/18/2018 III M.D.   On: 01/10/2020 17:19   DG Lumbar Spine Complete  Result Date: 01/10/2020 CLINICAL DATA:  Low back pain EXAM: LUMBAR SPINE - COMPLETE 4+ VIEW COMPARISON:  None. FINDINGS: Frontal, lateral, spot lumbosacral lateral, and bilateral oblique views were obtained. There are 5 non-rib-bearing lumbar type vertebral bodies. There is no fracture or spondylolisthesis. Disc spaces appear unremarkable. There is no appreciable facet arthropathy. IMPRESSION: No fracture or spondylolisthesis.  No appreciable arthropathy. Electronically Signed   By: 02/26/2019 III M.D.   On: 01/10/2020 17:21   DG Shoulder Left  Result Date: 01/10/2020 CLINICAL DATA:  Pain EXAM: LEFT SHOULDER - 2+ VIEW COMPARISON:  None. FINDINGS: Internal rotation, external rotation, and Y scapular images were obtained. No evident fracture or dislocation. Joint spaces appear normal. No erosive change or intra-articular calcification. Visualized lungs clear. IMPRESSION: No fracture or dislocation.  No evident arthropathy. Electronically Signed   By: Bretta Bang III M.D.   On: 01/10/2020 17:18     Assessment/Plan 1. Venous stasis ulcer of ankle with fat layer exposed with varicose veins, unspecified laterality (HCC) No surgery or intervention at this point in time.    I have had a long discussion with the patient regarding venous insufficiency and why it  causes symptoms, specifically venous ulceration . I have discussed with the patient the chronic skin  changes that accompany venous insufficiency and the long term sequela such as infection and recurring  ulceration.  Patient will be placed in [employer] which will be changed weekly drainage permitting.  In addition, behavioral modification including several periods of elevation of the lower extremities during the day will be continued. Achieving a position with the ankles at heart level was stressed to the patient  The patient is instructed to begin routine exercise, especially walking on a daily basis  Following the review of the ultrasound the patient will follow up in one week to reassess the degree of swelling and the control that Unna therapy is offering.   The patient can be assessed for graduated compression stockings or wraps as well as a Lymph Pump once the ulcers are healed.   2. Chronic venous insufficiency No surgery or intervention at this point in time.    I have had a long discussion with the patient regarding venous insufficiency and why it  causes symptoms. I have discussed with the patient the chronic skin changes that accompany venous insufficiency and the long term sequela such as infection and ulceration.  Patient will begin wearing graduated compression stockings class 1 (20-30 mmHg) or compression wraps on a daily basis a prescription was given. The patient will put the stockings on first thing in the morning and removing them in the evening. The patient is instructed specifically not to sleep in the stockings.    In addition, behavioral modification including several periods of elevation of the lower extremities during the day will be continued. I have demonstrated that proper elevation is a position with the ankles at heart level.  The patient is instructed to begin routine exercise, especially walking on a daily basis  Following the review of the ultrasound the patient will follow up in 2-3 months to reassess the degree of swelling and the control that graduated  compression stockings or compression wraps  is offering.   The patient can be assessed for a Lymph Pump at that time  3. Essential hypertension Continue antihypertensive medications as already ordered, these medications have been reviewed and there are no changes at this time.   4. Lymphedema See #1&2    Levora Dredge, MD  01/11/2020 11:26 AM

## 2020-01-12 ENCOUNTER — Encounter (INDEPENDENT_AMBULATORY_CARE_PROVIDER_SITE_OTHER): Payer: Self-pay | Admitting: Vascular Surgery

## 2020-01-12 ENCOUNTER — Ambulatory Visit (INDEPENDENT_AMBULATORY_CARE_PROVIDER_SITE_OTHER): Payer: 59 | Admitting: Vascular Surgery

## 2020-01-12 ENCOUNTER — Other Ambulatory Visit: Payer: Self-pay

## 2020-01-12 ENCOUNTER — Ambulatory Visit (INDEPENDENT_AMBULATORY_CARE_PROVIDER_SITE_OTHER): Payer: 59

## 2020-01-12 ENCOUNTER — Other Ambulatory Visit: Payer: 59

## 2020-01-12 ENCOUNTER — Other Ambulatory Visit (INDEPENDENT_AMBULATORY_CARE_PROVIDER_SITE_OTHER): Payer: Self-pay | Admitting: Internal Medicine

## 2020-01-12 VITALS — BP 201/97 | HR 65 | Ht 61.0 in | Wt 213.0 lb

## 2020-01-12 DIAGNOSIS — Z13818 Encounter for screening for other digestive system disorders: Secondary | ICD-10-CM

## 2020-01-12 DIAGNOSIS — I890 Lymphedema, not elsewhere classified: Secondary | ICD-10-CM

## 2020-01-12 DIAGNOSIS — I83003 Varicose veins of unspecified lower extremity with ulcer of ankle: Secondary | ICD-10-CM | POA: Diagnosis not present

## 2020-01-12 DIAGNOSIS — I872 Venous insufficiency (chronic) (peripheral): Secondary | ICD-10-CM

## 2020-01-12 DIAGNOSIS — L89899 Pressure ulcer of other site, unspecified stage: Secondary | ICD-10-CM

## 2020-01-12 DIAGNOSIS — L97302 Non-pressure chronic ulcer of unspecified ankle with fat layer exposed: Secondary | ICD-10-CM

## 2020-01-12 DIAGNOSIS — I10 Essential (primary) hypertension: Secondary | ICD-10-CM

## 2020-01-12 DIAGNOSIS — Z1159 Encounter for screening for other viral diseases: Secondary | ICD-10-CM

## 2020-01-12 NOTE — Addendum Note (Signed)
Addended by: Bonnell Public I on: 01/12/2020 03:34 PM   Modules accepted: Orders

## 2020-01-13 LAB — URINALYSIS, ROUTINE W REFLEX MICROSCOPIC
Bacteria, UA: NONE SEEN /HPF
Bilirubin Urine: NEGATIVE
Glucose, UA: NEGATIVE
Hgb urine dipstick: NEGATIVE
Hyaline Cast: NONE SEEN /LPF
Ketones, ur: NEGATIVE
Leukocytes,Ua: NEGATIVE
Nitrite: POSITIVE — AB
Protein, ur: NEGATIVE
RBC / HPF: NONE SEEN /HPF (ref 0–2)
Specific Gravity, Urine: 1.017 (ref 1.001–1.03)
Squamous Epithelial / HPF: NONE SEEN /HPF (ref <=5)
WBC, UA: NONE SEEN /HPF (ref 0–5)
pH: 6.5 (ref 5.0–8.0)

## 2020-01-13 LAB — HEPATITIS C ANTIBODY
Hepatitis C Ab: NONREACTIVE
SIGNAL TO CUT-OFF: 0.1 (ref <1.00)

## 2020-01-16 NOTE — Addendum Note (Signed)
Addended by: Quentin Ore on: 01/16/2020 10:06 AM   Modules accepted: Orders

## 2020-01-19 ENCOUNTER — Ambulatory Visit (INDEPENDENT_AMBULATORY_CARE_PROVIDER_SITE_OTHER): Payer: 59 | Admitting: Nurse Practitioner

## 2020-01-19 ENCOUNTER — Other Ambulatory Visit: Payer: Self-pay

## 2020-01-19 DIAGNOSIS — L97302 Non-pressure chronic ulcer of unspecified ankle with fat layer exposed: Secondary | ICD-10-CM | POA: Diagnosis not present

## 2020-01-19 DIAGNOSIS — I83003 Varicose veins of unspecified lower extremity with ulcer of ankle: Secondary | ICD-10-CM

## 2020-01-19 NOTE — Progress Notes (Signed)
History of Present Illness  There is no documented history at this time  Assessments & Plan   There are no diagnoses linked to this encounter.    Additional instructions  Subjective:  Patient presents with venous ulcer of the Left lower extremity.    Procedure:  3 layer unna wrap was placed Left lower extremity.   Plan:   Follow up in one week.  

## 2020-01-23 ENCOUNTER — Encounter (INDEPENDENT_AMBULATORY_CARE_PROVIDER_SITE_OTHER): Payer: Self-pay | Admitting: Nurse Practitioner

## 2020-01-26 ENCOUNTER — Ambulatory Visit (INDEPENDENT_AMBULATORY_CARE_PROVIDER_SITE_OTHER): Payer: 59 | Admitting: Nurse Practitioner

## 2020-01-26 ENCOUNTER — Other Ambulatory Visit: Payer: Self-pay

## 2020-01-26 ENCOUNTER — Encounter (INDEPENDENT_AMBULATORY_CARE_PROVIDER_SITE_OTHER): Payer: Self-pay | Admitting: Nurse Practitioner

## 2020-01-26 VITALS — BP 222/94 | HR 84 | Ht 61.0 in

## 2020-01-26 DIAGNOSIS — I83003 Varicose veins of unspecified lower extremity with ulcer of ankle: Secondary | ICD-10-CM | POA: Diagnosis not present

## 2020-01-26 DIAGNOSIS — L97302 Non-pressure chronic ulcer of unspecified ankle with fat layer exposed: Secondary | ICD-10-CM | POA: Diagnosis not present

## 2020-01-26 NOTE — Progress Notes (Signed)
History of Present Illness  There is no documented history at this time  Assessments & Plan   There are no diagnoses linked to this encounter.    Additional instructions  Subjective:  Patient presents with venous ulcer of the Left lower extremity.    Procedure:  3 layer unna wrap was placed Left lower extremity.   Plan:   Follow up in one week.  

## 2020-02-01 ENCOUNTER — Ambulatory Visit
Admission: RE | Admit: 2020-02-01 | Discharge: 2020-02-01 | Disposition: A | Discharge status: Home / Self Care | Payer: 59 | Source: Ambulatory Visit | Attending: Internal Medicine | Admitting: Internal Medicine

## 2020-02-01 ENCOUNTER — Other Ambulatory Visit: Payer: Self-pay

## 2020-02-01 DIAGNOSIS — R29898 Other symptoms and signs involving the musculoskeletal system: Secondary | ICD-10-CM | POA: Diagnosis present

## 2020-02-01 DIAGNOSIS — M542 Cervicalgia: Secondary | ICD-10-CM | POA: Insufficient documentation

## 2020-02-01 DIAGNOSIS — M7542 Impingement syndrome of left shoulder: Secondary | ICD-10-CM

## 2020-02-01 DIAGNOSIS — M25512 Pain in left shoulder: Secondary | ICD-10-CM | POA: Diagnosis present

## 2020-02-02 ENCOUNTER — Ambulatory Visit (INDEPENDENT_AMBULATORY_CARE_PROVIDER_SITE_OTHER): Payer: 59 | Admitting: Nurse Practitioner

## 2020-02-02 ENCOUNTER — Encounter (INDEPENDENT_AMBULATORY_CARE_PROVIDER_SITE_OTHER): Payer: Self-pay

## 2020-02-02 VITALS — BP 157/86 | HR 84 | Resp 16 | Ht 68.0 in | Wt 215.0 lb

## 2020-02-02 DIAGNOSIS — L97302 Non-pressure chronic ulcer of unspecified ankle with fat layer exposed: Secondary | ICD-10-CM

## 2020-02-02 DIAGNOSIS — I83003 Varicose veins of unspecified lower extremity with ulcer of ankle: Secondary | ICD-10-CM

## 2020-02-02 NOTE — Progress Notes (Signed)
History of Present Illness  There is no documented history at this time  Assessments & Plan   There are no diagnoses linked to this encounter.    Additional instructions  Subjective:  Patient presents with venous ulcer of the Left lower extremity.    Procedure:  3 layer unna wrap was placed Left lower extremity.   Plan:   Follow up in one week.  

## 2020-02-03 ENCOUNTER — Other Ambulatory Visit: Payer: Self-pay | Admitting: Internal Medicine

## 2020-02-09 ENCOUNTER — Ambulatory Visit (INDEPENDENT_AMBULATORY_CARE_PROVIDER_SITE_OTHER): Payer: 59 | Admitting: Vascular Surgery

## 2020-02-09 ENCOUNTER — Other Ambulatory Visit: Payer: Self-pay

## 2020-02-09 VITALS — BP 175/78 | HR 93 | Ht 61.0 in | Wt 217.0 lb

## 2020-02-09 DIAGNOSIS — E785 Hyperlipidemia, unspecified: Secondary | ICD-10-CM

## 2020-02-09 DIAGNOSIS — L97302 Non-pressure chronic ulcer of unspecified ankle with fat layer exposed: Secondary | ICD-10-CM

## 2020-02-09 DIAGNOSIS — I10 Essential (primary) hypertension: Secondary | ICD-10-CM

## 2020-02-09 DIAGNOSIS — I83003 Varicose veins of unspecified lower extremity with ulcer of ankle: Secondary | ICD-10-CM

## 2020-02-09 DIAGNOSIS — I872 Venous insufficiency (chronic) (peripheral): Secondary | ICD-10-CM | POA: Diagnosis not present

## 2020-02-11 ENCOUNTER — Encounter (INDEPENDENT_AMBULATORY_CARE_PROVIDER_SITE_OTHER): Payer: Self-pay | Admitting: Vascular Surgery

## 2020-02-11 NOTE — Progress Notes (Signed)
MRN : [id number]  Johnathan Scott. is a 54 y.o. (03/04/1966) male who presents with chief complaint of  Chief Complaint  Patient presents with  . Follow-up    Unna boot check  .  History of Present Illness:  Patient is seen for follow up evaluation of leg pain and swelling associated with venous ulceration. The patient was recently seen here and started on Unna boot therapy.  The swelling abruptly became much worse bilaterally and is associated with pain and discoloration. The pain and swelling worsens with prolonged dependency and improves with elevation.  The patient notes that in the morning the legs are better but the leg symptoms worsened throughout the course of the day. The patient has also noted a progressive worsening of the discoloration in the ankle and shin area.   The patient notes that an ulcer has developed acutely without specific trauma and since it occurred it has been very slow to heal.  There is a moderate amount of drainage associated with the open area.  The wound is also very painful.  The patient notes that they were not able to tolerate the Unna boot and removed it several days ago.  The patient states that they have been elevating as much as possible. The patient denies any recent changes in medications.  The patient denies a history of DVT or PE. There is no prior history of phlebitis. There is no history of primary lymphedema.  No SOB or increased cough.  No sputum production.  No recent episodes of CHF exacerbation.    No outpatient medications have been marked as taking for the 02/09/20 encounter (Office Visit) with Gilda Crease, Latina Craver, MD.    Past Medical History:  Diagnosis Date  . Asthma   . Chickenpox     No past surgical history on file.  Social History Social History   Tobacco Use  . Smoking status: Former Smoker    Quit date: 02/08/2007    Years since quitting: 13.0  . Smokeless tobacco: Never Used  Substance Use Topics  . Alcohol  use: No    Alcohol/week: 0.0 standard drinks  . Drug use: No    Family History Family History  Problem Relation Age of Onset  . Breast cancer Mother     Allergies  Allergen Reactions  . Novocain [Procaine] Other (See Comments)    Panic attack  . Cortisone Rash  . Hctz [Hydrochlorothiazide] Rash     REVIEW OF SYSTEMS (Negative unless checked)  Constitutional: [] Weight loss  [] Fever  [] Chills Cardiac: [] Chest pain   [] Chest pressure   [] Palpitations   [] Shortness of breath when laying flat   [] Shortness of breath with exertion. Vascular:  [] Pain in legs with walking   [] Pain in legs at rest  [] History of DVT   [] Phlebitis   [] Swelling in legs   [] Varicose veins   [] Non-healing ulcers Pulmonary:   [] Uses home oxygen   [] Productive cough   [] Hemoptysis   [] Wheeze  [] COPD   [] Asthma Neurologic:  [] Dizziness   [] Seizures   [] History of stroke   [] History of TIA  [] Aphasia   [] Vissual changes   [] Weakness or numbness in arm   [] Weakness or numbness in leg Musculoskeletal:   [] Joint swelling   [] Joint pain   [] Low back pain Hematologic:  [] Easy bruising  [] Easy bleeding   [] Hypercoagulable state   [] Anemic Gastrointestinal:  [] Diarrhea   [] Vomiting  [] Gastroesophageal reflux/heartburn   [] Difficulty swallowing. Genitourinary:  [] Chronic kidney disease   []   Difficult urination  [] Frequent urination   [] Blood in urine Skin:  [x] Rashes   [x] Ulcers  Psychological:  [] History of anxiety   []  History of major depression.  Physical Examination  Vitals:   02/09/20 1415  BP: (!) 175/78  Pulse: 93  Weight: 217 lb (98.4 kg)  Height: 5\' 1"  (1.549 m)   Body mass index is 41 kg/m. Gen: WD/WN, NAD Head: [place]/AT, No temporalis wasting.  Ear/Nose/Throat: Hearing grossly intact, nares w/o erythema or drainage Eyes: PER, EOMI, sclera nonicteric.  Neck: Supple, no large masses.   Pulmonary:  Good air movement, no audible wheezing bilaterally, no use of accessory muscles.  Cardiac: RRR, no  JVD Vascular: 2-3+ edema of the left leg with severe venous changes of the left leg.  Venous ulcer noted in the ankle area on the left, noninfected Vessel Right Left  PT Palpable Palpable  DP Palpable Palpable  Gastrointestinal: Non-distended. No guarding/no peritoneal signs.  Musculoskeletal: M/S 5/5 throughout.  No deformity or atrophy.  Neurologic: CN 2-12 intact. Symmetrical.  Speech is fluent. Motor exam as listed above. Psychiatric: Judgment intact, Mood & affect appropriate for pt's clinical situation. Dermatologic: Venous stasis dermatitis with ulcers present on the left.  No changes consistent with cellulitis. Lymph : No lichenification or skin changes of chronic lymphedema.  CBC Lab Results  Component Value Date   WBC 6.9 01/10/2020   HGB 11.3 (L) 01/10/2020   HCT 34.3 (L) 01/10/2020   MCV 86.3 01/10/2020   PLT 287.0 01/10/2020    BMET    Component Value Date/Time   NA 137 01/10/2020 1632   K 4.3 01/10/2020 1632   CL 101 01/10/2020 1632   CO2 28 01/10/2020 1632   GLUCOSE 91 01/10/2020 1632   BUN 25 (H) 01/10/2020 1632   CREATININE 1.26 01/10/2020 1632   CALCIUM 9.3 01/10/2020 1632   GFRNONAA >60 07/23/2018 1801   GFRAA >60 07/23/2018 1801   CrCl cannot be calculated (Patient's most recent lab result is older than the maximum 21 days allowed.).  COAG No results found for: INR, PROTIME  Radiology MR Shoulder Left Wo Contrast  Result Date: 02/02/2020 CLINICAL DATA:  Left shoulder pain and limited range of motion for 3-4 weeks EXAM: MRI OF THE LEFT SHOULDER WITHOUT CONTRAST TECHNIQUE: Multiplanar, multisequence MR imaging of the shoulder was performed. No intravenous contrast was administered. COMPARISON:  X-ray 01/10/2020 FINDINGS: Rotator cuff: Low-grade partial-thickness rim rent tear involving the mid to posterior portions of the supraspinatus tendon and likely involving the interdigitating fibers of the anterior infraspinatus tendon (series 7, images 11-12).  Mild supraspinatus and infraspinatus tendinosis. Subscapularis and teres minor tendons intact. No high-grade or full-thickness rotator cuff tear. Muscles: No atrophy or abnormal signal of the muscles of the rotator cuff. Biceps long head:  Mild intra-articular biceps tendinosis. Acromioclavicular Joint: Mild arthropathy of the acromioclavicular joint. Trace subacromial-subdeltoid bursal fluid/edema. Glenohumeral Joint: Mild chondral thinning without focal defect. No glenohumeral joint effusion. Labrum: Grossly intact, but evaluation is limited by lack of intraarticular fluid. Bones:  No marrow abnormality, fracture or dislocation. Other: None. IMPRESSION: 1. Low-grade partial-thickness rim rent tear involving the mid to posterior portions of the supraspinatus tendon and likely involving the interdigitating fibers of the anterior infraspinatus tendon. No high-grade or full-thickness rotator cuff tear. 2. Mild intra-articular biceps tendinosis. 3. Mild acromioclavicular and glenohumeral osteoarthritis. Electronically Signed   By: Davina Poke D.O.   On: 02/02/2020 09:40     Assessment/Plan 1. 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    I have had a long discussion with the patient regarding venous insufficiency and why it  causes symptoms, specifically venous ulceration . I have discussed with the patient the chronic skin changes that accompany venous insufficiency and the long term sequela such as infection and recurring  ulceration.  Patient will be placed in [employer] which will be changed weekly drainage permitting.  In addition, behavioral modification including several periods of elevation of the lower extremities during the day will be continued. Achieving a position with the ankles at heart level was stressed to the patient  The patient is instructed to begin routine exercise,  especially walking on a daily basis  Patient should undergo duplex ultrasound of the venous system to ensure that DVT or reflux is not present.  Following the review of the ultrasound the patient will follow up in one week to reassess the degree of swelling and the control that Unna therapy is offering.   The patient can be assessed for graduated compression stockings or wraps as well as a Lymph Pump once the ulcers are healed.   2. Chronic venous insufficiency No surgery or intervention at this point in time.    I have had a long discussion with the patient regarding venous insufficiency and why it  causes symptoms, specifically venous ulceration . I have discussed with the patient the chronic skin changes that accompany venous insufficiency and the long term sequela such as infection and recurring  ulceration.  Patient will be placed in [employer] which will be changed weekly drainage permitting.  In addition, behavioral modification including several periods of elevation of the lower extremities during the day will be continued. Achieving a position with the ankles at heart level was stressed to the patient  The patient is instructed to begin routine exercise, especially walking on a daily basis  Patient should undergo duplex ultrasound of the venous system to ensure that DVT or reflux is not present.  Following the review of the ultrasound the patient will follow up in one week to reassess the degree of swelling and the control that Unna therapy is offering.   The patient can be assessed for graduated compression stockings or wraps as well as a Lymph Pump once the ulcers are healed.   3. Essential hypertension Continue antihypertensive medications as already ordered, these medications have been reviewed and there are no changes at this time.   4. Hyperlipidemia, unspecified hyperlipidemia type Continue statin as ordered and reviewed, no changes at this time     Levora Dredge,  MD  02/11/2020 12:12 PM

## 2020-02-16 ENCOUNTER — Ambulatory Visit (INDEPENDENT_AMBULATORY_CARE_PROVIDER_SITE_OTHER): Payer: 59 | Admitting: Nurse Practitioner

## 2020-02-16 ENCOUNTER — Other Ambulatory Visit: Payer: Self-pay

## 2020-02-16 VITALS — BP 171/82 | HR 96 | Ht 61.0 in | Wt 218.0 lb

## 2020-02-16 DIAGNOSIS — L97302 Non-pressure chronic ulcer of unspecified ankle with fat layer exposed: Secondary | ICD-10-CM

## 2020-02-16 DIAGNOSIS — I83003 Varicose veins of unspecified lower extremity with ulcer of ankle: Secondary | ICD-10-CM | POA: Diagnosis not present

## 2020-02-16 NOTE — Progress Notes (Signed)
History of Present Illness  There is no documented history at this time  Assessments & Plan   There are no diagnoses linked to this encounter.    Additional instructions  Subjective:  Patient presents with venous ulcer of the Left lower extremity.    Procedure:  3 layer unna wrap was placed Left lower extremity.   Plan:   Follow up in one week.  

## 2020-02-17 ENCOUNTER — Encounter: Payer: Self-pay | Admitting: Family Medicine

## 2020-02-17 ENCOUNTER — Other Ambulatory Visit: Payer: Self-pay

## 2020-02-17 ENCOUNTER — Ambulatory Visit (INDEPENDENT_AMBULATORY_CARE_PROVIDER_SITE_OTHER): Payer: 59 | Admitting: Family Medicine

## 2020-02-17 DIAGNOSIS — G8929 Other chronic pain: Secondary | ICD-10-CM | POA: Insufficient documentation

## 2020-02-17 DIAGNOSIS — M2550 Pain in unspecified joint: Secondary | ICD-10-CM

## 2020-02-17 DIAGNOSIS — I10 Essential (primary) hypertension: Secondary | ICD-10-CM | POA: Diagnosis not present

## 2020-02-17 DIAGNOSIS — E611 Iron deficiency: Secondary | ICD-10-CM | POA: Diagnosis not present

## 2020-02-17 DIAGNOSIS — R351 Nocturia: Secondary | ICD-10-CM

## 2020-02-17 DIAGNOSIS — I872 Venous insufficiency (chronic) (peripheral): Secondary | ICD-10-CM

## 2020-02-17 DIAGNOSIS — R972 Elevated prostate specific antigen [PSA]: Secondary | ICD-10-CM | POA: Insufficient documentation

## 2020-02-17 DIAGNOSIS — R29898 Other symptoms and signs involving the musculoskeletal system: Secondary | ICD-10-CM

## 2020-02-17 MED ORDER — CARVEDILOL 6.25 MG PO TABS: 6.2500 mg | ORAL_TABLET | Freq: Two times a day (BID) | ORAL | 3 refills | Status: DC

## 2020-02-17 NOTE — Progress Notes (Signed)
Johnathan Rumps, MD Phone: [phone]  Oval Cavazos. is a 54 y.o. male who presents today for f/u.  HYPERTENSION  Disease Monitoring  Home BP Monitoring not checking. Chest pain-no    dyspnea-.  No Medications  Compliance-taking amlodipine.  Edema-chronic.  Improving with Unna boot through vascular surgery.  Left hand weakness: Patient has seen orthopedics.  They diagnosed him with a torn muscle in his shoulder.  They are also planning on doing nerve conduction studies to evaluate for nerve impingement.  No numbness or tingling.  He has difficulty making a fist.  Does report some tremor like he is on edge.  No significant anxiety.  No depression.  Chronic joint pain: Potentially arthritis related.  He wonders if he can take Omega XL for prolonged joint.  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  He defers any further work-up of this at this time.  Iron deficiency anemia: Mildly anemic.  Ferritin is normal though iron saturation is low.  Patient declines any further work-up of this at this time and notes he would not want an endoscopy or EGD.    Social History   Tobacco Use  Smoking Status Former Smoker  . Quit date: 02/08/2007  . Years since quitting: 13.0  Smokeless Tobacco Never Used     ROS see history of present illness  Objective  Physical Exam Vitals:   02/17/20 1321  BP: (!) 146/88  Pulse: 86  Temp: 97.8 F (36.6 C)  SpO2: 98%    BP Readings from Last 3 Encounters:  02/17/20 (!) 146/88  02/16/20 (!) 171/82  02/09/20 (!) 175/78   Wt Readings from Last 3 Encounters:  02/17/20 219 lb 9.6 oz (99.6 kg)  02/16/20 218 lb (98.9 kg)  02/09/20 217 lb (98.4 kg)    Physical Exam Constitutional:      General: He is not in acute distress.    Appearance: He is not diaphoretic.  Cardiovascular:     Rate and Rhythm: Normal rate and regular rhythm.     Heart sounds: Normal heart sounds.  Pulmonary:     Effort: Pulmonary effort is  normal.     Breath sounds: Normal breath sounds.  Musculoskeletal:     Comments: Unna boot in place on left leg  Skin:    General: Skin is warm and dry.  Neurological:     Mental Status: He is alert.     Comments: Much diminished strength of interosseous muscles in his left hand, bilateral grip, biceps, and triceps 5/5 strength, sensation to light touch intact bilateral upper extremities      Assessment/Plan: Please see individual problem list.  HTN (hypertension) Uncontrolled.  Discussed risk of stroke, heart attack, kidney dysfunction, and other vascular issues with chronic uncontrolled blood pressure.  He was hesitant to come in for additional labs in the next week or so and thus we will start on a beta-blocker.  Carvedilol sent to pharmacy.  Discussed if he developed any fatigue with this he will let us know.  Left hand weakness Seems that there is going to be some nerve impingement.  The question is where.  He will complete his nerve conduction study through orthopedics.  Chronic joint pain Discussed that he could try Omega XL.  It appears to be an omega-3 supplement.  Discussed that projoint has glucosamine and turmeric and it which would be okay to try though has multiple other ingredients that could potentially adversely interact with his other medicines.  Discussed  that in general supplements are not regulated or studied and we have difficulty predicting how they will interact with other medications.  Discussed avoiding the pro joint.  Iron deficiency Low iron saturation.  Discussed the need for GI evaluation with endoscopy and colonoscopy to rule out underlying causes.  Patient declines this and prefers to focus on his left hand issue.  We can revisit in the future.  Abnormal PSA PSA is low.  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  Discussed hormonal work-up for this and he was  previously advised to see urology though he defers these things in favor of completing evaluation of his left hand issue.  Nocturia Patient thinks this may be related to one of his medicines though I am unaware of this is a side effect of what he is taking.  Offered antibiotic based on positive nitrites at last visit though the patient declines that and opts to monitor.  Chronic venous insufficiency Edema is improving with Unna boot.   No orders of the defined types were placed in this encounter.   Meds ordered this encounter  Medications  . carvedilol (COREG) 6.25 MG tablet    Sig: Take 1 tablet (6.25 mg total) by mouth 2 (two) times daily with a meal.    Dispense:  60 tablet    Refill:  3    This visit occurred during the SARS-CoV-2 public health emergency.  Safety protocols were in place, including screening questions prior to the visit, additional usage of staff PPE, and extensive cleaning of exam room while observing appropriate contact time as indicated for disinfecting solutions.    Marikay Alar, MD [facility]

## 2020-02-17 NOTE — Assessment & Plan Note (Signed)
Seems that there is going to be some nerve impingement.  The question is where.  He will complete his nerve conduction study through orthopedics.

## 2020-02-17 NOTE — Assessment & Plan Note (Signed)
Low iron saturation.  Discussed the need for GI evaluation with endoscopy and colonoscopy to rule out underlying causes.  Patient declines this and prefers to focus on his left hand issue.  We can revisit in the future.

## 2020-02-17 NOTE — Patient Instructions (Addendum)
Nice to see you. We are going to add carvedilol to your regimen for your blood pressure.  You will continue your amlodipine. Please complete the work-up through orthopedics. It should be okay for you to take the Omega XL as this is a fish oil. If you notice any fatigue after starting the carvedilol please let us know.

## 2020-02-17 NOTE — Assessment & Plan Note (Signed)
Edema is improving with Unna boot.

## 2020-02-17 NOTE — Assessment & Plan Note (Signed)
Patient thinks this may be related to one of his medicines though I am unaware of this is a side effect of what he is taking.  Offered antibiotic based on positive nitrites at last visit though the patient declines that and opts to monitor.

## 2020-02-17 NOTE — Assessment & Plan Note (Signed)
Uncontrolled.  Discussed risk of stroke, heart attack, kidney dysfunction, and other vascular issues with chronic uncontrolled blood pressure.  He was hesitant to come in for additional labs in the next week or so and thus we will start on a beta-blocker.  Carvedilol sent to pharmacy.  Discussed if he developed any fatigue with this he will let us know.

## 2020-02-17 NOTE — Assessment & Plan Note (Signed)
Discussed that he could try Omega XL.  It appears to be an omega-3 supplement.  Discussed that projoint has glucosamine and turmeric and it which would be okay to try though has multiple other ingredients that could potentially adversely interact with his other medicines.  Discussed that in general supplements are not regulated or studied and we have difficulty predicting how they will interact with other medications.  Discussed avoiding the pro joint.

## 2020-02-17 NOTE — Assessment & Plan Note (Signed)
PSA is low.  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  Discussed hormonal work-up for this and he was previously advised to see urology though he defers these things in favor of completing evaluation of his left hand issue.

## 2020-02-19 ENCOUNTER — Encounter (INDEPENDENT_AMBULATORY_CARE_PROVIDER_SITE_OTHER): Payer: Self-pay | Admitting: Nurse Practitioner

## 2020-02-23 ENCOUNTER — Ambulatory Visit (INDEPENDENT_AMBULATORY_CARE_PROVIDER_SITE_OTHER): Payer: 59 | Admitting: Nurse Practitioner

## 2020-02-23 ENCOUNTER — Other Ambulatory Visit: Payer: Self-pay

## 2020-02-23 VITALS — BP 180/102 | HR 82 | Wt 219.0 lb

## 2020-02-23 DIAGNOSIS — I83003 Varicose veins of unspecified lower extremity with ulcer of ankle: Secondary | ICD-10-CM | POA: Diagnosis not present

## 2020-02-23 DIAGNOSIS — L97302 Non-pressure chronic ulcer of unspecified ankle with fat layer exposed: Secondary | ICD-10-CM | POA: Diagnosis not present

## 2020-02-23 NOTE — Progress Notes (Signed)
History of Present Illness  There is no documented history at this time  Assessments & Plan   There are no diagnoses linked to this encounter.    Additional instructions  Subjective:  Patient presents with venous ulcer of the Left lower extremity.    Procedure:  3 layer unna wrap was placed Left lower extremity.   Plan:   Follow up in one week.  

## 2020-02-24 ENCOUNTER — Encounter (INDEPENDENT_AMBULATORY_CARE_PROVIDER_SITE_OTHER): Payer: Self-pay | Admitting: Nurse Practitioner

## 2020-03-01 ENCOUNTER — Encounter (INDEPENDENT_AMBULATORY_CARE_PROVIDER_SITE_OTHER): Payer: Self-pay | Admitting: Nurse Practitioner

## 2020-03-01 ENCOUNTER — Other Ambulatory Visit: Payer: Self-pay

## 2020-03-01 ENCOUNTER — Ambulatory Visit (INDEPENDENT_AMBULATORY_CARE_PROVIDER_SITE_OTHER): Payer: 59 | Admitting: Nurse Practitioner

## 2020-03-01 VITALS — BP 158/84 | HR 82 | Ht 62.0 in | Wt 220.0 lb

## 2020-03-01 DIAGNOSIS — L97302 Non-pressure chronic ulcer of unspecified ankle with fat layer exposed: Secondary | ICD-10-CM

## 2020-03-01 DIAGNOSIS — I83003 Varicose veins of unspecified lower extremity with ulcer of ankle: Secondary | ICD-10-CM | POA: Diagnosis not present

## 2020-03-01 NOTE — Progress Notes (Signed)
History of Present Illness  There is no documented history at this time  Assessments & Plan   There are no diagnoses linked to this encounter.    Additional instructions  Subjective:  Patient presents with venous ulcer of the Left lower extremity.    Procedure:  3 layer unna wrap was placed Left lower extremity.   Plan:   Follow up in one week.  

## 2020-03-08 ENCOUNTER — Ambulatory Visit (INDEPENDENT_AMBULATORY_CARE_PROVIDER_SITE_OTHER): Payer: 59 | Admitting: Nurse Practitioner

## 2020-03-08 ENCOUNTER — Ambulatory Visit (INDEPENDENT_AMBULATORY_CARE_PROVIDER_SITE_OTHER): Payer: 59 | Admitting: Vascular Surgery

## 2020-03-08 ENCOUNTER — Other Ambulatory Visit: Payer: Self-pay

## 2020-03-08 VITALS — BP 138/82 | HR 79 | Ht 61.0 in | Wt 220.0 lb

## 2020-03-08 DIAGNOSIS — L97302 Non-pressure chronic ulcer of unspecified ankle with fat layer exposed: Secondary | ICD-10-CM

## 2020-03-08 DIAGNOSIS — I890 Lymphedema, not elsewhere classified: Secondary | ICD-10-CM | POA: Diagnosis not present

## 2020-03-08 DIAGNOSIS — I10 Essential (primary) hypertension: Secondary | ICD-10-CM

## 2020-03-08 DIAGNOSIS — I83003 Varicose veins of unspecified lower extremity with ulcer of ankle: Secondary | ICD-10-CM

## 2020-03-10 ENCOUNTER — Encounter (INDEPENDENT_AMBULATORY_CARE_PROVIDER_SITE_OTHER): Payer: Self-pay | Admitting: Nurse Practitioner

## 2020-03-10 NOTE — Progress Notes (Signed)
Subjective:    Patient ID: Johnathan Scott., male    DOB: June 18, 1966, 54 y.o.   MRN: [id number] Chief Complaint  Patient presents with  . Wound Check    unna boot check    Patient presents today for follow-up evaluation of his left lower extremity ulceration in addition to his lymphedema.  The ulceration of the patient's left lower extremity has greatly improved.  His bruises scabbed over at this point.  However it is a very superficial scab and a ulcers clearly visible underneath.  Patient denies any fever, chills, nausea, vomiting or diarrhea.  He has been tolerating his wraps well.   Review of Systems  Cardiovascular: Positive for leg swelling.  Skin: Positive for wound.  All other systems reviewed and are negative.      Objective:   Physical Exam Vitals reviewed.  Cardiovascular:     Rate and Rhythm: Regular rhythm.     Pulses: Normal pulses.  Musculoskeletal:     Right lower leg: Edema present.     Left lower leg: Edema present.  Neurological:     Mental Status: He is alert and oriented to person, place, and time.  Psychiatric:        Mood and Affect: Mood normal.        Behavior: Behavior normal.        Thought Content: Thought content normal.        Judgment: Judgment normal.     BP 138/82   Pulse 79   Ht 5\' 1"  (1.549 m)   Wt 220 lb (99.8 kg)   BMI 41.57 kg/m   Past Medical History:  Diagnosis Date  . Asthma   . Chickenpox     Social History   Socioeconomic History  . Marital status: Single    Spouse name: Not on file  . Number of children: Not on file  . Years of education: Not on file  . Highest education level: Not on file  Occupational History  . Not on file  Tobacco Use  . Smoking status: Former Smoker    Quit date: 02/08/2007    Years since quitting: 13.0  . Smokeless tobacco: Never Used  Substance and Sexual Activity  . Alcohol use: No    Alcohol/week: 0.0 standard drinks  . Drug use: No  . Sexual activity: Not on file  Other  Topics Concern  . Not on file  Social History Narrative  . Not on file   Social Determinants of Health   Financial Resource Strain:   . Difficulty of Paying Living Expenses:   Food Insecurity:   . Worried About Charity fundraiser in the Last Year:   . Arboriculturist in the Last Year:   Transportation Needs:   . Film/video editor (Medical):   Marland Kitchen Lack of Transportation (Non-Medical):   Physical Activity:   . Days of Exercise per Week:   . Minutes of Exercise per Session:   Stress:   . Feeling of Stress :   Social Connections:   . Frequency of Communication with Friends and Family:   . Frequency of Social Gatherings with Friends and Family:   . Attends Religious Services:   . Active Member of Clubs or Organizations:   . Attends Archivist Meetings:   Marland Kitchen Marital Status:   Intimate Partner Violence:   . Fear of Current or Ex-Partner:   . Emotionally Abused:   Marland Kitchen Physically Abused:   . Sexually Abused:  History reviewed. No pertinent surgical history.  Family History  Problem Relation Age of Onset  . Breast cancer Mother     Allergies  Allergen Reactions  . Novocain [Procaine] Other (See Comments)    Panic attack  . Cortisone Rash  . Hctz [Hydrochlorothiazide] Rash       Assessment & Plan:   1. Venous stasis ulcer of ankle with fat layer exposed with varicose veins, unspecified laterality (HCC) We will keep the patient in bilateral Unna wraps at this time to help with the venous ulceration as well as the swelling that has occurred on his right lower extremity.  After discussion with the patient if it occurs that the wound is completely healed prior to his next Unna wrap check, we may consider removing him sooner.  Otherwise the patient will continue to come to the office for lymphedema dressings and we will reevaluate the lower extremity wounds and edema in 4 weeks.  2. Lymphedema In addition to the Unna wraps the patient will continue with  conservative therapy including elevation of his lower extremities, and exercise.  When the patient is removed from his wraps he will also continue to wear medical grade 1 compression stockings on a daily basis.  3. Essential hypertension Continue antihypertensive medications as already ordered, these medications have been reviewed and there are no changes at this time.    Current Outpatient Medications on File Prior to Visit  Medication Sig Dispense Refill  . acetaminophen (TYLENOL) 500 MG tablet Take 1,000 mg by mouth every 6 (six) hours as needed.    Marland Kitchen amLODipine (NORVASC) 5 MG tablet Take 1 tablet (5 mg total) by mouth daily. 90 tablet 3  . amLODIPine Besylate (NORVASC PO) amlodipine    . carvedilol (COREG) 6.25 MG tablet Take 1 tablet (6.25 mg total) by mouth 2 (two) times daily with a meal. 60 tablet 3  . Cholecalciferol 1.25 MG (50000 UT) capsule Take 1 capsule (50,000 Units total) by mouth once a week. 13 capsule 1  . Cyclobenzaprine HCl (CYCLOBENZAPRINE 20 TD) cyclobenzaprine    . predniSONE (DELTASONE) 1 MG tablet prednisone    . IBUPROFEN PO Take by mouth in the morning, at noon, and at bedtime.     No current facility-administered medications on file prior to visit.    There are no Patient Instructions on file for this visit. No follow-ups on file.   Georgiana Spinner, NP

## 2020-03-15 ENCOUNTER — Ambulatory Visit (INDEPENDENT_AMBULATORY_CARE_PROVIDER_SITE_OTHER): Payer: 59 | Admitting: Nurse Practitioner

## 2020-03-15 ENCOUNTER — Other Ambulatory Visit: Payer: Self-pay

## 2020-03-15 ENCOUNTER — Encounter (INDEPENDENT_AMBULATORY_CARE_PROVIDER_SITE_OTHER): Payer: Self-pay

## 2020-03-15 VITALS — BP 170/78 | HR 91 | Resp 16 | Wt 220.0 lb

## 2020-03-15 DIAGNOSIS — I890 Lymphedema, not elsewhere classified: Secondary | ICD-10-CM | POA: Diagnosis not present

## 2020-03-15 NOTE — Progress Notes (Signed)
History of Present Illness  There is no documented history at this time  Assessments & Plan   There are no diagnoses linked to this encounter.    Additional instructions  Subjective:  Patient presents with venous ulcer of the Left lower extremity.    Procedure:  3 layer unna wrap was placed Left lower extremity.   Plan:   Follow up in one week.  

## 2020-03-16 ENCOUNTER — Encounter (INDEPENDENT_AMBULATORY_CARE_PROVIDER_SITE_OTHER): Payer: Self-pay | Admitting: Nurse Practitioner

## 2020-03-22 ENCOUNTER — Other Ambulatory Visit: Payer: Self-pay

## 2020-03-22 ENCOUNTER — Telehealth: Payer: Self-pay | Admitting: Family Medicine

## 2020-03-22 ENCOUNTER — Ambulatory Visit (INDEPENDENT_AMBULATORY_CARE_PROVIDER_SITE_OTHER): Payer: 59 | Admitting: Nurse Practitioner

## 2020-03-22 VITALS — BP 167/90 | HR 76 | Ht 61.0 in | Wt 219.0 lb

## 2020-03-22 DIAGNOSIS — L97302 Non-pressure chronic ulcer of unspecified ankle with fat layer exposed: Secondary | ICD-10-CM | POA: Diagnosis not present

## 2020-03-22 DIAGNOSIS — I83003 Varicose veins of unspecified lower extremity with ulcer of ankle: Secondary | ICD-10-CM | POA: Diagnosis not present

## 2020-03-22 NOTE — Telephone Encounter (Signed)
I called an spoke with the patient's brother and I informed him that Dr. Birdie Sons does not do permanent disability and that he would need to go to the specialist that will states he does not need to work because of his wrist, hips and legs.  He understood and I gave him the names of the specialist that the patient is eeing for his meeting with the social security to discuss the disability.  Beryle Bagsby,cma

## 2020-03-22 NOTE — Telephone Encounter (Signed)
Pt brother called for his brother wanting to get him on disability talking with social security please advise him what they need to do he pt has an appointment with ss on 04-03-20.

## 2020-03-22 NOTE — Progress Notes (Signed)
History of Present Illness  There is no documented history at this time  Assessments & Plan   There are no diagnoses linked to this encounter.    Additional instructions  Subjective:  Patient presents with venous ulcer of the Left lower extremity.    Procedure:  3 layer unna wrap was placed Left lower extremity.   Plan:   Follow up in one week.  

## 2020-03-26 ENCOUNTER — Encounter (INDEPENDENT_AMBULATORY_CARE_PROVIDER_SITE_OTHER): Payer: Self-pay | Admitting: Nurse Practitioner

## 2020-03-28 IMAGING — DX DG LUMBAR SPINE COMPLETE 4+V
5 series · 5 of 5 positions shown · non-contrast
Comparison: None.

CLINICAL DATA: Low back pain

EXAM:
LUMBAR SPINE - COMPLETE 4+ VIEW

[lumbar spine ap]
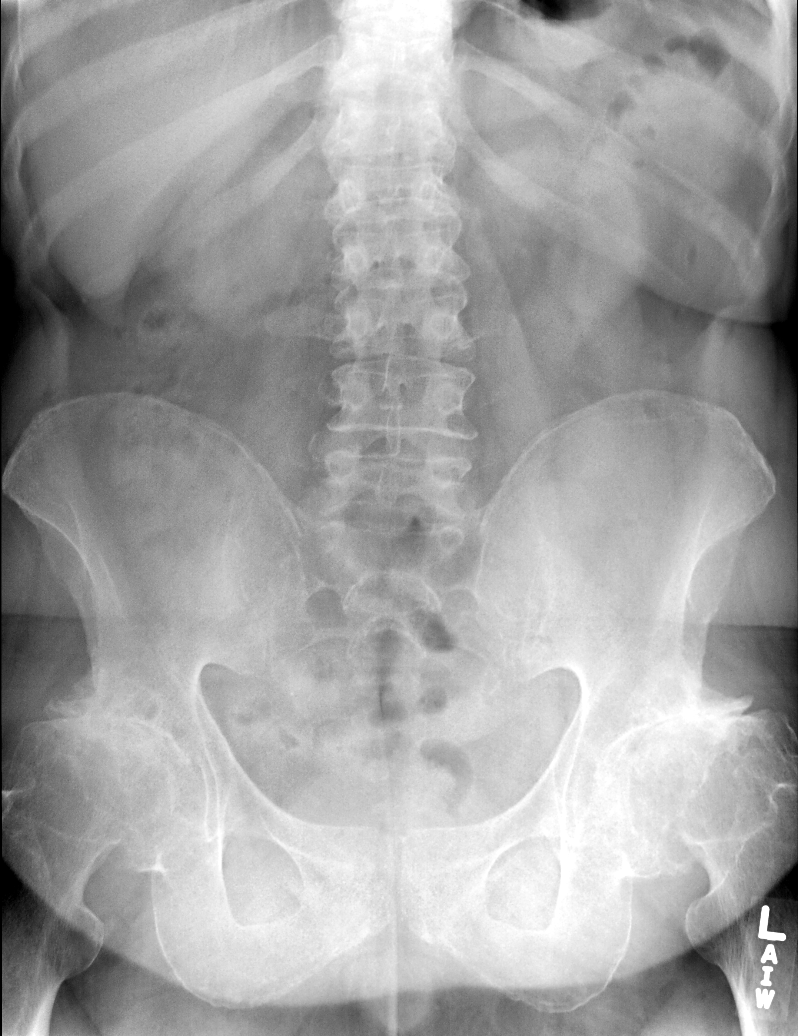

[lumbar spine obl (oblique) (1 of 2)]
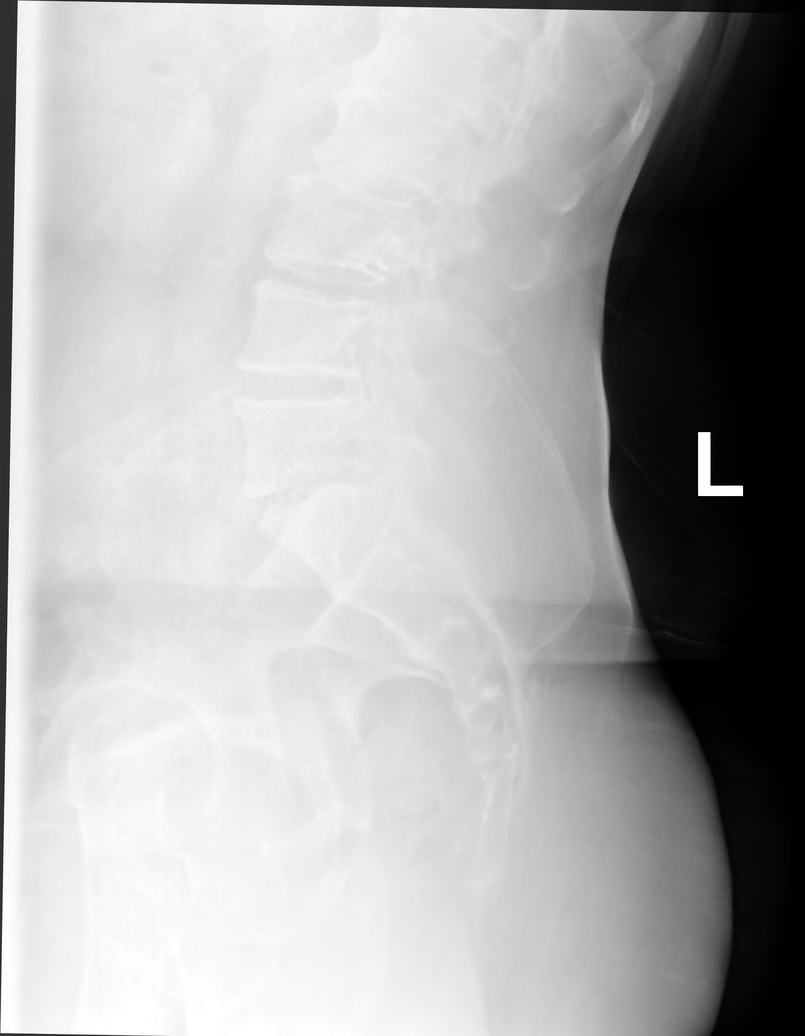

[lumbar spine obl (oblique) (2 of 2)]
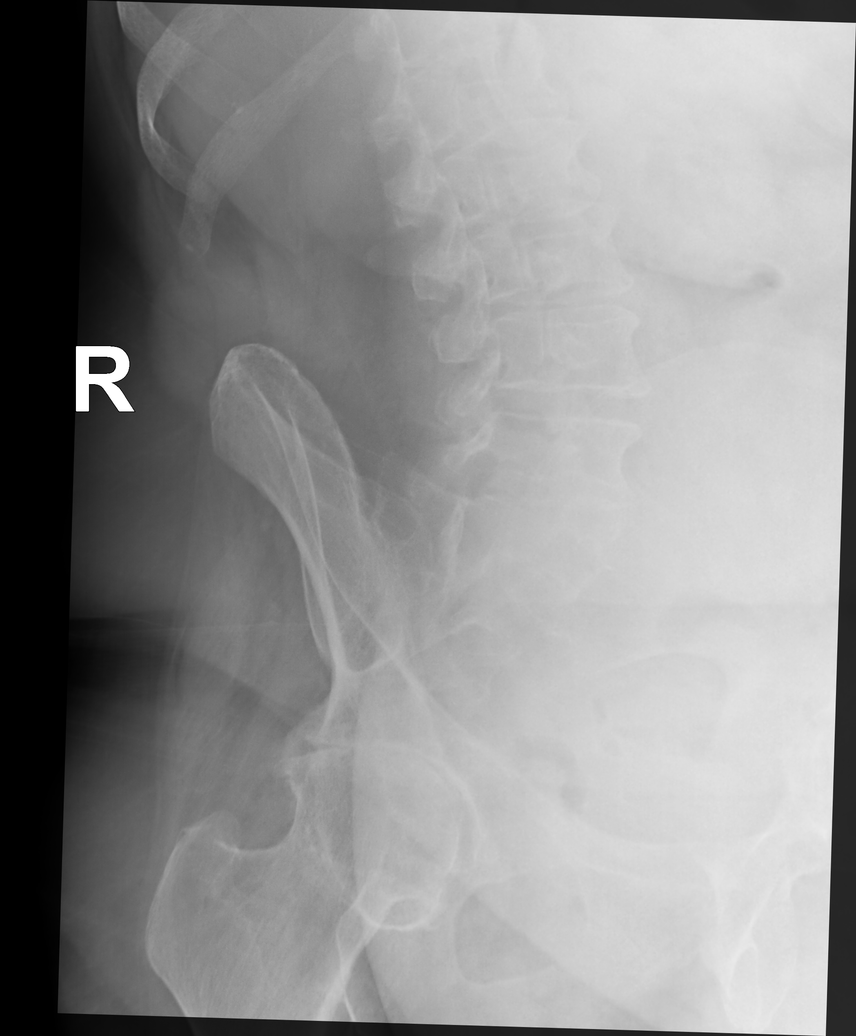

[lumbar spine lat]
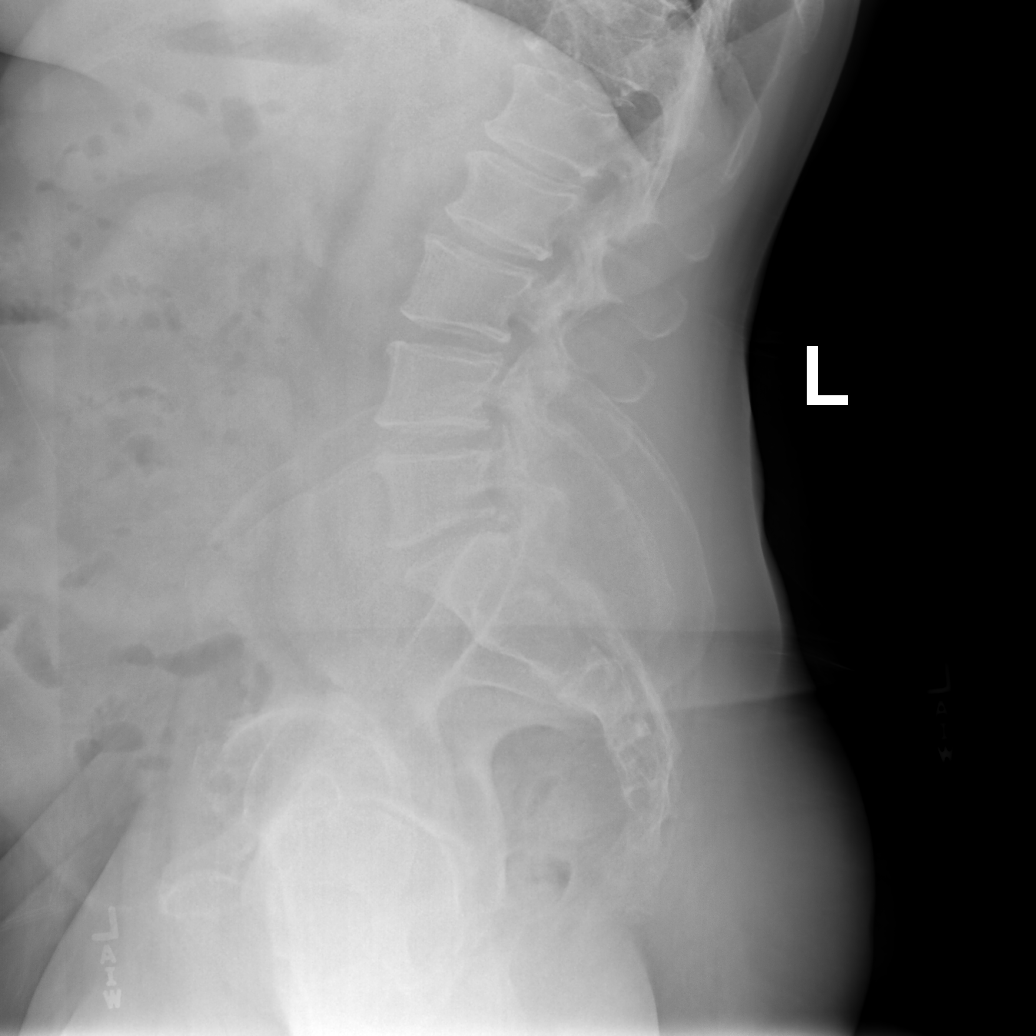

[lumbar spot lat]
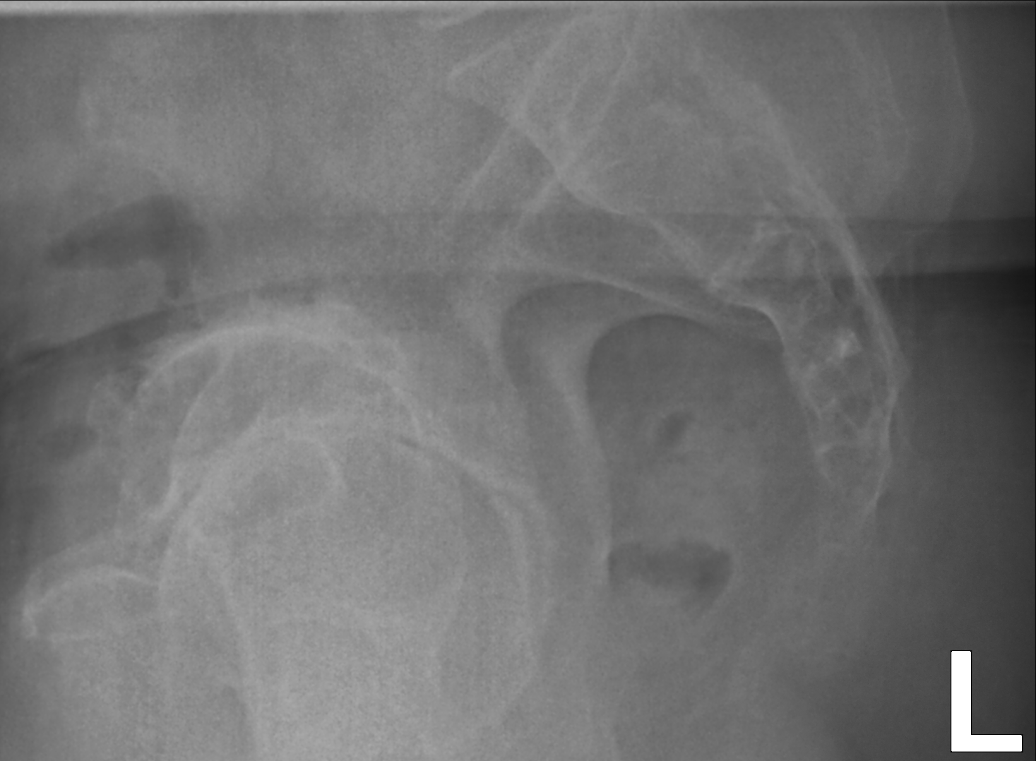

[5 of 5 positions shown; findings below may reference images not displayed]

FINDINGS: Frontal, lateral, spot lumbosacral lateral, and bilateral oblique
views were obtained. There are 5 non-rib-bearing lumbar type
vertebral bodies. There is no fracture or spondylolisthesis. Disc
spaces appear unremarkable. There is no appreciable facet
arthropathy.
IMPRESSION: No fracture or spondylolisthesis.  No appreciable arthropathy.

## 2020-03-29 ENCOUNTER — Ambulatory Visit (INDEPENDENT_AMBULATORY_CARE_PROVIDER_SITE_OTHER): Payer: 59 | Admitting: Nurse Practitioner

## 2020-03-29 ENCOUNTER — Other Ambulatory Visit: Payer: Self-pay

## 2020-03-29 VITALS — BP 154/82 | HR 67 | Ht 61.0 in | Wt 221.0 lb

## 2020-03-29 DIAGNOSIS — I83003 Varicose veins of unspecified lower extremity with ulcer of ankle: Secondary | ICD-10-CM | POA: Diagnosis not present

## 2020-03-29 DIAGNOSIS — L97302 Non-pressure chronic ulcer of unspecified ankle with fat layer exposed: Secondary | ICD-10-CM | POA: Diagnosis not present

## 2020-03-29 NOTE — Progress Notes (Signed)
History of Present Illness  There is no documented history at this time  Assessments & Plan   There are no diagnoses linked to this encounter.    Additional instructions  Subjective:  Patient presents with venous ulcer of the Left lower extremity.    Procedure:  3 layer unna wrap was placed Left lower extremity.   Plan:   Follow up in one week.  

## 2020-04-02 ENCOUNTER — Encounter (INDEPENDENT_AMBULATORY_CARE_PROVIDER_SITE_OTHER): Payer: Self-pay | Admitting: Nurse Practitioner

## 2020-04-05 ENCOUNTER — Ambulatory Visit (INDEPENDENT_AMBULATORY_CARE_PROVIDER_SITE_OTHER): Payer: 59 | Admitting: Nurse Practitioner

## 2020-04-05 ENCOUNTER — Other Ambulatory Visit: Payer: Self-pay

## 2020-04-05 ENCOUNTER — Telehealth: Payer: Self-pay | Admitting: Family Medicine

## 2020-04-05 ENCOUNTER — Encounter (INDEPENDENT_AMBULATORY_CARE_PROVIDER_SITE_OTHER): Payer: Self-pay | Admitting: Nurse Practitioner

## 2020-04-05 VITALS — BP 175/86 | HR 66 | Ht 61.0 in | Wt 219.0 lb

## 2020-04-05 DIAGNOSIS — I890 Lymphedema, not elsewhere classified: Secondary | ICD-10-CM | POA: Diagnosis not present

## 2020-04-05 DIAGNOSIS — L97302 Non-pressure chronic ulcer of unspecified ankle with fat layer exposed: Secondary | ICD-10-CM | POA: Diagnosis not present

## 2020-04-05 DIAGNOSIS — I83003 Varicose veins of unspecified lower extremity with ulcer of ankle: Secondary | ICD-10-CM | POA: Diagnosis not present

## 2020-04-05 DIAGNOSIS — I10 Essential (primary) hypertension: Secondary | ICD-10-CM | POA: Diagnosis not present

## 2020-04-09 ENCOUNTER — Encounter (INDEPENDENT_AMBULATORY_CARE_PROVIDER_SITE_OTHER): Payer: Self-pay | Admitting: Nurse Practitioner

## 2020-04-09 NOTE — Progress Notes (Signed)
Subjective:    Patient ID: Johnathan Garner., male    DOB: 18-Jan-1966, 54 y.o.   MRN: [id number] Chief Complaint  Patient presents with  . Follow-up    unnaboot check    Patient returns today for evaluation of his lower extremity edema as well as ulcerations.  Today the ulceration is mostly healed at this point.  The patient has been tolerating the Unna wraps well however at this time he is eager to come out of wraps.  He denies any weeping or worsening ulceration.  He denies any fever, chills, nausea, vomiting or diarrhea.  The patient does try to elevate his lower extremities much as possible.  She has no signs symptoms of cellulitis.   Review of Systems  Cardiovascular: Positive for leg swelling.  Musculoskeletal: Positive for gait problem.  Skin: Positive for wound.  All other systems reviewed and are negative.      Objective:   Physical Exam Vitals reviewed.  Pulmonary:     Effort: Pulmonary effort is normal.     Breath sounds: Normal breath sounds.  Musculoskeletal:     Right lower leg: 2+ Edema present.     Left lower leg: 2+ Edema present.  Skin:    Comments: Tiny shallow ulceration  Neurological:     Mental Status: He is alert and oriented to person, place, and time.  Psychiatric:        Mood and Affect: Mood normal.        Behavior: Behavior normal.        Thought Content: Thought content normal.        Judgment: Judgment normal.     BP (!) 175/86   Pulse 66   Ht 5\' 1"  (1.549 m)   Wt 219 lb (99.3 kg)   BMI 41.38 kg/m   Past Medical History:  Diagnosis Date  . Asthma   . Chickenpox     Social History   Socioeconomic History  . Marital status: Single    Spouse name: Not on file  . Number of children: Not on file  . Years of education: Not on file  . Highest education level: Not on file  Occupational History  . Not on file  Tobacco Use  . Smoking status: Former Smoker    Quit date: 02/08/2007    Years since quitting: 13.1  . Smokeless  tobacco: Never Used  Substance and Sexual Activity  . Alcohol use: No    Alcohol/week: 0.0 standard drinks  . Drug use: No  . Sexual activity: Not on file  Other Topics Concern  . Not on file  Social History Narrative  . Not on file   Social Determinants of Health   Financial Resource Strain:   . Difficulty of Paying Living Expenses:   Food Insecurity:   . Worried About Charity fundraiser in the Last Year:   . Arboriculturist in the Last Year:   Transportation Needs:   . Film/video editor (Medical):   Marland Kitchen Lack of Transportation (Non-Medical):   Physical Activity:   . Days of Exercise per Week:   . Minutes of Exercise per Session:   Stress:   . Feeling of Stress :   Social Connections:   . Frequency of Communication with Friends and Family:   . Frequency of Social Gatherings with Friends and Family:   . Attends Religious Services:   . Active Member of Clubs or Organizations:   . Attends Archivist Meetings:   .  Marital Status:   Intimate Partner Violence:   . Fear of Current or Ex-Partner:   . Emotionally Abused:   Marland Kitchen Physically Abused:   . Sexually Abused:     No past surgical history on file.  Family History  Problem Relation Age of Onset  . Breast cancer Mother     Allergies  Allergen Reactions  . Novocain [Procaine] Other (See Comments)    Panic attack  . Cortisone Rash  . Hctz [Hydrochlorothiazide] Rash       Assessment & Plan:   1. 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  This is due to the fact that a small ulceration could become a large ulceration without compression.  Patient does understand and will be obtaining compression socks soon as he has had some ordered.  Otherwise we will have the patient return in 2 months to reassess edema as well as  ulceration.  However, if the patient swells worse or the ulceration worsens patient is advised to contact her office to be seen sooner.  2. Lymphedema No surgery or intervention at this point in time.    I have reviewed my discussion with the patient regarding venous insufficiency and secondary lymph edema and why it  causes symptoms. I have discussed with the patient the chronic skin changes that accompany these problems and the long term sequela such as ulceration and infection.  Patient will continue wearing graduated compression stockings class 1 (20-30 mmHg) on a daily basis a prescription was given to the patient to keep this updated. The patient will  put the stockings on first thing in the morning and removing them in the evening. The patient is instructed specifically not to sleep in the stockings.  In addition, behavioral modification including elevation during the day will be continued.  Diet and salt restriction was also discussed.    3. Essential hypertension Continue antihypertensive medications as already ordered, these medications have been reviewed and there are no changes at this time.    Current Outpatient Medications on File Prior to Visit  Medication Sig Dispense Refill  . acetaminophen (TYLENOL) 500 MG tablet Take 1,000 mg by mouth every 6 (six) hours as needed.    Marland Kitchen amLODipine (NORVASC) 5 MG tablet Take 1 tablet (5 mg total) by mouth daily. 90 tablet 3  . amLODIPine Besylate (NORVASC PO) amlodipine    . carvedilol (COREG) 6.25 MG tablet Take 1 tablet (6.25 mg total) by mouth 2 (two) times daily with a meal. 60 tablet 3  . Cholecalciferol 1.25 MG (50000 UT) capsule Take 1 capsule (50,000 Units total) by mouth once a week. 13 capsule 1  . Cyclobenzaprine HCl (CYCLOBENZAPRINE 20 TD) cyclobenzaprine    . IBUPROFEN PO Take by mouth in the morning, at noon, and at bedtime.    . predniSONE (DELTASONE) 1 MG tablet prednisone     No current facility-administered medications on  file prior to visit.    There are no Patient Instructions on file for this visit. No follow-ups on file.   Georgiana Spinner, NP

## 2020-06-07 ENCOUNTER — Ambulatory Visit (INDEPENDENT_AMBULATORY_CARE_PROVIDER_SITE_OTHER): Payer: 59 | Admitting: Vascular Surgery

## 2022-08-18 ENCOUNTER — Encounter (INDEPENDENT_AMBULATORY_CARE_PROVIDER_SITE_OTHER): Payer: Self-pay

## 2022-11-25 ENCOUNTER — Telehealth: Payer: Self-pay | Admitting: Family Medicine

## 2022-11-25 NOTE — Telephone Encounter (Signed)
Pt brother would like to be called concerning the pt

## 2022-11-26 NOTE — Telephone Encounter (Addendum)
Patient called and stated he has no insurance and he wants to know how much would his office visit be on next Wednesday with the provider.  Roniel Halloran,cma .

## 2022-11-27 ENCOUNTER — Telehealth: Payer: Self-pay | Admitting: Family Medicine

## 2022-11-27 NOTE — Telephone Encounter (Signed)
error 

## 2022-12-03 ENCOUNTER — Ambulatory Visit (INDEPENDENT_AMBULATORY_CARE_PROVIDER_SITE_OTHER): Payer: Self-pay | Admitting: Family Medicine

## 2022-12-03 ENCOUNTER — Encounter: Payer: Self-pay | Admitting: Family Medicine

## 2022-12-03 VITALS — BP 190/90 | HR 68 | Temp 98.0°F | Resp 16 | Ht 61.0 in | Wt 199.4 lb

## 2022-12-03 DIAGNOSIS — G8929 Other chronic pain: Secondary | ICD-10-CM

## 2022-12-03 DIAGNOSIS — M2550 Pain in unspecified joint: Secondary | ICD-10-CM

## 2022-12-03 DIAGNOSIS — M25551 Pain in right hip: Secondary | ICD-10-CM

## 2022-12-03 DIAGNOSIS — I10 Essential (primary) hypertension: Secondary | ICD-10-CM

## 2022-12-03 DIAGNOSIS — I83003 Varicose veins of unspecified lower extremity with ulcer of ankle: Secondary | ICD-10-CM

## 2022-12-03 DIAGNOSIS — M25552 Pain in left hip: Secondary | ICD-10-CM

## 2022-12-03 DIAGNOSIS — F322 Major depressive disorder, single episode, severe without psychotic features: Secondary | ICD-10-CM

## 2022-12-03 DIAGNOSIS — L97302 Non-pressure chronic ulcer of unspecified ankle with fat layer exposed: Secondary | ICD-10-CM

## 2022-12-03 NOTE — Patient Instructions (Signed)
Nice to see you. I have referred you to orthopedics.  If you do not hear from them in the next several weeks please let us know.  Please try to get the patient assistance form filled out so that we can potentially get you access to medical care at no cost.

## 2022-12-05 DIAGNOSIS — F322 Major depressive disorder, single episode, severe without psychotic features: Secondary | ICD-10-CM | POA: Insufficient documentation

## 2022-12-05 NOTE — Assessment & Plan Note (Addendum)
Chronic issue.  Severely uncontrolled.  Discussed risk of stroke, heart attack, kidney dysfunction, and other vascular issues with chronic uncontrolled blood pressure.  He does not have insurance at this point and does not want lab work.  He does not want to go on medication for this.  He is understanding of the risks of this being untreated.  I did discuss it is incredibly unlikely that anybody would perform a surgery on his hips with his blood pressure being uncontrolled.

## 2022-12-05 NOTE — Assessment & Plan Note (Signed)
Patient with chronic left shoulder pain.  He could discuss this further with orthopedics when he sees them.

## 2022-12-05 NOTE — Assessment & Plan Note (Signed)
Chronic issue.  Patient likely has osteoarthritis in his hips.  Discussed the next steps would include x-rays and seeing orthopedics.  Discussed that his specialist would be the one to determine if he met criteria for disability.  Discussed disability would need to come through the Social Security department and he should start that process as he is able to.  I will refer him to orthopedics.  I did discuss that I do not manage long-term disability.

## 2022-12-05 NOTE — Progress Notes (Signed)
Johnathan Rumps, MD Phone: [phone]  Johnathan Scott. is a 57 y.o. male who presents today for follow-up.  Chronic hip pain: This is a chronic issue.  It is worse if he hits something or trips.  If he sitting there it is not painful.  If he stands for a long time it will hurt.  He can stand for an hour before he has to sit down.  He notes no injury.  He is not taking any medication for this.  Patient wonders about getting disability.  Hypertension: Patient is no longer taking medication for this.  He does not check his blood pressure.  He notes no chest pain or shortness of breath.  He has chronic edema that is unchanged.  Depression: This has been going on 3 to 4 years.  He recently lost his job and lives by himself.  There are some issues with his family.  He reports thoughts of being better off dead though does not endorse any active suicidal plan or intent.  He notes he would not kill himself because he is religious and would not go to heaven if he did so.  Leg wound: Patient continues to have a wound on his left leg.  He saw wound care and they would get it almost healed up though it eventually busted back open.  He is not doing anything specifically for it other than keeping it clean with soap and water.  Left shoulder pain: This is a chronic issue related to an injury on the job.  He does not take any medication for this.  Social History   Tobacco Use  Smoking Status Former   Types: Cigarettes   Quit date: 02/08/2007   Years since quitting: 15.8  Smokeless Tobacco Never    Current Outpatient Medications on File Prior to Visit  Medication Sig Dispense Refill   acetaminophen (TYLENOL) 500 MG tablet Take 1,000 mg by mouth every 6 (six) hours as needed. (Patient not taking: Reported on 12/03/2022)     amLODipine (NORVASC) 5 MG tablet Take 1 tablet (5 mg total) by mouth daily. (Patient not taking: Reported on 12/03/2022) 90 tablet 3   amLODIPine Besylate (NORVASC PO) amlodipine  (Patient not taking: Reported on 12/03/2022)     carvedilol (COREG) 6.25 MG tablet Take 1 tablet (6.25 mg total) by mouth 2 (two) times daily with a meal. (Patient not taking: Reported on 12/03/2022) 60 tablet 3   Cholecalciferol 1.25 MG (50000 UT) capsule Take 1 capsule (50,000 Units total) by mouth once a week. (Patient not taking: Reported on 12/03/2022) 13 capsule 1   Cyclobenzaprine HCl (CYCLOBENZAPRINE 20 TD) cyclobenzaprine (Patient not taking: Reported on 12/03/2022)     IBUPROFEN PO Take by mouth in the morning, at noon, and at bedtime. (Patient not taking: Reported on 12/03/2022)     predniSONE (DELTASONE) 1 MG tablet prednisone (Patient not taking: Reported on 12/03/2022)     No current facility-administered medications on file prior to visit.     ROS see history of present illness  Objective  Physical Exam Vitals:   12/03/22 1640 12/03/22 1709  BP: (!) 199/100 (!) 190/90  Pulse: 68   Resp: 16   Temp: 98 F (36.7 C)   SpO2: 98%     BP Readings from Last 3 Encounters:  12/03/22 (!) 190/90  04/05/20 (!) 175/86  03/29/20 (!) 154/82   Wt Readings from Last 3 Encounters:  12/03/22 199 lb 6 oz (90.4 kg)  04/05/20 219 lb (99.3 kg)  03/29/20 221 lb (100.2 kg)    Physical Exam Constitutional:      General: He is not in acute distress.    Appearance: He is not diaphoretic.  Cardiovascular:     Rate and Rhythm: Normal rate and regular rhythm.     Heart sounds: Normal heart sounds.  Pulmonary:     Effort: Pulmonary effort is normal.     Breath sounds: Normal breath sounds.  Musculoskeletal:     Comments: Very poor internal and external range of motion bilateral hips, large open wound on his left anterior lower leg, no surrounding erythema, no purulent drainage  Skin:    General: Skin is warm and dry.  Neurological:     Mental Status: He is alert.      Assessment/Plan: Please see individual problem list.  Bilateral hip pain Assessment & Plan: Chronic issue.   Patient likely has osteoarthritis in his hips.  Discussed the next steps would include x-rays and seeing orthopedics.  Discussed that his specialist would be the one to determine if he met criteria for disability.  Discussed disability would need to come through the Social Security department and he should start that process as he is able to.  I will refer him to orthopedics.  I did discuss that I do not manage long-term disability.  Orders: -     Ambulatory referral to Orthopedic Surgery  Primary hypertension Assessment & Plan: Chronic issue.  Severely uncontrolled.  Discussed risk of stroke, heart attack, kidney dysfunction, and other vascular issues with chronic uncontrolled blood pressure.  He does not have insurance at this point and does not want lab work.  He does not want to go on medication for this.  He is understanding of the risks of this being untreated.  I did discuss it is incredibly unlikely that anybody would perform a surgery on his hips with his blood pressure being uncontrolled.   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  There are no signs of infection.  The patient does not want to go back to wound care.  He will continue to keep the area cleansed with soap and water.   Chronic joint pain Assessment & Plan: Patient with chronic left shoulder pain.  He could discuss this further with orthopedics when he sees them.   Depression, major, single episode, severe ([place]) Assessment & Plan: Ongoing issues.  Patient declines medication for this.  He will monitor.  Advised to seek medical attention the emergency room if he develops suicidal plan or intent.     Return in about 3 months (around 03/03/2023) for once patient gets insurance.  I have spent 33 minutes in the care of this patient regarding history taking, completion of exam, discussion of plan, placing  orders.   Johnathan Rumps, MD [place]

## 2022-12-05 NOTE — Assessment & Plan Note (Signed)
Ongoing issues.  Patient declines medication for this.  He will monitor.  Advised to seek medical attention the emergency room if he develops suicidal plan or intent.

## 2022-12-05 NOTE — Assessment & Plan Note (Signed)
This is still present.  There are no signs of infection.  The patient does not want to go back to wound care.  He will continue to keep the area cleansed with soap and water.

## 2022-12-25 ENCOUNTER — Encounter: Payer: Self-pay | Admitting: Orthopaedic Surgery

## 2022-12-25 ENCOUNTER — Ambulatory Visit (INDEPENDENT_AMBULATORY_CARE_PROVIDER_SITE_OTHER): Payer: Self-pay

## 2022-12-25 ENCOUNTER — Ambulatory Visit (INDEPENDENT_AMBULATORY_CARE_PROVIDER_SITE_OTHER): Payer: Self-pay | Admitting: Orthopaedic Surgery

## 2022-12-25 DIAGNOSIS — G8929 Other chronic pain: Secondary | ICD-10-CM

## 2022-12-25 DIAGNOSIS — M545 Low back pain, unspecified: Secondary | ICD-10-CM

## 2022-12-25 DIAGNOSIS — M1611 Unilateral primary osteoarthritis, right hip: Secondary | ICD-10-CM

## 2022-12-25 DIAGNOSIS — M1612 Unilateral primary osteoarthritis, left hip: Secondary | ICD-10-CM

## 2022-12-25 DIAGNOSIS — M542 Cervicalgia: Secondary | ICD-10-CM

## 2022-12-25 NOTE — Progress Notes (Signed)
Office Visit Note   Patient: Johnathan Scott.           Date of Birth: 1966/01/08           MRN: [id number] Visit Date: 12/25/2022              Requested by: [name], MD [id number] [place],  [place] [address] PCP: [name], MD   Assessment & Plan: Visit Diagnoses:  1. Chronic low back pain, unspecified back pain laterality, unspecified whether sciatica present   2. Neck pain   3. Primary osteoarthritis of right hip   4. Primary osteoarthritis of left hip     Plan: Impression is severe bilateral hip osteoarthritis and chronic neck and back pain.  I explained the severity of the hip disease and recommended total hip replacement as this would significantly improve his quality of life and function.  Because of the severity I do not feel the medications or injections will provide any relief.  Patient refuses surgery and basically just wants to be on disability and be able to get into his truck.  He will follow-up with [place] if he changes his mind.  Continue symptomatic treatment for the neck and back.  Follow-Up Instructions: No follow-ups on file.   Orders:  Orders Placed This Encounter  Procedures   XR Lumbar Spine 2-3 Views   XR Cervical Spine 2 or 3 views   XR Pelvis 1-2 Views   No orders of the defined types were placed in this encounter.     Procedures: No procedures performed   Clinical Data: No additional findings.   Subjective: Chief Complaint  Patient presents with   Neck - Pain   Lower Back - Pain    HPI  Patient is a 57 year old male comes in for low back pain and neck pain with cracking sounds.  Uses a cane.  Has an altered gait.  Currently unemployed.  Seeking disability.  Denies any injuries to the neck back or hips.  Review of Systems  Constitutional: Negative.   HENT: Negative.    Eyes: Negative.   Respiratory: Negative.    Cardiovascular: Negative.   Gastrointestinal: Negative.   Endocrine: Negative.    Genitourinary: Negative.   Skin: Negative.   Allergic/Immunologic: Negative.   Neurological: Negative.   Hematological: Negative.   Psychiatric/Behavioral: Negative.    All other systems reviewed and are negative.    Objective: Vital Signs: There were no vitals taken for this visit.  Physical Exam Vitals and nursing note reviewed.  Constitutional:      Appearance: He is well-developed.  HENT:     Head: Normocephalic and atraumatic.  Eyes:     Pupils: Pupils are equal, round, and reactive to light.  Pulmonary:     Effort: Pulmonary effort is normal.  Abdominal:     Palpations: Abdomen is soft.  Musculoskeletal:        General: Normal range of motion.     Cervical back: Neck supple.  Skin:    General: Skin is warm.  Neurological:     Mental Status: He is alert and oriented to person, place, and time.  Psychiatric:        Behavior: Behavior normal.        Thought Content: Thought content normal.        Judgment: Judgment normal.     Ortho Exam  Examination of the cervical and lumbar spine are nonfocal. Examination of bilateral hips shows severe pain  and limitation in range of motion.  Specialty Comments:  No specialty comments available.  Imaging: No results found.   PMFS History: Patient Active Problem List   Diagnosis Date Noted   Depression, major, single episode, severe ([place]) 12/05/2022   Left hand weakness 02/17/2020   Chronic joint pain 02/17/2020   Abnormal PSA 02/17/2020   Nocturia 02/17/2020   Venous ulcer of ankle ([place]) 01/11/2020   Chronic venous insufficiency 01/11/2020   Vitamin D deficiency 01/11/2020   HLD (hyperlipidemia) 01/11/2020   Iron deficiency 01/11/2020   Lymphedema 01/10/2020   Low back pain without sciatica 01/10/2020   Poor vision 12/10/2018   Left arm pain 12/10/2018   Bilateral hip pain 11/22/2018   HTN (hypertension) 08/09/2018   Anemia 02/20/2016   Bilateral lower extremity edema 01/20/2016   Palpitations 01/20/2016    Panic attacks 01/20/2016   Past Medical History:  Diagnosis Date   Asthma    Chickenpox     Family History  Problem Relation Age of Onset   Breast cancer Mother     History reviewed. No pertinent surgical history. Social History   Occupational History   Not on file  Tobacco Use   Smoking status: Former    Types: Cigarettes    Quit date: 02/08/2007    Years since quitting: 15.8   Smokeless tobacco: Never  Substance and Sexual Activity   Alcohol use: No    Alcohol/week: 0.0 standard drinks of alcohol   Drug use: No   Sexual activity: Not on file

## 2024-02-23 ENCOUNTER — Emergency Department: Payer: Self-pay

## 2024-02-23 ENCOUNTER — Other Ambulatory Visit: Payer: Self-pay

## 2024-02-23 ENCOUNTER — Inpatient Hospital Stay
Admission: EM | Admit: 2024-02-23 | Discharge: 2024-02-25 | DRG: 603 | Disposition: A | Discharge status: Home / Self Care | Payer: Self-pay | Attending: Internal Medicine | Admitting: Internal Medicine

## 2024-02-23 DIAGNOSIS — I739 Peripheral vascular disease, unspecified: Secondary | ICD-10-CM | POA: Diagnosis present

## 2024-02-23 DIAGNOSIS — M7989 Other specified soft tissue disorders: Secondary | ICD-10-CM | POA: Diagnosis not present

## 2024-02-23 DIAGNOSIS — Z5982 Transportation insecurity: Secondary | ICD-10-CM

## 2024-02-23 DIAGNOSIS — L03116 Cellulitis of left lower limb: Secondary | ICD-10-CM | POA: Diagnosis not present

## 2024-02-23 DIAGNOSIS — Z7952 Long term (current) use of systemic steroids: Secondary | ICD-10-CM

## 2024-02-23 DIAGNOSIS — Z6837 Body mass index (BMI) 37.0-37.9, adult: Secondary | ICD-10-CM

## 2024-02-23 DIAGNOSIS — E66812 Obesity, class 2: Secondary | ICD-10-CM | POA: Diagnosis present

## 2024-02-23 DIAGNOSIS — Z87891 Personal history of nicotine dependence: Secondary | ICD-10-CM

## 2024-02-23 DIAGNOSIS — Z66 Do not resuscitate: Secondary | ICD-10-CM | POA: Diagnosis present

## 2024-02-23 DIAGNOSIS — W57XXXA Bitten or stung by nonvenomous insect and other nonvenomous arthropods, initial encounter: Secondary | ICD-10-CM | POA: Diagnosis present

## 2024-02-23 DIAGNOSIS — D649 Anemia, unspecified: Secondary | ICD-10-CM | POA: Diagnosis present

## 2024-02-23 DIAGNOSIS — Z791 Long term (current) use of non-steroidal anti-inflammatories (NSAID): Secondary | ICD-10-CM

## 2024-02-23 DIAGNOSIS — N179 Acute kidney failure, unspecified: Secondary | ICD-10-CM | POA: Diagnosis present

## 2024-02-23 DIAGNOSIS — Z888 Allergy status to other drugs, medicaments and biological substances status: Secondary | ICD-10-CM

## 2024-02-23 DIAGNOSIS — I10 Essential (primary) hypertension: Secondary | ICD-10-CM | POA: Diagnosis present

## 2024-02-23 DIAGNOSIS — I890 Lymphedema, not elsewhere classified: Secondary | ICD-10-CM | POA: Diagnosis present

## 2024-02-23 DIAGNOSIS — E876 Hypokalemia: Secondary | ICD-10-CM | POA: Diagnosis present

## 2024-02-23 DIAGNOSIS — Z8619 Personal history of other infectious and parasitic diseases: Secondary | ICD-10-CM

## 2024-02-23 DIAGNOSIS — R350 Frequency of micturition: Secondary | ICD-10-CM | POA: Diagnosis present

## 2024-02-23 DIAGNOSIS — Z604 Social exclusion and rejection: Secondary | ICD-10-CM | POA: Diagnosis present

## 2024-02-23 DIAGNOSIS — J45909 Unspecified asthma, uncomplicated: Secondary | ICD-10-CM | POA: Diagnosis present

## 2024-02-23 DIAGNOSIS — Z884 Allergy status to anesthetic agent status: Secondary | ICD-10-CM

## 2024-02-23 DIAGNOSIS — Z79899 Other long term (current) drug therapy: Secondary | ICD-10-CM

## 2024-02-23 LAB — COMPREHENSIVE METABOLIC PANEL WITH GFR
ALT: 45 U/L — ABNORMAL HIGH (ref 0–44)
AST: 44 U/L — ABNORMAL HIGH (ref 15–41)
Albumin: 3.4 g/dL — ABNORMAL LOW (ref 3.5–5.0)
Alkaline Phosphatase: 54 U/L (ref 38–126)
Anion gap: 13 (ref 5–15)
BUN: 36 mg/dL — ABNORMAL HIGH (ref 6–20)
CO2: 18 mmol/L — ABNORMAL LOW (ref 22–32)
Calcium: 8.9 mg/dL (ref 8.9–10.3)
Chloride: 104 mmol/L (ref 98–111)
Creatinine, Ser: 2.01 mg/dL — ABNORMAL HIGH (ref 0.61–1.24)
GFR, Estimated: 37 mL/min — ABNORMAL LOW (ref >60)
Glucose, Bld: 99 mg/dL (ref 70–99)
Potassium: 3.2 mmol/L — ABNORMAL LOW (ref 3.5–5.1)
Sodium: 135 mmol/L (ref 135–145)
Total Bilirubin: 0.7 mg/dL (ref 0.0–1.2)
Total Protein: 7.6 g/dL (ref 6.5–8.1)

## 2024-02-23 LAB — CBC
HCT: 34.6 % — ABNORMAL LOW (ref 39.0–52.0)
Hemoglobin: 11.3 g/dL — ABNORMAL LOW (ref 13.0–17.0)
MCH: 29.4 pg (ref 26.0–34.0)
MCHC: 32.7 g/dL (ref 30.0–36.0)
MCV: 90.1 fL (ref 80.0–100.0)
Platelets: 158 10*3/uL (ref 150–400)
RBC: 3.84 MIL/uL — ABNORMAL LOW (ref 4.22–5.81)
RDW: 12.9 % (ref 11.5–15.5)
WBC: 11.2 10*3/uL — ABNORMAL HIGH (ref 4.0–10.5)
nRBC: 0 % (ref 0.0–0.2)

## 2024-02-23 LAB — LACTIC ACID, PLASMA: Lactic Acid, Venous: 1 mmol/L (ref 0.5–1.9)

## 2024-02-23 MED ORDER — SODIUM CHLORIDE 0.9 % IV BOLUS
1000.0000 mL | Freq: Once | INTRAVENOUS | Status: AC
  Administered 2024-02-23: 1000 mL via INTRAVENOUS

## 2024-02-23 MED ORDER — SODIUM CHLORIDE 0.9 % IV SOLN
1.0000 g | Freq: Once | INTRAVENOUS | Status: AC
  Administered 2024-02-23: 1 g via INTRAVENOUS
  Filled 2024-02-23: qty 10

## 2024-02-23 NOTE — ED Triage Notes (Signed)
 First Nurse Note: Patient to ED from [facility] for left leg swelling- has ulcer. Ongoing x 6 years. Having drainage and bleeding. Hx of lymphedema. Also having HTN. Has not seen doctor in "years."  170/120

## 2024-02-23 NOTE — ED Notes (Signed)
 CCMD notified of pt needing monitoring

## 2024-02-23 NOTE — ED Notes (Signed)
 Lab paged to draw cultures.

## 2024-02-23 NOTE — ED Notes (Signed)
 Pt assisted to in room commode, changed into gown, assisted on to stretcher locked in lowest position with call bell in reach.

## 2024-02-23 NOTE — ED Provider Notes (Signed)
 [facility] Provider Note    Event Date/Time   First MD Initiated Contact with Patient 02/23/24 1930     (approximate)   History   Blood Infection   HPI  Johnathan Scott. is a 58 year old male with history of lymphedema, venous ulcer of the ankle presenting to the emergency department for evaluation of leg swelling.  Over the past 3 days he has noticed increasing redness over his left lower extremity.  Has an ulcer over his left leg, reports that this is not significantly changed, denies recent drainage or bleeding to me that this was noted in triage.  Reports he has not seen a doctor in several years.  Tells me this is very similar to when he has had skin infections in the past and these resolved with oral antibiotics.    Physical Exam   Triage Vital Signs: ED Triage Vitals  Encounter Vitals Group     BP 02/23/24 1240 (!) 162/89     Systolic BP Percentile --      Diastolic BP Percentile --      Pulse Rate 02/23/24 1240 63     Resp 02/23/24 1240 18     Temp 02/23/24 1240 98 F (36.7 C)     Temp Source 02/23/24 1240 Oral     SpO2 02/23/24 1240 100 %     Weight 02/23/24 1241 199 lb 4.7 oz (90.4 kg)     Height 02/23/24 1241 5\' 1"  (1.549 m)     Head Circumference --      Peak Flow --      Pain Score 02/23/24 1240 0     Pain Loc --      Pain Education --      Exclude from Growth Chart --     Most recent vital signs: Vitals:   02/23/24 1811 02/23/24 1945  BP: (!) 165/83 (!) 166/84  Pulse: 65 63  Resp: 17 17  Temp: 97.6 F (36.4 C) 97.7 F (36.5 C)  SpO2: 100% 100%     General: Awake, interactive  CV:  Regular rate, good peripheral perfusion.  Resp:  Unlabored respirations. Abd:  Nondistended, soft, nontender Neuro:  Symmetric facial movement, fluid speech Skin:  Extensive area of erythema extending over almost the entirety of the left lower extremity.  There is a skin ulcer over the distal left leg without active drainage.  Intact DP  pulses bilaterally confirmed via Doppler.  See image below.  Erythema does not extend into the groin.        ED Results / Procedures / Treatments   Labs (all labs ordered are listed, but only abnormal results are displayed) Labs Reviewed  CBC - Abnormal; Notable for the following components:      Result Value   WBC 11.2 (*)    RBC 3.84 (*)    Hemoglobin 11.3 (*)    HCT 34.6 (*)    All other components within normal limits  COMPREHENSIVE METABOLIC PANEL WITH GFR - Abnormal; Notable for the following components:   Potassium 3.2 (*)    CO2 18 (*)    BUN 36 (*)    Creatinine, Ser 2.01 (*)    Albumin 3.4 (*)    AST 44 (*)    ALT 45 (*)    GFR, Estimated 37 (*)    All other components within normal limits  CULTURE, BLOOD (SINGLE)  LACTIC ACID, PLASMA     EKG EKG independently reviewed interpreted by myself (ER  attending) demonstrates:    RADIOLOGY Imaging independently reviewed and interpreted by myself demonstrates:  Ultrasound without evidence of DVT  Formal Radiology Read:  US  Venous Img Lower  Left (DVT Study) Result Date: 02/23/2024 CLINICAL DATA:  Left leg swelling EXAM: LEFT LOWER EXTREMITY VENOUS DOPPLER ULTRASOUND TECHNIQUE: Gray-scale sonography with graded compression, as well as color Doppler and duplex ultrasound were performed to evaluate the lower extremity deep venous systems from the level of the common femoral vein and including the common femoral, femoral, profunda femoral, popliteal and calf veins including the posterior tibial, peroneal and gastrocnemius veins when visible. The superficial great saphenous vein was also interrogated. Spectral Doppler was utilized to evaluate flow at rest and with distal augmentation maneuvers in the common femoral, femoral and popliteal veins. COMPARISON:  None Available. FINDINGS: Contralateral Common Femoral Vein: Respiratory phasicity is normal and symmetric with the symptomatic side. No evidence of thrombus. Normal  compressibility. Common Femoral Vein: No evidence of thrombus. Normal compressibility, respiratory phasicity and response to augmentation. Saphenofemoral Junction: No evidence of thrombus. Normal compressibility and flow on color Doppler imaging. Profunda Femoral Vein: No evidence of thrombus. Normal compressibility and flow on color Doppler imaging. Femoral Vein: No evidence of thrombus. Normal compressibility, respiratory phasicity and response to augmentation. Popliteal Vein: No evidence of thrombus. Normal compressibility, respiratory phasicity and response to augmentation. Calf Veins: No evidence of thrombus. Normal compressibility and flow on color Doppler imaging. Superficial Great Saphenous Vein: No evidence of thrombus. Normal compressibility. Venous Reflux:  None. Other Findings: Prominent left inguinal lymph node with normal fatty hilus. This likely reactive in nature. IMPRESSION: No evidence of deep venous thrombosis. Electronically Signed   By: Violeta Grey M.D.   On: 02/23/2024 22:56    PROCEDURES:  Critical Care performed: No  Procedures   MEDICATIONS ORDERED IN ED: Medications  cefTRIAXone (ROCEPHIN) 1 g in sodium chloride  0.9 % 100 mL IVPB (1 g Intravenous New Bag/Given 02/23/24 2322)  sodium chloride  0.9 % bolus 1,000 mL (0 mLs Intravenous Stopped 02/23/24 2325)     IMPRESSION / MDM / ASSESSMENT AND PLAN / ED COURSE  I reviewed the triage vital signs and the nursing notes.  Differential diagnosis includes, but is not limited to, cellulitis, no evidence of Fournier's gangrene, ulcer appears chronic, consideration for superimposed infection.  Patient's presentation is most consistent with acute presentation with potential threat to life or bodily function.  58 year old male presenting with extensive lower extremity cellulitis.  Reassuring vitals on presentation.  Labs with elevated creatinine from prior though no recent labs for several years.  Normal lactate.  DVT ultrasound  without clot.  He has an extensive area of erythema on exam for which I did recommend admission for IV antibiotics.  Patient initially was not agreeable with admission and was ordered for first dose of IV antibiotics in the ER, but on reevaluation is agreeable with plan for admission. Will reach out to hospitalist team.     FINAL CLINICAL IMPRESSION(S) / ED DIAGNOSES   Final diagnoses:  Cellulitis of left lower extremity     Rx / DC Orders   ED Discharge Orders     None        Note:  This document was prepared using Dragon voice recognition software and may include unintentional dictation errors.   Claria Crofts, MD 02/23/24 [phone]

## 2024-02-23 NOTE — ED Triage Notes (Signed)
 Pt here c/o sepsis. Pt went to his primary who told him to come to the ED about his left leg swelling and his hypertension. Pt state his leg is more itchy then painful. Pt ambulating with cane.

## 2024-02-24 DIAGNOSIS — Z87891 Personal history of nicotine dependence: Secondary | ICD-10-CM | POA: Diagnosis not present

## 2024-02-24 DIAGNOSIS — I739 Peripheral vascular disease, unspecified: Secondary | ICD-10-CM | POA: Diagnosis not present

## 2024-02-24 DIAGNOSIS — Z79899 Other long term (current) drug therapy: Secondary | ICD-10-CM | POA: Diagnosis not present

## 2024-02-24 DIAGNOSIS — Z884 Allergy status to anesthetic agent status: Secondary | ICD-10-CM | POA: Diagnosis not present

## 2024-02-24 DIAGNOSIS — L03116 Cellulitis of left lower limb: Principal | ICD-10-CM

## 2024-02-24 DIAGNOSIS — W57XXXA Bitten or stung by nonvenomous insect and other nonvenomous arthropods, initial encounter: Secondary | ICD-10-CM | POA: Diagnosis not present

## 2024-02-24 DIAGNOSIS — I10 Essential (primary) hypertension: Secondary | ICD-10-CM

## 2024-02-24 DIAGNOSIS — Z66 Do not resuscitate: Secondary | ICD-10-CM | POA: Diagnosis not present

## 2024-02-24 DIAGNOSIS — N179 Acute kidney failure, unspecified: Secondary | ICD-10-CM | POA: Diagnosis not present

## 2024-02-24 DIAGNOSIS — M7989 Other specified soft tissue disorders: Secondary | ICD-10-CM | POA: Diagnosis not present

## 2024-02-24 DIAGNOSIS — Z8619 Personal history of other infectious and parasitic diseases: Secondary | ICD-10-CM | POA: Diagnosis not present

## 2024-02-24 DIAGNOSIS — Z7952 Long term (current) use of systemic steroids: Secondary | ICD-10-CM | POA: Diagnosis not present

## 2024-02-24 DIAGNOSIS — Z5982 Transportation insecurity: Secondary | ICD-10-CM | POA: Diagnosis not present

## 2024-02-24 DIAGNOSIS — D649 Anemia, unspecified: Secondary | ICD-10-CM | POA: Diagnosis not present

## 2024-02-24 DIAGNOSIS — J45909 Unspecified asthma, uncomplicated: Secondary | ICD-10-CM | POA: Diagnosis not present

## 2024-02-24 DIAGNOSIS — Z888 Allergy status to other drugs, medicaments and biological substances status: Secondary | ICD-10-CM | POA: Diagnosis not present

## 2024-02-24 DIAGNOSIS — Z604 Social exclusion and rejection: Secondary | ICD-10-CM | POA: Diagnosis not present

## 2024-02-24 DIAGNOSIS — Z791 Long term (current) use of non-steroidal anti-inflammatories (NSAID): Secondary | ICD-10-CM | POA: Diagnosis not present

## 2024-02-24 DIAGNOSIS — R350 Frequency of micturition: Secondary | ICD-10-CM | POA: Diagnosis not present

## 2024-02-24 DIAGNOSIS — E66812 Obesity, class 2: Secondary | ICD-10-CM | POA: Diagnosis not present

## 2024-02-24 DIAGNOSIS — E876 Hypokalemia: Secondary | ICD-10-CM | POA: Diagnosis not present

## 2024-02-24 DIAGNOSIS — Z6837 Body mass index (BMI) 37.0-37.9, adult: Secondary | ICD-10-CM | POA: Diagnosis not present

## 2024-02-24 DIAGNOSIS — I890 Lymphedema, not elsewhere classified: Secondary | ICD-10-CM | POA: Diagnosis not present

## 2024-02-24 LAB — BASIC METABOLIC PANEL WITH GFR
Anion gap: 10 (ref 5–15)
BUN: 38 mg/dL — ABNORMAL HIGH (ref 6–20)
CO2: 22 mmol/L (ref 22–32)
Calcium: 8.5 mg/dL — ABNORMAL LOW (ref 8.9–10.3)
Chloride: 105 mmol/L (ref 98–111)
Creatinine, Ser: 1.94 mg/dL — ABNORMAL HIGH (ref 0.61–1.24)
GFR, Estimated: 39 mL/min — ABNORMAL LOW (ref >60)
Glucose, Bld: 139 mg/dL — ABNORMAL HIGH (ref 70–99)
Potassium: 2.5 mmol/L — CL (ref 3.5–5.1)
Sodium: 137 mmol/L (ref 135–145)

## 2024-02-24 LAB — CBC
HCT: 33 % — ABNORMAL LOW (ref 39.0–52.0)
Hemoglobin: 10.8 g/dL — ABNORMAL LOW (ref 13.0–17.0)
MCH: 29.1 pg (ref 26.0–34.0)
MCHC: 32.7 g/dL (ref 30.0–36.0)
MCV: 88.9 fL (ref 80.0–100.0)
Platelets: 161 10*3/uL (ref 150–400)
RBC: 3.71 MIL/uL — ABNORMAL LOW (ref 4.22–5.81)
RDW: 12.9 % (ref 11.5–15.5)
WBC: 8.1 10*3/uL (ref 4.0–10.5)
nRBC: 0 % (ref 0.0–0.2)

## 2024-02-24 LAB — HIV ANTIBODY (ROUTINE TESTING W REFLEX): HIV Screen 4th Generation wRfx: NONREACTIVE

## 2024-02-24 LAB — MAGNESIUM: Magnesium: 2.3 mg/dL (ref 1.7–2.4)

## 2024-02-24 LAB — C DIFFICILE QUICK SCREEN W PCR REFLEX
C Diff antigen: NEGATIVE
C Diff interpretation: NOT DETECTED
C Diff toxin: NEGATIVE

## 2024-02-24 LAB — POTASSIUM: Potassium: 3.8 mmol/L (ref 3.5–5.1)

## 2024-02-24 MED ORDER — POTASSIUM CHLORIDE CRYS ER 20 MEQ PO TBCR
20.0000 meq | EXTENDED_RELEASE_TABLET | Freq: Once | ORAL | Status: AC
  Administered 2024-02-24: 20 meq via ORAL
  Filled 2024-02-24: qty 1

## 2024-02-24 MED ORDER — VITAMIN D (ERGOCALCIFEROL) 1.25 MG (50000 UNIT) PO CAPS: 50000.0000 [IU] | ORAL_CAPSULE | ORAL | Status: DC

## 2024-02-24 MED ORDER — ENOXAPARIN SODIUM 60 MG/0.6ML IJ SOSY
0.5000 mg/kg | PREFILLED_SYRINGE | INTRAMUSCULAR | Status: DC
  Administered 2024-02-24 – 2024-02-25 (×2): 45 mg via SUBCUTANEOUS
  Filled 2024-02-24 (×2): qty 0.6

## 2024-02-24 MED ORDER — ACETAMINOPHEN 650 MG RE SUPP: 650.0000 mg | Freq: Four times a day (QID) | RECTAL | Status: DC | PRN

## 2024-02-24 MED ORDER — MAGNESIUM HYDROXIDE 400 MG/5ML PO SUSP: 30.0000 mL | Freq: Every day | ORAL | Status: DC | PRN

## 2024-02-24 MED ORDER — ONDANSETRON HCL 4 MG PO TABS: 4.0000 mg | ORAL_TABLET | Freq: Four times a day (QID) | ORAL | Status: DC | PRN

## 2024-02-24 MED ORDER — SODIUM CHLORIDE 0.9% FLUSH
3.0000 mL | Freq: Two times a day (BID) | INTRAVENOUS | Status: DC
  Administered 2024-02-24 – 2024-02-25 (×3): 3 mL via INTRAVENOUS

## 2024-02-24 MED ORDER — ONDANSETRON HCL 4 MG/2ML IJ SOLN: 4.0000 mg | Freq: Four times a day (QID) | INTRAMUSCULAR | Status: DC | PRN

## 2024-02-24 MED ORDER — SODIUM CHLORIDE 0.9 % IV SOLN
250.0000 mL | INTRAVENOUS | Status: AC | PRN
  Administered 2024-02-24: 250 mL via INTRAVENOUS

## 2024-02-24 MED ORDER — SODIUM CHLORIDE 0.9% FLUSH: 3.0000 mL | INTRAVENOUS | Status: DC | PRN

## 2024-02-24 MED ORDER — POTASSIUM CHLORIDE 10 MEQ/100ML IV SOLN
10.0000 meq | INTRAVENOUS | Status: DC
  Administered 2024-02-24: 10 meq via INTRAVENOUS
  Filled 2024-02-24: qty 100

## 2024-02-24 MED ORDER — VANCOMYCIN HCL IN DEXTROSE 1-5 GM/200ML-% IV SOLN: 1000.0000 mg | INTRAVENOUS | Status: DC

## 2024-02-24 MED ORDER — TRAZODONE HCL 50 MG PO TABS: 25.0000 mg | ORAL_TABLET | Freq: Every evening | ORAL | Status: DC | PRN

## 2024-02-24 MED ORDER — POTASSIUM CHLORIDE CRYS ER 20 MEQ PO TBCR
40.0000 meq | EXTENDED_RELEASE_TABLET | Freq: Once | ORAL | Status: AC
  Administered 2024-02-24: 40 meq via ORAL
  Filled 2024-02-24: qty 2

## 2024-02-24 MED ORDER — POTASSIUM CHLORIDE 10 MEQ/100ML IV SOLN
10.0000 meq | INTRAVENOUS | Status: DC
Start: 2024-02-24 — End: 2024-02-24

## 2024-02-24 MED ORDER — CARVEDILOL 3.125 MG PO TABS
6.2500 mg | ORAL_TABLET | Freq: Two times a day (BID) | ORAL | Status: DC
  Administered 2024-02-24: 6.25 mg via ORAL
  Filled 2024-02-24 (×2): qty 2

## 2024-02-24 MED ORDER — ACETAMINOPHEN 325 MG PO TABS: 650.0000 mg | ORAL_TABLET | Freq: Four times a day (QID) | ORAL | Status: DC | PRN

## 2024-02-24 MED ORDER — MORPHINE SULFATE (PF) 2 MG/ML IV SOLN: 2.0000 mg | INTRAVENOUS | Status: DC | PRN

## 2024-02-24 MED ORDER — AMLODIPINE BESYLATE 5 MG PO TABS
5.0000 mg | ORAL_TABLET | Freq: Every day | ORAL | Status: DC
  Administered 2024-02-24 – 2024-02-25 (×2): 5 mg via ORAL
  Filled 2024-02-24 (×2): qty 1

## 2024-02-24 MED ORDER — CEFAZOLIN SODIUM-DEXTROSE 2-4 GM/100ML-% IV SOLN
2.0000 g | Freq: Three times a day (TID) | INTRAVENOUS | Status: DC
  Administered 2024-02-24: 2 g via INTRAVENOUS
  Filled 2024-02-24 (×3): qty 100

## 2024-02-24 MED ORDER — MEDIHONEY WOUND/BURN DRESSING EX PSTE
1.0000 | PASTE | Freq: Every day | CUTANEOUS | Status: DC
  Administered 2024-02-24 – 2024-02-25 (×2): 1 via TOPICAL
  Filled 2024-02-24: qty 44

## 2024-02-24 MED ORDER — VANCOMYCIN HCL 2000 MG/400ML IV SOLN
2000.0000 mg | Freq: Once | INTRAVENOUS | Status: AC
  Administered 2024-02-24: 2000 mg via INTRAVENOUS
  Filled 2024-02-24: qty 400

## 2024-02-24 NOTE — Consult Note (Signed)
 Hospital Consult    Reason for Consult:  Left Lower Extremity non healing wound.  Requesting Physician:  Dr Rito Chess MD  MRN #:  [id number]  History of Present Illness: This is a 58 y.o. male male with medical history significant for asthma and chickenpox, who presented to the emergency room with acute onset of left lower extremity swelling with erythema, warmth and pain over the last 3 days.  He has an ulcer on her left leg which has not significantly changed lately.  He denies any purulent drainage or bleeding from her ulcer.  He has not seen a physician in several years.  No fever or chills.  He has not history of nausea and vomiting without diarrhea.  He admits to occasional urinary frequency without urgency or dysuria or hematuria or flank pain.  No chest pain or palpitations. Vascular Surgery was consulted to evaluate.   Past Medical History:  Diagnosis Date   Asthma    Chickenpox     No past surgical history on file.  Allergies  Allergen Reactions   Novocain [Procaine] Other (See Comments)    Panic attack   Cortisone Rash   Hctz [Hydrochlorothiazide ] Rash    Prior to Admission medications   Medication Sig Start Date End Date Taking? Authorizing Provider  acetaminophen  (TYLENOL ) 500 MG tablet Take 1,000 mg by mouth every 6 (six) hours as needed.   Yes [provider]  IBUPROFEN PO Take by mouth in the morning, at noon, and at bedtime.   Yes [provider]  amLODipine  (NORVASC ) 5 MG tablet Take 1 tablet (5 mg total) by mouth daily. Patient not taking: Reported on 02/24/2024 01/10/20   McLean-Scocuzza, Karon Packer, MD  amLODIPine  Besylate (NORVASC  PO)     [provider]  carvedilol  (COREG ) 6.25 MG tablet Take 1 tablet (6.25 mg total) by mouth 2 (two) times daily with a meal. Patient not taking: Reported on 02/24/2024 02/17/20   Kent Pear, MD  Cholecalciferol  1.25 MG (50000 UT) capsule Take 1 capsule (50,000 Units total) by mouth once a  week. Patient not taking: Reported on 02/24/2024 01/11/20   McLean-Scocuzza, Karon Packer, MD  Cyclobenzaprine  HCl (CYCLOBENZAPRINE  20 TD)     [provider]  predniSONE  (DELTASONE ) 1 MG tablet     [provider]    Social History   Socioeconomic History   Marital status: Single    Spouse name: Not on file   Number of children: Not on file   Years of education: Not on file   Highest education level: Not on file  Occupational History   Not on file  Tobacco Use   Smoking status: Former    Current packs/day: 0.00    Types: Cigarettes    Quit date: 02/08/2007    Years since quitting: 17.0   Smokeless tobacco: Never  Substance and Sexual Activity   Alcohol use: No    Alcohol/week: 0.0 standard drinks of alcohol   Drug use: No   Sexual activity: Not on file  Other Topics Concern   Not on file  Social History Narrative   Not on file   Social Drivers of Health   Financial Resource Strain: Not on file  Food Insecurity: No Food Insecurity (02/24/2024)   Hunger Vital Sign    Worried About Running Out of Food in the Last Year: Never true    Ran Out of Food in the Last Year: Never true  Transportation Needs: Unmet Transportation Needs (02/24/2024)   PRAPARE -  Administrator, Civil Service (Medical): Yes    Lack of Transportation (Non-Medical): No  Physical Activity: Not on file  Stress: Not on file  Social Connections: Socially Isolated (02/24/2024)   Social Connection and Isolation Panel [NHANES]    Frequency of Communication with Friends and Family: Once a week    Frequency of Social Gatherings with Friends and Family: Once a week    Attends Religious Services: Never    Database administrator or Organizations: No    Attends Banker Meetings: Never    Marital Status: Never married  Intimate Partner Violence: Not At Risk (02/24/2024)   Humiliation, Afraid, Rape, and Kick questionnaire    Fear of Current or Ex-Partner: No    Emotionally Abused: No     Physically Abused: No    Sexually Abused: No     Family History  Problem Relation Age of Onset   Breast cancer Mother     ROS: Otherwise negative unless mentioned in HPI  Physical Examination  Vitals:   02/24/24 0818 02/24/24 1546  BP: (!) 144/79 120/63  Pulse: 70 62  Resp: 15 17  Temp: (!) 97.3 F (36.3 C) 98.2 F (36.8 C)  SpO2: 98% 100%   Body mass index is 37.66 kg/m.  General:  WDWN in NAD Gait: Not observed HENT: WNL, normocephalic Pulmonary: normal non-labored breathing, without Rales, rhonchi,  wheezing Cardiac: regular, without  Murmurs, rubs or gallops; without carotid bruits Abdomen: Positive bowel sounds throughout, soft, NT/ND, no masses Skin: without rashes Vascular Exam/Pulses: Bilateral lower extremities with lymphedema left lower extremity with lymphedema vascular insufficiency and cellulitis.  Also noted wound to the left shin.  Unable to palpate pulses in bilateral lower extremities due to lymphedema and swelling. Extremities: without ischemic changes, without Gangrene , with cellulitis; with open wounds; patient endorses open wound has been there consistently for the last 5 years Musculoskeletal: no muscle wasting or atrophy  Neurologic: A&O X 3;  No focal weakness or paresthesias are detected; speech is fluent/normal Psychiatric:  The pt has Abnormal- flat affect. Lymph:  Unremarkable  CBC    Component Value Date/Time   WBC 8.1 02/24/2024 0606   RBC 3.71 (L) 02/24/2024 0606   HGB 10.8 (L) 02/24/2024 0606   HCT 33.0 (L) 02/24/2024 0606   PLT 161 02/24/2024 0606   MCV 88.9 02/24/2024 0606   MCH 29.1 02/24/2024 0606   MCHC 32.7 02/24/2024 0606   RDW 12.9 02/24/2024 0606   LYMPHSABS 1.4 01/10/2020 1632   MONOABS 0.6 01/10/2020 1632   EOSABS 0.4 01/10/2020 1632   BASOSABS 0.1 01/10/2020 1632    BMET    Component Value Date/Time   NA 137 02/24/2024 0606   K 3.8 02/24/2024 1353   CL 105 02/24/2024 0606   CO2 22 02/24/2024 0606    GLUCOSE 139 (H) 02/24/2024 0606   BUN 38 (H) 02/24/2024 0606   CREATININE 1.94 (H) 02/24/2024 0606   CALCIUM 8.5 (L) 02/24/2024 0606   GFRNONAA 39 (L) 02/24/2024 0606   GFRAA >60 07/23/2018 1801    COAGS: No results found for: "INR", "PROTIME"   Non-Invasive Vascular Imaging:   EXAM: LEFT LOWER EXTREMITY VENOUS DOPPLER ULTRASOUND   TECHNIQUE: Gray-scale sonography with graded compression, as well as color Doppler and duplex ultrasound were performed to evaluate the lower extremity deep venous systems from the level of the common femoral vein and including the common femoral, femoral, profunda femoral, popliteal and calf veins including the posterior tibial,  peroneal and gastrocnemius veins when visible. The superficial great saphenous vein was also interrogated. Spectral Doppler was utilized to evaluate flow at rest and with distal augmentation maneuvers in the common femoral, femoral and popliteal veins.   COMPARISON:  None Available.   FINDINGS: Contralateral Common Femoral Vein: Respiratory phasicity is normal and symmetric with the symptomatic side. No evidence of thrombus. Normal compressibility.   Common Femoral Vein: No evidence of thrombus. Normal compressibility, respiratory phasicity and response to augmentation.   Saphenofemoral Junction: No evidence of thrombus. Normal compressibility and flow on color Doppler imaging.   Profunda Femoral Vein: No evidence of thrombus. Normal compressibility and flow on color Doppler imaging.   Femoral Vein: No evidence of thrombus. Normal compressibility, respiratory phasicity and response to augmentation.   Popliteal Vein: No evidence of thrombus. Normal compressibility, respiratory phasicity and response to augmentation.   Calf Veins: No evidence of thrombus. Normal compressibility and flow on color Doppler imaging.   Superficial Great Saphenous Vein: No evidence of thrombus. Normal compressibility.   Venous  Reflux:  None.   Other Findings: Prominent left inguinal lymph node with normal fatty hilus. This likely reactive in nature.   IMPRESSION: No evidence of deep venous thrombosis.  Statin:  No. Beta Blocker:  Yes.   Aspirin:  No. ACEI:  No. ARB:  No. CCB use:  Yes Other antiplatelets/anticoagulants:  No.    ASSESSMENT/PLAN: This is a 58 y.o. male who presents to [facility] emergency department with left lower extremity pain and swelling with cellulitis.  Patient underwent a vascular ultrasound of lower extremities which showed no DVTs.  Patient has a wound/ulcer to his left shin which he endorses has been there for the last 5 years questionably healing.  On physical exam patient is noted to have bilateral lower extremity vascular insufficiency with lymphedema.  With this longstanding wound/ulcer to his left lower extremity vascular surgery recommended doing a left lower extremity angiogram with possible intervention.  Patient refuses at this time.  I also offered Unna boot wraps for his bilateral lower extremity lymphedema patient again refuses at this time.  Does not want any procedure or intervention to his legs.  Patient is agreeable with antibiotics only at this time.  Hospitalist Dr. Brenna Cam was made aware of the patient's decision.   -I discussed the case with Dr. Devon Fogo MD and he agrees with the plan   Annamaria Barrette [facility] 02/24/2024 4:07 PM

## 2024-02-24 NOTE — Assessment & Plan Note (Signed)
-   Will replace potassium and check magnesium level.

## 2024-02-24 NOTE — Assessment & Plan Note (Signed)
Will continue antihypertensive therapy.

## 2024-02-24 NOTE — Final Progress Note (Signed)
 Same-day rounding progress note  Patient seen and examined.  Please see Dr. Abbey Abbe dictated history and physical for further details.  I agree with his assessment and plan.  Left lower extremity cellulitis Started after spider bite Chronic history of lymphedema and lower extremity swelling. Poor compliance with outpatient treatment.  She has not seen her doctors and or wound care providers Discussed with vascular surgery who evaluated her and patient refused any kind of intervention/treatment Can you antibiotics for now  Time spent: 25 mins

## 2024-02-24 NOTE — Assessment & Plan Note (Signed)
-   The patient will be admitted to a medical/surgical bed. - Will continue antibiotic therapy with IV Rocephin. - Given the diffuse nature of the cellulitis I will add IV vancomycin . - Warm compresses will be utilized. - Pain management will be provided.

## 2024-02-24 NOTE — ED Notes (Signed)
 Pt's shirt, shoes, pants, boxers, baseball cap, glasses, cane, phone & phone charger all taken to room 154 with pt.

## 2024-02-24 NOTE — Progress Notes (Signed)
 Pharmacy Antibiotic Note  Johnathan Scott. is a 58 y.o. male admitted on 02/23/2024 with cellulitis.  Patient presented to ED with acute onset of LLE swelling with erythema, warmth, and pain over the past 3 days. Denies any purulent drainage or bleeding from ulcer. Pharmacy has been consulted for vancomycin  dosing.  Plan: Give vancomycin  2000 mg IV x1 followed by 1000 mg IV Q36H. Goal AUC 400-550. Expected AUC: 504 Expected Css min: 12.2 SCr used: 1.94  Weight used: IBW, Vd used: 0.5 (BMI 37.66) Continue to monitor renal function and follow culture results   Height: 5\' 1"  (154.9 cm) Weight: 90.4 kg (199 lb 4.7 oz) IBW/kg (Calculated) : 52.3  Temp (24hrs), Avg:97.8 F (36.6 C), Min:97.6 F (36.4 C), Max:98 F (36.7 C)  Recent Labs  Lab 02/23/24 1308 02/24/24 0606  WBC 11.2* 8.1  CREATININE 2.01* 1.94*  LATICACIDVEN 1.0  --     Estimated Creatinine Clearance: 38.7 mL/min (A) (by C-G formula based on SCr of 1.94 mg/dL (H)).    Allergies  Allergen Reactions   Novocain [Procaine] Other (See Comments)    Panic attack   Cortisone Rash   Hctz [Hydrochlorothiazide ] Rash    Antimicrobials this admission: 5/6 Ceftriaxone x 1 5/7 Cefazolin x 1 5/7 Vanc>>   Microbiology results: 5/6 BCx: IP   Thank you for allowing pharmacy to be a part of this patient's care.  Alice Innocent, PharmD, BCPS 02/24/2024 7:46 AM

## 2024-02-24 NOTE — Consult Note (Signed)
 WOC Nurse Consult Note: patient with longstanding history of lymphedema and venous insufficiency, previous followed by vascular with last visit 2023 Reason for Consult: L LE wound present for years  Wound type: full thickness likely r/t venous insufficiency  Pressure Injury POA: NA  Measurement: see nursing flowsheet  Wound bed: 100% brown red necrotic  Drainage (amount, consistency, odor) foul smelling per H&P  Periwound: edema and erythema  Dressing procedure/placement/frequency: Cleanse LLE wound with Vashe wound cleanser Timm Foot [phone]) do not rinse and allow to air dry. Apply Medihoney to wound bed daily, fill in wound depth with dry gauze and cover with silicone foam or ABD pad and Kerlix whichever is preferred.   POC discussed with bedside nurse. Patient should resume follow up with vascular surgeon or wound care center for ongoing management of wound and edema.   Thank you,    Ronni Colace MSN, RN-BC, [employer] [phone]

## 2024-02-24 NOTE — Progress Notes (Signed)
 PHARMACY CONSULT NOTE - ELECTROLYTES  Pharmacy Consult for Electrolyte Monitoring and Replacement   Recent Labs: Height: 5\' 1"  (154.9 cm) Weight: 90.4 kg (199 lb 4.7 oz) IBW/kg (Calculated) : 52.3 Estimated Creatinine Clearance: 38.7 mL/min (A) (by C-G formula based on SCr of 1.94 mg/dL (H)). Potassium (mmol/L)  Date Value  02/24/2024 2.5 (LL)   Magnesium (mg/dL)  Date Value  54/06/8118 2.3   Calcium (mg/dL)  Date Value  14/78/2956 8.5 (L)   Albumin (g/dL)  Date Value  21/30/8657 3.4 (L)   Sodium (mmol/L)  Date Value  02/24/2024 137    Assessment  Johnathan Scott. is a 58 y.o. male presenting with cellulitis. PMH significant for HTN, asthma, and chickenpox. Pharmacy has been consulted to monitor and replace electrolytes.  Diet: PO, thin MIVF: N/A Pertinent medications: N/A  Goal of Therapy: Electrolytes WNL  Plan:  K 2.5, MD ordered a total of KCL 70 mEq, no further supplementation needed Will order a follow-up K level 2 hours after last KCL dose  Will get updated Phos level with AM labs  Thank you for allowing pharmacy to be a part of this patient's care.  Alice Innocent, PharmD Clinical Pharmacist 02/24/2024 9:50 AM

## 2024-02-24 NOTE — H&P (Signed)
 [facility]   PATIENT NAME: Johnathan Scott    MR#:  [id number]  DATE OF BIRTH:  Jan 23, 1966  DATE OF ADMISSION:  02/23/2024  PRIMARY CARE PHYSICIAN: System, Provider Not In   Patient is coming from: Home  REQUESTING/REFERRING PHYSICIAN: Claria Crofts, MD   CHIEF COMPLAINT:   Chief Complaint  Patient presents with   Blood Infection    HISTORY OF PRESENT ILLNESS:  Johnathan Scott. is a 58 y.o.  male with medical history significant for asthma and chickenpox, who presented to the emergency room with acute onset of left lower extremity swelling with erythema, warmth and pain over the last 3 days.  He has an ulcer on her left leg which has not significantly changed lately.  He denies any purulent drainage or bleeding from her ulcer.  He has not seen a physician in several years.  No fever or chills.  He has not history of nausea and vomiting without diarrhea.  He admits to occasional urinary frequency without urgency or dysuria or hematuria or flank pain.  No chest pain or palpitations.  ED Course: When she came to the ER, BP was 162/89 with otherwise normal vital signs.  Labs revealed hypokalemia 3.2 and CO2 of 18 with a BUN of 36 and creatinine 2.01 previously normal with AST 44 and ALT 45.  Lactic acid was 1 and CBC showed leukocytosis of 11.2 with mild anemia.  Blood culture was drawn.   EKG as reviewed by me : None Imaging:  Venous Doppler of the left lower extremity showed no evidence for DVT.  The patient was given a gram of IV Rocephin and 1 L bolus of IV normal saline.  He will be admitted to a medical bed for further evaluation and management. PAST MEDICAL HISTORY:   Past Medical History:  Diagnosis Date   Asthma    Chickenpox     PAST SURGICAL HISTORY:  No past surgical history on file.  No reported previous surgeries.  SOCIAL HISTORY:   Social History   Tobacco Use   Smoking status: Former    Current packs/day: 0.00    Types: Cigarettes    Quit date:  02/08/2007    Years since quitting: 17.0   Smokeless tobacco: Never  Substance Use Topics   Alcohol use: No    Alcohol/week: 0.0 standard drinks of alcohol    FAMILY HISTORY:   Family History  Problem Relation Age of Onset   Breast cancer Mother     DRUG ALLERGIES:   Allergies  Allergen Reactions   Novocain [Procaine] Other (See Comments)    Panic attack   Cortisone Rash   Hctz [Hydrochlorothiazide ] Rash    REVIEW OF SYSTEMS:   ROS As per history of present illness. All pertinent systems were reviewed above. Constitutional, HEENT, cardiovascular, respiratory, GI, GU, musculoskeletal, neuro, psychiatric, endocrine, integumentary and hematologic systems were reviewed and are otherwise negative/unremarkable except for positive findings mentioned above in the HPI.   MEDICATIONS AT HOME:   Prior to Admission medications   Medication Sig Start Date End Date Taking? Authorizing Provider  acetaminophen  (TYLENOL ) 500 MG tablet Take 1,000 mg by mouth every 6 (six) hours as needed.    [provider]  amLODipine  (NORVASC ) 5 MG tablet Take 1 tablet (5 mg total) by mouth daily. 01/10/20   McLean-Scocuzza, Karon Packer, MD  amLODIPine  Besylate (NORVASC  PO)     [provider]  carvedilol  (COREG ) 6.25 MG tablet Take 1 tablet (6.25 mg  total) by mouth 2 (two) times daily with a meal. 02/17/20   Kent Pear, MD  Cholecalciferol  1.25 MG (50000 UT) capsule Take 1 capsule (50,000 Units total) by mouth once a week. 01/11/20   McLean-Scocuzza, Karon Packer, MD  Cyclobenzaprine  HCl (CYCLOBENZAPRINE  20 TD)     [provider]  IBUPROFEN PO Take by mouth in the morning, at noon, and at bedtime.    [provider]  predniSONE  (DELTASONE ) 1 MG tablet     [provider]      VITAL SIGNS:  Blood pressure (!) 140/74, pulse 72, temperature 98 F (36.7 C), resp. rate 19, height 5\' 1"  (1.549 m), weight 90.4 kg, SpO2 97%.  PHYSICAL EXAMINATION:  Physical  Exam  GENERAL:  58 y.o.-year-old male patient lying in the bed with no acute distress.  EYES: Pupils equal, round, reactive to light and accommodation. No scleral icterus. Extraocular muscles intact.  HEENT: Head atraumatic, normocephalic. Oropharynx and nasopharynx clear.  NECK:  Supple, no jugular venous distention. No thyroid  enlargement, no tenderness.  LUNGS: Normal breath sounds bilaterally, no wheezing, rales,rhonchi or crepitation. No use of accessory muscles of respiration.  CARDIOVASCULAR: Regular rate and rhythm, S1, S2 normal. No murmurs, rubs, or gallops.  ABDOMEN: Soft, nondistended, nontender. Bowel sounds present. No organomegaly or mass.  EXTREMITIES: No pedal edema, cyanosis, or clubbing.  NEUROLOGIC: Cranial nerves II through XII are intact. Muscle strength 5/5 in all extremities. Sensation intact. Gait not checked.  PSYCHIATRIC: The patient is alert and oriented x 3.  Normal affect and good eye contact. SKIN: Diffuse left lower extremity erythema with warmth, induration and tenderness extending to his upper thigh where the left lower leg ulcer and dried bloody scab at the base without purulent drainage.   LABORATORY PANEL:   CBC Recent Labs  Lab 02/24/24 0606  WBC 8.1  HGB 10.8*  HCT 33.0*  PLT 161   ------------------------------------------------------------------------------------------------------------------  Chemistries  Recent Labs  Lab 02/23/24 1308 02/24/24 0606  NA 135 137  K 3.2* 2.5*  CL 104 105  CO2 18* 22  GLUCOSE 99 139*  BUN 36* 38*  CREATININE 2.01* 1.94*  CALCIUM 8.9 8.5*  AST 44*  --   ALT 45*  --   ALKPHOS 54  --   BILITOT 0.7  --    ------------------------------------------------------------------------------------------------------------------  Cardiac Enzymes No results for input(s): "TROPONINI" in the last 168  hours. ------------------------------------------------------------------------------------------------------------------  RADIOLOGY:  US  Venous Img Lower  Left (DVT Study) Result Date: 02/23/2024 CLINICAL DATA:  Left leg swelling EXAM: LEFT LOWER EXTREMITY VENOUS DOPPLER ULTRASOUND TECHNIQUE: Gray-scale sonography with graded compression, as well as color Doppler and duplex ultrasound were performed to evaluate the lower extremity deep venous systems from the level of the common femoral vein and including the common femoral, femoral, profunda femoral, popliteal and calf veins including the posterior tibial, peroneal and gastrocnemius veins when visible. The superficial great saphenous vein was also interrogated. Spectral Doppler was utilized to evaluate flow at rest and with distal augmentation maneuvers in the common femoral, femoral and popliteal veins. COMPARISON:  None Available. FINDINGS: Contralateral Common Femoral Vein: Respiratory phasicity is normal and symmetric with the symptomatic side. No evidence of thrombus. Normal compressibility. Common Femoral Vein: No evidence of thrombus. Normal compressibility, respiratory phasicity and response to augmentation. Saphenofemoral Junction: No evidence of thrombus. Normal compressibility and flow on color Doppler imaging. Profunda Femoral Vein: No evidence of thrombus. Normal compressibility and flow on color Doppler imaging. Femoral Vein: No evidence of thrombus.  Normal compressibility, respiratory phasicity and response to augmentation. Popliteal Vein: No evidence of thrombus. Normal compressibility, respiratory phasicity and response to augmentation. Calf Veins: No evidence of thrombus. Normal compressibility and flow on color Doppler imaging. Superficial Great Saphenous Vein: No evidence of thrombus. Normal compressibility. Venous Reflux:  None. Other Findings: Prominent left inguinal lymph node with normal fatty hilus. This likely reactive in nature.  IMPRESSION: No evidence of deep venous thrombosis. Electronically Signed   By: Violeta Grey M.D.   On: 02/23/2024 22:56      IMPRESSION AND PLAN:  Assessment and Plan: * Left leg cellulitis - The patient will be admitted to a medical/surgical bed. - Will continue antibiotic therapy with IV Rocephin. - Given the diffuse nature of the cellulitis I will add IV vancomycin . - Warm compresses will be utilized. - Pain management will be provided.  Hypokalemia - Will replace potassium and check magnesium level.  Essential hypertension - Will continue antihypertensive therapy.   DVT prophylaxis: Lovenox. Advanced Care Planning:  Code Status: The patient is DNR and DNI.  This was discussed with him. Family Communication:  The plan of care was discussed in details with the patient (and family). I answered all questions. The patient agreed to proceed with the above mentioned plan. Further management will depend upon hospital course. Disposition Plan: Back to previous home environment Consults called: none. All the records are reviewed and case discussed with ED provider.  Status is: Inpatient   At the time of the admission, it appears that the appropriate admission status for this patient is inpatient.  This is judged to be reasonable and necessary in order to provide the required intensity of service to ensure the patient's safety given the presenting symptoms, physical exam findings and initial radiographic and laboratory data in the context of comorbid conditions.  The patient requires inpatient status due to high intensity of service, high risk of further deterioration and high frequency of surveillance required.  I certify that at the time of admission, it is my clinical judgment that the patient will require inpatient hospital care extending more than 2 midnights.                            Dispo: The patient is from: Home              Anticipated d/c is to: Home              Patient  currently is not medically stable to d/c.              Difficult to place patient: No  Virgene Griffin M.D on 02/24/2024 at 7:32 AM  [facility]   From 7 PM-7 AM, contact night-coverage [email or web address]  CC: Primary care physician; System, Provider Not In

## 2024-02-24 NOTE — Progress Notes (Signed)
 Anticoagulation monitoring(Lovenox):  58 yo male ordered Lovenox 40 mg Q24h    Filed Weights   02/23/24 1241  Weight: 90.4 kg (199 lb 4.7 oz)   BMI 37.7    Lab Results  Component Value Date   CREATININE 2.01 (H) 02/23/2024   CREATININE 1.26 01/10/2020   CREATININE 1.37 12/27/2018   Estimated Creatinine Clearance: 37.3 mL/min (A) (by C-G formula based on SCr of 2.01 mg/dL (H)). Hemoglobin & Hematocrit     Component Value Date/Time   HGB 11.3 (L) 02/23/2024 1308   HCT 34.6 (L) 02/23/2024 1308     Per Protocol for Patient with estCrcl > 30 ml/min and BMI > 30, will transition to Lovenox 45 mg Q24h.

## 2024-02-25 ENCOUNTER — Other Ambulatory Visit: Payer: Self-pay

## 2024-02-25 LAB — RENAL FUNCTION PANEL
Albumin: 2.6 g/dL — ABNORMAL LOW (ref 3.5–5.0)
Anion gap: 9 (ref 5–15)
BUN: 37 mg/dL — ABNORMAL HIGH (ref 6–20)
CO2: 21 mmol/L — ABNORMAL LOW (ref 22–32)
Calcium: 8.7 mg/dL — ABNORMAL LOW (ref 8.9–10.3)
Chloride: 110 mmol/L (ref 98–111)
Creatinine, Ser: 1.61 mg/dL — ABNORMAL HIGH (ref 0.61–1.24)
GFR, Estimated: 49 mL/min — ABNORMAL LOW (ref >60)
Glucose, Bld: 114 mg/dL — ABNORMAL HIGH (ref 70–99)
Phosphorus: 3 mg/dL (ref 2.5–4.6)
Potassium: 3.5 mmol/L (ref 3.5–5.1)
Sodium: 140 mmol/L (ref 135–145)

## 2024-02-25 LAB — CBC
HCT: 31.1 % — ABNORMAL LOW (ref 39.0–52.0)
Hemoglobin: 10.1 g/dL — ABNORMAL LOW (ref 13.0–17.0)
MCH: 29.5 pg (ref 26.0–34.0)
MCHC: 32.5 g/dL (ref 30.0–36.0)
MCV: 90.9 fL (ref 80.0–100.0)
Platelets: 167 10*3/uL (ref 150–400)
RBC: 3.42 MIL/uL — ABNORMAL LOW (ref 4.22–5.81)
RDW: 13.2 % (ref 11.5–15.5)
WBC: 6.8 10*3/uL (ref 4.0–10.5)
nRBC: 0 % (ref 0.0–0.2)

## 2024-02-25 LAB — MAGNESIUM: Magnesium: 2.3 mg/dL (ref 1.7–2.4)

## 2024-02-25 MED ORDER — VANCOMYCIN HCL 750 MG/150ML IV SOLN
750.0000 mg | INTRAVENOUS | Status: DC
  Administered 2024-02-25: 750 mg via INTRAVENOUS
  Filled 2024-02-25: qty 150

## 2024-02-25 MED ORDER — CEPHALEXIN 500 MG PO CAPS
500.0000 mg | ORAL_CAPSULE | Freq: Three times a day (TID) | ORAL | 0 refills | Status: AC
  Filled 2024-02-25: qty 15, 5d supply, fill #0

## 2024-02-25 NOTE — Progress Notes (Signed)
 PHARMACY CONSULT NOTE - ELECTROLYTES  Pharmacy Consult for Electrolyte Monitoring and Replacement   Recent Labs: Height: 5\' 1"  (154.9 cm) Weight: 90.4 kg (199 lb 4.7 oz) IBW/kg (Calculated) : 52.3 Estimated Creatinine Clearance: 46.6 mL/min (A) (by C-G formula based on SCr of 1.61 mg/dL (H)). Potassium (mmol/L)  Date Value  02/25/2024 3.5   Magnesium (mg/dL)  Date Value  91/47/8295 2.3   Calcium (mg/dL)  Date Value  62/13/0865 8.7 (L)   Albumin (g/dL)  Date Value  78/46/9629 2.6 (L)   Phosphorus (mg/dL)  Date Value  52/84/1324 3.0   Sodium (mmol/L)  Date Value  02/25/2024 140    Assessment  Johnathan Scott. is a 58 y.o. male presenting with cellulitis. PMH significant for HTN, asthma, and chickenpox. Pharmacy has been consulted to monitor and replace electrolytes.  Diet: PO, thin MIVF: N/A Pertinent medications: N/A  Goal of Therapy: Electrolytes WNL  Plan:  No supplementation needed at this time Follow-up with AM labs  Thank you for allowing pharmacy to be a part of this patient's care.  Alice Innocent, PharmD Clinical Pharmacist 02/25/2024 7:20 AM

## 2024-02-25 NOTE — Progress Notes (Signed)
 Patient discharging, all discharge education and instruction provided. All questions answered and belongings return. IV removed.

## 2024-02-25 NOTE — TOC Transition Note (Signed)
 Transition of Care [facility]) - Discharge Note   Patient Details  Name: Johnathan Scott. MRN: [id number] Date of Birth: June 14, 1966  [facility]) CM/SW Contact:  Alexandra Ice, RN Phone Number: 02/25/2024, 1:03 PM   Clinical Narrative:     Patient to discharge today.    Barriers to Discharge: Barriers Resolved   Patient Goals and CMS Choice        Expected Discharge Plan and Services           Expected Discharge Date: 02/25/24                 DME Agency: NA       HH Arranged: NA          Prior Living Arrangements/Services                       Activities of Daily Living   ADL Screening (condition at time of admission) Independently performs ADLs?: No Does the patient have a NEW difficulty with bathing/dressing/toileting/self-feeding that is expected to last >3 days?: No Does the patient have a NEW difficulty with getting in/out of bed, walking, or climbing stairs that is expected to last >3 days?: No Does the patient have a NEW difficulty with communication that is expected to last >3 days?: No Is the patient deaf or have difficulty hearing?: Yes Does the patient have difficulty seeing, even when wearing glasses/contacts?: Yes (right eye) Does the patient have difficulty concentrating, remembering, or making decisions?: Yes  Permission Sought/Granted                  Emotional Assessment              Admission diagnosis:  Cellulitis of left lower extremity [L03.116] Left leg cellulitis [L03.116] Patient Active Problem List   Diagnosis Date Noted   Cellulitis of left lower extremity 02/24/2024   Hypokalemia 02/24/2024   Essential hypertension 02/24/2024   Depression, major, single episode, severe (HCC) 12/05/2022   Left hand weakness 02/17/2020   Chronic joint pain 02/17/2020   Abnormal PSA 02/17/2020   Nocturia 02/17/2020   Venous ulcer of ankle (HCC) 01/11/2020   Chronic venous insufficiency 01/11/2020    Vitamin D  deficiency 01/11/2020   HLD (hyperlipidemia) 01/11/2020   Iron deficiency 01/11/2020   Lymphedema 01/10/2020   Low back pain without sciatica 01/10/2020   Poor vision 12/10/2018   Left arm pain 12/10/2018   Bilateral hip pain 11/22/2018   HTN (hypertension) 08/09/2018   Anemia 02/20/2016   Bilateral lower extremity edema 01/20/2016   Palpitations 01/20/2016   Panic attacks 01/20/2016   PCP:  Penne Bowl, MD Pharmacy:   [id number] [place], [place] - [address] AT [facility] OF [address] [place] [address] Phone: [phone] Fax: [phone]  [facility] [address] [place] [address] Phone: [phone] Fax: [phone]     Social Determinants of Health (SDOH) Interventions    Readmission Risk Interventions     No data to display           Final next level of care: Home/Self Care Barriers to Discharge: Barriers Resolved   Patient Goals and CMS Choice            Discharge Placement                    Patient and family notified  of of transfer: 02/25/24  Discharge Plan and Services Additional resources added to the After Visit Summary for                    DME Agency: NA       HH Arranged: NA          Social Drivers of Health (SDOH) Interventions SDOH Screenings   Food Insecurity: No Food Insecurity (02/24/2024)  Housing: High Risk (02/24/2024)  Transportation Needs: Unmet Transportation Needs (02/24/2024)  Utilities: Not At Risk (02/24/2024)  Depression (PHQ2-9): High Risk (12/03/2022)  Social Connections: Socially Isolated (02/24/2024)  Tobacco Use: Low Risk  (02/23/2024)   Received from [facility]     Readmission Risk Interventions     No data to display

## 2024-02-25 NOTE — Progress Notes (Signed)
 Pharmacy Antibiotic Note  Johnathan Doo. is a 58 y.o. male admitted on 02/23/2024 with cellulitis.  Patient presented to ED with acute onset of LLE swelling with erythema, warmth, and pain over the past 3 days. Denies any purulent drainage or bleeding from ulcer. Pharmacy has been consulted for vancomycin  dosing.  Plan: Adjust vancomycin  to vancomycin  750 mg IV Q24H. Goal AUC 400-550. Expected AUC: 482.9 Expected Css min: 13.4 SCr used: 1.61  Weight used: IBW, Vd used: 0.5 (BMI 37.66) Continue to monitor renal function and follow culture results   Height: 5\' 1"  (154.9 cm) Weight: 90.4 kg (199 lb 4.7 oz) IBW/kg (Calculated) : 52.3  Temp (24hrs), Avg:97.9 F (36.6 C), Min:97.6 F (36.4 C), Max:98.2 F (36.8 C)  Recent Labs  Lab 02/23/24 1308 02/24/24 0606 02/25/24 0242  WBC 11.2* 8.1 6.8  CREATININE 2.01* 1.94* 1.61*  LATICACIDVEN 1.0  --   --     Estimated Creatinine Clearance: 46.6 mL/min (A) (by C-G formula based on SCr of 1.61 mg/dL (H)).    Allergies  Allergen Reactions   Novocain [Procaine] Other (See Comments)    Panic attack   Cortisone Rash   Hctz [Hydrochlorothiazide ] Rash    Antimicrobials this admission: 5/6 Ceftriaxone x 1 5/7 Cefazolin x 1 5/7 Vanc>>   Microbiology results: 5/6 BCx: NGTD   Thank you for allowing pharmacy to be a part of this patient's care.  Alice Innocent, PharmD, BCPS 02/25/2024 8:34 AM

## 2024-02-25 NOTE — Discharge Summary (Signed)
 Physician Discharge Summary   Patient: Johnathan Scott. MRN: [id number] DOB: 05-09-1966  Admit date:     02/23/2024  Discharge date: 02/25/24  Discharge Physician: Brenna Cam   PCP: Penne Bowl, MD   Recommendations at discharge:    F/up with outpt providers as requested  Discharge Diagnoses: Principal Problem:   Cellulitis of left lower extremity Active Problems:   Hypokalemia   Essential hypertension  Hospital Course: 58 y.o.  male with medical history significant for asthma and chickenpox admitted for cellulitis Assessment and Plan: * Cellulitis of left lower extremity - improved with treatment and at baseline now.  Hypokalemia - repleted  Essential hypertension - controlled          Disposition: Home Diet recommendation:  Discharge Diet Orders (From admission, onward)     Start     Ordered   02/25/24 0000  Diet - low sodium heart healthy        02/25/24 1245           Carb modified diet DISCHARGE MEDICATION: Allergies as of 02/25/2024       Reactions   Novocain [procaine] Other (See Comments)   Panic attack   Cortisone Rash   Hctz [hydrochlorothiazide ] Rash        Medication List     STOP taking these medications    amLODipine  5 MG tablet Commonly known as: Norvasc    carvedilol  6.25 MG tablet Commonly known as: COREG    Cholecalciferol  1.25 MG (50000 UT) capsule   CYCLOBENZAPRINE  20 TD   IBUPROFEN PO   NORVASC  PO   predniSONE  1 MG tablet Commonly known as: DELTASONE        TAKE these medications    acetaminophen  500 MG tablet Commonly known as: TYLENOL  Take 1,000 mg by mouth every 6 (six) hours as needed.   cephALEXin  500 MG capsule Commonly known as: KEFLEX  Take 1 capsule (500 mg total) by mouth 3 (three) times daily for 5 days.               Discharge Care Instructions  (From admission, onward)           Start     Ordered   02/25/24 0000  Discharge wound care:       Comments: As above   02/25/24  1245            Follow-up Information     Penne Bowl, MD. Schedule an appointment as soon as possible for a visit in 1 week(s).   Specialty: Family Medicine Why: [facility] Discharge F/UP Contact information: [address] [place] [address] [phone]         [facility]. Schedule an appointment as soon as possible for a visit in 1 week(s).   Specialty: Wound Care Why: [facility] Discharge F/UP APPOINTMENT IS JUNE 4TH 8:30 am Contact information: [address] [phone]               Discharge Exam: Cleavon Curls Weights   02/23/24 1241  Weight: 90.4 kg   GENERAL:  58 y.o.-year-old male patient lying in the bed with no acute distress.  EYES: Pupils equal, round, reactive to light and accommodation. No scleral icterus. Extraocular muscles intact.  HEENT: Head atraumatic, normocephalic. Oropharynx and nasopharynx clear.  NECK:  Supple, no jugular venous distention. No thyroid  enlargement, no tenderness.  LUNGS: Normal breath sounds bilaterally, no wheezing, rales,rhonchi or crepitation. No use of accessory muscles of respiration.  CARDIOVASCULAR:  Regular rate and rhythm, S1, S2 normal. No murmurs, rubs, or gallops.  ABDOMEN: Soft, nondistended, nontender. Bowel sounds present. No organomegaly or mass.  EXTREMITIES: No pedal edema, cyanosis, or clubbing.  NEUROLOGIC: Cranial nerves II through XII are intact. Muscle strength 5/5 in all extremities. Sensation intact. Gait not checked.  PSYCHIATRIC: The patient is alert and oriented x 3.  Normal affect and good eye contact. SKIN: mild left lower extremity erythema - left lower leg ulcer and dried bloody scab at the base without purulent drainage.  Condition at discharge: fair  The results of significant diagnostics from this hospitalization (including imaging, microbiology, ancillary and laboratory) are listed below for reference.   Imaging  Studies: US  Venous Img Lower  Left (DVT Study) Result Date: 02/23/2024 CLINICAL DATA:  Left leg swelling EXAM: LEFT LOWER EXTREMITY VENOUS DOPPLER ULTRASOUND TECHNIQUE: Gray-scale sonography with graded compression, as well as color Doppler and duplex ultrasound were performed to evaluate the lower extremity deep venous systems from the level of the common femoral vein and including the common femoral, femoral, profunda femoral, popliteal and calf veins including the posterior tibial, peroneal and gastrocnemius veins when visible. The superficial great saphenous vein was also interrogated. Spectral Doppler was utilized to evaluate flow at rest and with distal augmentation maneuvers in the common femoral, femoral and popliteal veins. COMPARISON:  None Available. FINDINGS: Contralateral Common Femoral Vein: Respiratory phasicity is normal and symmetric with the symptomatic side. No evidence of thrombus. Normal compressibility. Common Femoral Vein: No evidence of thrombus. Normal compressibility, respiratory phasicity and response to augmentation. Saphenofemoral Junction: No evidence of thrombus. Normal compressibility and flow on color Doppler imaging. Profunda Femoral Vein: No evidence of thrombus. Normal compressibility and flow on color Doppler imaging. Femoral Vein: No evidence of thrombus. Normal compressibility, respiratory phasicity and response to augmentation. Popliteal Vein: No evidence of thrombus. Normal compressibility, respiratory phasicity and response to augmentation. Calf Veins: No evidence of thrombus. Normal compressibility and flow on color Doppler imaging. Superficial Great Saphenous Vein: No evidence of thrombus. Normal compressibility. Venous Reflux:  None. Other Findings: Prominent left inguinal lymph node with normal fatty hilus. This likely reactive in nature. IMPRESSION: No evidence of deep venous thrombosis. Electronically Signed   By: Violeta Grey M.D.   On: 02/23/2024 22:56     Microbiology: Results for orders placed or performed during the hospital encounter of 02/23/24  Blood culture (single)     Status: None (Preliminary result)   Collection Time: 02/23/24 11:13 PM   Specimen: BLOOD  Result Value Ref Range Status   Specimen Description BLOOD BLOOD RIGHT ARM  Final   Special Requests   Final    BOTTLES DRAWN AEROBIC AND ANAEROBIC Blood Culture adequate volume   Culture   Final    NO GROWTH 2 DAYS Performed at [facility], [address], [place], [place] [address]    Report Status PENDING  Incomplete  C Difficile Quick Screen w PCR reflex     Status: None   Collection Time: 02/24/24  5:30 PM   Specimen: STOOL  Result Value Ref Range Status   C Diff antigen NEGATIVE NEGATIVE Final   C Diff toxin NEGATIVE NEGATIVE Final   C Diff interpretation No C. difficile detected.  Final    Comment: Performed at [facility], [address] [place], [place] [address]    Labs: CBC: Recent Labs  Lab 02/23/24 1308 02/24/24 0606 02/25/24 0242  WBC 11.2* 8.1 6.8  HGB 11.3* 10.8* 10.1*  HCT  34.6* 33.0* 31.1*  MCV 90.1 88.9 90.9  PLT 158 161 167   Basic Metabolic Panel: Recent Labs  Lab 02/23/24 1308 02/24/24 0606 02/24/24 1353 02/25/24 0242  NA 135 137  --  140  K 3.2* 2.5* 3.8 3.5  CL 104 105  --  110  CO2 18* 22  --  21*  GLUCOSE 99 139*  --  114*  BUN 36* 38*  --  37*  CREATININE 2.01* 1.94*  --  1.61*  CALCIUM 8.9 8.5*  --  8.7*  MG  --  2.3  --  2.3  PHOS  --   --   --  3.0   Liver Function Tests: Recent Labs  Lab 02/23/24 1308 02/25/24 0242  AST 44*  --   ALT 45*  --   ALKPHOS 54  --   BILITOT 0.7  --   PROT 7.6  --   ALBUMIN 3.4* 2.6*   CBG: No results for input(s): "GLUCAP" in the last 168 hours.  Discharge time spent: greater than 30 minutes.  Signed: Brenna Cam, MD [facility] 02/25/2024

## 2024-02-25 NOTE — Plan of Care (Signed)

## 2024-02-28 LAB — CULTURE, BLOOD (SINGLE)
Culture: NO GROWTH
Special Requests: ADEQUATE

## 2024-04-18 ENCOUNTER — Other Ambulatory Visit (HOSPITAL_COMMUNITY): Payer: Self-pay

## 2024-05-20 ENCOUNTER — Other Ambulatory Visit (HOSPITAL_COMMUNITY): Payer: Self-pay

## 2024-08-11 ENCOUNTER — Other Ambulatory Visit (HOSPITAL_COMMUNITY): Payer: Self-pay
